# Patient Record
Sex: Female | Born: 1940
Health system: Southern US, Community
[De-identification: ages and names within clinical notes are randomized; demographics above are authoritative.]

## PROBLEM LIST (undated history)

## (undated) DIAGNOSIS — R011 Cardiac murmur, unspecified: Secondary | ICD-10-CM

## (undated) DIAGNOSIS — J189 Pneumonia, unspecified organism: Secondary | ICD-10-CM

## (undated) DIAGNOSIS — T7840XA Allergy, unspecified, initial encounter: Secondary | ICD-10-CM

## (undated) DIAGNOSIS — N189 Chronic kidney disease, unspecified: Secondary | ICD-10-CM

## (undated) DIAGNOSIS — E119 Type 2 diabetes mellitus without complications: Secondary | ICD-10-CM

## (undated) DIAGNOSIS — Z86010 Personal history of colonic polyps: Principal | ICD-10-CM

## (undated) DIAGNOSIS — T884XXA Failed or difficult intubation, initial encounter: Secondary | ICD-10-CM

## (undated) DIAGNOSIS — K219 Gastro-esophageal reflux disease without esophagitis: Secondary | ICD-10-CM

## (undated) DIAGNOSIS — M1990 Unspecified osteoarthritis, unspecified site: Secondary | ICD-10-CM

## (undated) DIAGNOSIS — I10 Essential (primary) hypertension: Secondary | ICD-10-CM

## (undated) DIAGNOSIS — E785 Hyperlipidemia, unspecified: Secondary | ICD-10-CM

## (undated) DIAGNOSIS — G709 Myoneural disorder, unspecified: Secondary | ICD-10-CM

## (undated) HISTORY — PX: POLYPECTOMY: SHX149

## (undated) HISTORY — DX: Allergy, unspecified, initial encounter: T78.40XA

## (undated) HISTORY — PX: ABDOMINAL HYSTERECTOMY: SHX81

## (undated) HISTORY — PX: TONSILLECTOMY: SUR1361

## (undated) HISTORY — PX: APPENDECTOMY: SHX54

## (undated) HISTORY — PX: BREAST EXCISIONAL BIOPSY: SUR124

## (undated) HISTORY — DX: Type 2 diabetes mellitus without complications: E11.9

## (undated) HISTORY — PX: BREAST SURGERY: SHX581

## (undated) HISTORY — PX: JOINT REPLACEMENT: SHX530

## (undated) HISTORY — DX: Personal history of colonic polyps: Z86.010

## (undated) HISTORY — DX: Unspecified osteoarthritis, unspecified site: M19.90

## (undated) HISTORY — DX: Hyperlipidemia, unspecified: E78.5

## (undated) HISTORY — DX: Essential (primary) hypertension: I10

---

## 1998-09-26 ENCOUNTER — Ambulatory Visit (HOSPITAL_COMMUNITY): Admission: RE | Admit: 1998-09-26 | Discharge: 1998-09-26 | Payer: Self-pay | Admitting: Obstetrics and Gynecology

## 1999-02-09 ENCOUNTER — Ambulatory Visit (HOSPITAL_COMMUNITY): Admission: RE | Admit: 1999-02-09 | Discharge: 1999-02-09 | Payer: Self-pay | Admitting: Cardiovascular Disease

## 2000-02-15 ENCOUNTER — Other Ambulatory Visit: Admission: RE | Admit: 2000-02-15 | Discharge: 2000-02-15 | Payer: Self-pay | Admitting: *Deleted

## 2000-09-03 ENCOUNTER — Ambulatory Visit (HOSPITAL_BASED_OUTPATIENT_CLINIC_OR_DEPARTMENT_OTHER): Admission: RE | Admit: 2000-09-03 | Discharge: 2000-09-03 | Payer: Self-pay | Admitting: Otolaryngology

## 2001-01-14 ENCOUNTER — Encounter: Payer: Self-pay | Admitting: Obstetrics and Gynecology

## 2001-01-14 ENCOUNTER — Ambulatory Visit (HOSPITAL_COMMUNITY): Admission: RE | Admit: 2001-01-14 | Discharge: 2001-01-14 | Payer: Self-pay | Admitting: Obstetrics and Gynecology

## 2001-08-04 ENCOUNTER — Ambulatory Visit (HOSPITAL_COMMUNITY): Admission: RE | Admit: 2001-08-04 | Discharge: 2001-08-04 | Payer: Self-pay | Admitting: Cardiovascular Disease

## 2001-08-04 ENCOUNTER — Encounter: Payer: Self-pay | Admitting: Cardiovascular Disease

## 2001-08-13 ENCOUNTER — Other Ambulatory Visit: Admission: RE | Admit: 2001-08-13 | Discharge: 2001-08-13 | Payer: Self-pay | Admitting: Obstetrics and Gynecology

## 2003-07-20 ENCOUNTER — Ambulatory Visit (HOSPITAL_COMMUNITY): Admission: RE | Admit: 2003-07-20 | Discharge: 2003-07-20 | Payer: Self-pay | Admitting: Obstetrics

## 2003-07-20 ENCOUNTER — Encounter: Payer: Self-pay | Admitting: Obstetrics

## 2004-07-16 ENCOUNTER — Ambulatory Visit (HOSPITAL_COMMUNITY): Admission: RE | Admit: 2004-07-16 | Discharge: 2004-07-16 | Payer: Self-pay | Admitting: Cardiovascular Disease

## 2005-02-11 ENCOUNTER — Ambulatory Visit (HOSPITAL_COMMUNITY): Admission: RE | Admit: 2005-02-11 | Discharge: 2005-02-11 | Payer: Self-pay | Admitting: Obstetrics

## 2006-04-30 ENCOUNTER — Ambulatory Visit (HOSPITAL_COMMUNITY): Admission: RE | Admit: 2006-04-30 | Discharge: 2006-04-30 | Payer: Self-pay | Admitting: Obstetrics

## 2008-03-21 ENCOUNTER — Encounter: Admission: RE | Admit: 2008-03-21 | Discharge: 2008-03-21 | Payer: Self-pay | Admitting: Cardiovascular Disease

## 2008-04-19 ENCOUNTER — Telehealth: Payer: Self-pay | Admitting: Internal Medicine

## 2008-04-27 ENCOUNTER — Ambulatory Visit (HOSPITAL_COMMUNITY): Admission: RE | Admit: 2008-04-27 | Discharge: 2008-04-27 | Payer: Self-pay | Admitting: Obstetrics & Gynecology

## 2008-05-19 ENCOUNTER — Ambulatory Visit: Payer: Self-pay | Admitting: Internal Medicine

## 2008-05-26 ENCOUNTER — Ambulatory Visit: Payer: Self-pay | Admitting: Internal Medicine

## 2008-05-26 ENCOUNTER — Encounter: Payer: Self-pay | Admitting: Internal Medicine

## 2008-05-26 DIAGNOSIS — Z86010 Personal history of colonic polyps: Secondary | ICD-10-CM

## 2008-05-26 DIAGNOSIS — Z860101 Personal history of adenomatous and serrated colon polyps: Secondary | ICD-10-CM

## 2008-05-26 HISTORY — DX: Personal history of colonic polyps: Z86.010

## 2008-05-26 HISTORY — DX: Personal history of adenomatous and serrated colon polyps: Z86.0101

## 2008-05-31 ENCOUNTER — Encounter: Payer: Self-pay | Admitting: Internal Medicine

## 2008-06-07 ENCOUNTER — Encounter: Admission: RE | Admit: 2008-06-07 | Discharge: 2008-06-07 | Payer: Self-pay | Admitting: Cardiovascular Disease

## 2009-05-19 ENCOUNTER — Encounter (INDEPENDENT_AMBULATORY_CARE_PROVIDER_SITE_OTHER): Payer: Self-pay | Admitting: *Deleted

## 2009-05-30 ENCOUNTER — Ambulatory Visit (HOSPITAL_COMMUNITY): Admission: RE | Admit: 2009-05-30 | Discharge: 2009-05-30 | Payer: Self-pay | Admitting: Cardiovascular Disease

## 2009-06-05 ENCOUNTER — Encounter: Admission: RE | Admit: 2009-06-05 | Discharge: 2009-06-05 | Payer: Self-pay | Admitting: Cardiovascular Disease

## 2009-06-08 ENCOUNTER — Ambulatory Visit: Payer: Self-pay | Admitting: Internal Medicine

## 2009-06-21 ENCOUNTER — Ambulatory Visit: Payer: Self-pay | Admitting: Internal Medicine

## 2010-06-07 ENCOUNTER — Encounter: Admission: RE | Admit: 2010-06-07 | Discharge: 2010-06-07 | Payer: Self-pay | Admitting: Obstetrics & Gynecology

## 2010-11-06 ENCOUNTER — Encounter: Payer: Self-pay | Admitting: Internal Medicine

## 2010-11-07 ENCOUNTER — Telehealth (INDEPENDENT_AMBULATORY_CARE_PROVIDER_SITE_OTHER): Payer: Self-pay | Admitting: *Deleted

## 2010-12-20 NOTE — Progress Notes (Signed)
  Phone Note Other Incoming   Request: Send information Summary of Call: Request received from [facility] forwarded to [facility].       

## 2011-01-29 ENCOUNTER — Other Ambulatory Visit: Payer: Self-pay | Admitting: Cardiovascular Disease

## 2011-01-29 DIAGNOSIS — M542 Cervicalgia: Secondary | ICD-10-CM

## 2011-02-01 ENCOUNTER — Ambulatory Visit
Admission: RE | Admit: 2011-02-01 | Discharge: 2011-02-01 | Disposition: A | Discharge status: Home / Self Care | Payer: Medicare Other | Source: Ambulatory Visit | Attending: Cardiovascular Disease | Admitting: Cardiovascular Disease

## 2011-02-01 DIAGNOSIS — M542 Cervicalgia: Secondary | ICD-10-CM

## 2011-04-03 ENCOUNTER — Ambulatory Visit: Payer: Medicare Other | Attending: Neurosurgery | Admitting: Rehabilitative and Restorative Service Providers"

## 2011-04-03 DIAGNOSIS — M542 Cervicalgia: Secondary | ICD-10-CM | POA: Insufficient documentation

## 2011-04-03 DIAGNOSIS — IMO0001 Reserved for inherently not codable concepts without codable children: Secondary | ICD-10-CM | POA: Insufficient documentation

## 2011-04-09 ENCOUNTER — Ambulatory Visit: Payer: Medicare Other | Admitting: Physical Therapy

## 2011-04-12 ENCOUNTER — Encounter: Payer: Medicare Other | Admitting: Rehabilitative and Restorative Service Providers"

## 2011-04-23 ENCOUNTER — Encounter: Payer: Medicare Other | Admitting: Physical Therapy

## 2011-05-13 NOTE — Miscellaneous (Signed)
Summary: PT CHANGED GI DR  Per Medical Records, patient has changed GI Dr's as per Release of Information Request. Dottie Nelson-Smith CMA Duncan Dull)  November 06, 2010 10:08 AM

## 2011-05-24 ENCOUNTER — Other Ambulatory Visit: Payer: Self-pay | Admitting: Cardiovascular Disease

## 2011-05-24 DIAGNOSIS — Z1231 Encounter for screening mammogram for malignant neoplasm of breast: Secondary | ICD-10-CM

## 2011-06-11 ENCOUNTER — Ambulatory Visit
Admission: RE | Admit: 2011-06-11 | Discharge: 2011-06-11 | Disposition: A | Discharge status: Home / Self Care | Payer: Medicare Other | Source: Ambulatory Visit | Attending: Cardiovascular Disease | Admitting: Cardiovascular Disease

## 2011-06-11 DIAGNOSIS — Z1231 Encounter for screening mammogram for malignant neoplasm of breast: Secondary | ICD-10-CM

## 2012-06-03 ENCOUNTER — Other Ambulatory Visit: Payer: Self-pay | Admitting: Cardiovascular Disease

## 2012-06-03 DIAGNOSIS — Z1231 Encounter for screening mammogram for malignant neoplasm of breast: Secondary | ICD-10-CM

## 2012-06-23 ENCOUNTER — Ambulatory Visit
Admission: RE | Admit: 2012-06-23 | Discharge: 2012-06-23 | Disposition: A | Discharge status: Home / Self Care | Payer: Medicare Other | Source: Ambulatory Visit | Attending: Cardiovascular Disease | Admitting: Cardiovascular Disease

## 2012-06-23 DIAGNOSIS — Z1231 Encounter for screening mammogram for malignant neoplasm of breast: Secondary | ICD-10-CM

## 2012-08-03 ENCOUNTER — Ambulatory Visit (INDEPENDENT_AMBULATORY_CARE_PROVIDER_SITE_OTHER): Payer: Medicare Other | Admitting: Emergency Medicine

## 2012-08-03 VITALS — BP 187/96 | HR 106 | Temp 97.8°F | Resp 18 | Ht 66.0 in | Wt 165.0 lb

## 2012-08-03 DIAGNOSIS — I10 Essential (primary) hypertension: Secondary | ICD-10-CM

## 2012-08-03 DIAGNOSIS — Z23 Encounter for immunization: Secondary | ICD-10-CM

## 2012-08-03 DIAGNOSIS — S91109A Unspecified open wound of unspecified toe(s) without damage to nail, initial encounter: Secondary | ICD-10-CM

## 2012-08-03 DIAGNOSIS — M25549 Pain in joints of unspecified hand: Secondary | ICD-10-CM

## 2012-08-03 NOTE — Progress Notes (Signed)
  Subjective:    Patient ID: Felicia Shelton, female    DOB: [birth year], 71 y.o.   MRN: [id number]  HPI patient enters with pain and swelling in the right great toe. She was helping move furniture and caught the nail plate beneath the bottom of a piece of furniture and pulled the nail plate backward    Review of Systems     Objective:   Physical Exam the nail plate of the right great toe was pulled backwards but still attached at the base. There is no swelling of the toe itself. There is no active bleeding the        Assessment & Plan:  She has a partial avulsion of the nail plate of the right great toe. We'll go ahead and remove the nail plate. Tetanus will be updated as a DT booster.

## 2012-08-03 NOTE — Patient Instructions (Signed)
Patient to take your blood pressure medication when you get home. Please be sure you followup with your regular doctor that her blood pressure.

## 2012-08-03 NOTE — Progress Notes (Signed)
   Patient ID: Felicia Shelton MRN: [id number], DOB: 01/31/1941, 71 y.o. Date of Encounter: 08/03/2012, 4:01 PM   PROCEDURE NOTE: Verbal consent obtained. Sterile technique employed. Numbing: Anesthesia obtained with 1:1 ratio 2% plain lidocaine with 0.5% Marcaine  Betadine prep per usual protocol.  Total nail already lifted due to the nature outside of the proximal nail. Proximal nail loosened and easily removed. Proximal aspect of nail bed explored revealing no nail remnants. Irrigated. Xeroform dressing applied. Wound care instructions including precautions covered with patient. Handout given.   SignedEula Listen, PA-C 08/03/2012 4:01 PM

## 2012-08-04 DIAGNOSIS — I10 Essential (primary) hypertension: Secondary | ICD-10-CM | POA: Insufficient documentation

## 2012-09-04 ENCOUNTER — Other Ambulatory Visit: Payer: Self-pay | Admitting: Gastroenterology

## 2012-09-09 ENCOUNTER — Ambulatory Visit
Admission: RE | Admit: 2012-09-09 | Discharge: 2012-09-09 | Disposition: A | Discharge status: Home / Self Care | Payer: Medicare Other | Source: Ambulatory Visit | Attending: Cardiovascular Disease | Admitting: Cardiovascular Disease

## 2012-09-09 ENCOUNTER — Other Ambulatory Visit: Payer: Self-pay | Admitting: Cardiovascular Disease

## 2012-09-09 DIAGNOSIS — R52 Pain, unspecified: Secondary | ICD-10-CM

## 2012-09-09 DIAGNOSIS — M1990 Unspecified osteoarthritis, unspecified site: Secondary | ICD-10-CM

## 2013-01-24 ENCOUNTER — Ambulatory Visit (INDEPENDENT_AMBULATORY_CARE_PROVIDER_SITE_OTHER): Payer: Medicare Other | Admitting: Family Medicine

## 2013-01-24 VITALS — BP 126/78 | HR 70 | Temp 97.9°F | Resp 16 | Ht 65.5 in | Wt 159.4 lb

## 2013-01-24 DIAGNOSIS — J309 Allergic rhinitis, unspecified: Secondary | ICD-10-CM

## 2013-01-24 MED ORDER — FLUTICASONE PROPIONATE 50 MCG/ACT NA SUSP: 2.0000 | Freq: Every day | NASAL | Status: DC

## 2013-01-24 NOTE — Progress Notes (Signed)
Subjective: 72 year old lady with a history of fairly recently been diagnosed as being diabetic. Her sugars are coming down as they increase her medications. She's been having recently had congestion and a lot of sneezing. She is not particularly ill. She's not coughing much. She's not feverish.  Objective: Pleasant alert lady in no major distress. TMs normal. Sinuses not not particularly tender. Nose slightly inflamed. Throat was clear. Neck supple without significant nodes. Chest is clear to auscultation. Since being diagnosed as being diabetic patient has been working on getting herself off of cigarettes, and is down to one or 2 cigarettes a day. She was urged to discontinue these.  Assessment: Allergic rhinitis  Plan: Continue to quit the cigarettes. Allegra Nasal saline or Nettie pot Fluticasone spray

## 2013-01-24 NOTE — Progress Notes (Signed)
  Subjective:    Patient ID: Felicia Shelton, female    DOB: 05-Feb-1941, 72 y.o.   MRN: [id number]  HPI    Review of Systems     Objective:   Physical Exam        Assessment & Plan:

## 2013-01-24 NOTE — Patient Instructions (Addendum)
Drink lots of fluids  Use the fluticasone spray 2 sprays twice daily for 3 days, then cut back to once daily  Use saline spray or Nettie pot as needed  Take over-the-counter or Allegra  Return if worse

## 2013-03-03 ENCOUNTER — Encounter: Payer: Self-pay | Admitting: Dietician

## 2013-03-03 ENCOUNTER — Encounter: Payer: Medicare Other | Attending: Cardiovascular Disease | Admitting: Dietician

## 2013-03-03 VITALS — Ht 65.5 in | Wt 152.7 lb

## 2013-03-03 DIAGNOSIS — Z713 Dietary counseling and surveillance: Secondary | ICD-10-CM | POA: Insufficient documentation

## 2013-03-03 DIAGNOSIS — E119 Type 2 diabetes mellitus without complications: Secondary | ICD-10-CM | POA: Insufficient documentation

## 2013-03-03 NOTE — Progress Notes (Signed)
Medical Nutrition Therapy:  Appt start time: 1115end time:  1150.   Assessment:  Primary concerns today: Came to know what to eat, if I can eat any carbs.  Currently lean meat and non-starchy vegetables ans losing weight.  Has lost approximately 8-10 lb since her diagnosis.  Her current weight is at 152 lb with a BMI of 25.0 kg/m2.  She expresses a desire to not lose any more weight. Does agree that weight loss to high 140's for the sake of better blood glucose would be OK.  Current A1C is at 6.9% on (12/11/2012).     MEDICATIONS: Med review completed.  Using Metformin 250 mg 3 times a day for blood glucose control.  BLOOD GLUCOSE MONITORING:  Daily fasting levels.  Fasting: 92, 95, 116- 129, 132   (Notes she has discovered that her evening meal can influence her fasting glucose levels)  DAILY FOOT SELF-EXAM:  Daily examines with her shower.  Review of the things to look for.  DILATED EYE EXAM:  No  HYPERGLYCEMIA:  No S/S noted.  HYPOGLYCEMIA:  No S/S noted.   DIETARY INTAKE:  Usual eating pattern includes 3 meals and 1 snacks per day (requesting snacks and snack suggestions).  Everyday foods include protein, non-starchy vegetables and more complex starches.  Avoided foods include sugary beverages and concentrated sweets, bread, pasta, rice.    24-hr recall:  B ( AM): 9:00-9:30  Oatmeal (instant with no sugar or less sugar) and a banana.  OR hamburger (1/2)  with brown bread (1 slice) unsweetened tea or water  Snk ( AM): none  L ( PM): 12-1:00 hotdog all the way, chili, slaw, mustard, OR [employer] Coldcut Combo, on wheat bread.  Tea, or diet soda. Snk ( PM): none D ( PM): 5:00 baked chicken and broccoli and salad and broccoli and another veggie Snk ( PM): none Beverages: water, unsweetened tea, diet soda.  Usual physical activity: none   Estimated energy needs:  HT: 65.5 in   WT: 152.1 lb  BMI: 25.0 kg/m2  Adj WT: 135 lb  (62) 1300-1400 calories 150-155 g carbohydrates 100-105 g  protein 36-38 g fat  Progress Towards Goal(s):  In progress.   Nutritional Diagnosis:  Slater-2.1 Inpaired nutrition utilization As related to glucose.  As evidenced by diagnosis of type 2 diabetes, A1C at 6.9%, fasting glucose > 126 mg at times, Metformin to assist with glucose control..    Intervention: Nutrition .   Try to get in the 150 minutes of activity each week.  Walking is the best.  Aim to start low and slow and work up to the 150 minutes.  Can be done outside or on your treadmill. Start to read food labels. Aim for fiber in most of your foods.  Look to use the whole grains.  Needs to list whole wheat or other whole grain flour in the ingredient list.  Aim for 2 gm of fiber per slice of whole grain breads. Aim to keep the sugar content on the label at (0-9 gm per serving) for the majority of the day.  Try measuring your carb servings.  Try to use the beans, peas, lentils, sweet potato.  Rice, pasta, white potatoes and white breads go to glucose relatively quickly. Have only one starch at a meal.  Plan for a protein and 1-2 servings of the non-starchy vegetables for the meal. Use the lean proteins at all meals and snacks. Serving size at size of the palm of the hand and for  snacks about 1-2 oz.  Use the www.calorieking.com web site for assistance with food labels and carb counting.  Especially helpful when dining out. Keep the added fat servings to 1-2 added fat servings per meal. Use your food label for serving size and plan to measure Aim for a 5% (7 lb) weight loss over the next 4-6 months.  Continue to daily check your feet.  Get the dilated eye exam.  Experiment with checking the after meals blood glucose levels.  Try to aim for the 30gm of carb for meals and the snacks at 15 gm plus your protein and and small servings of added fat. Continue to omit the sweetened beverages. Continue to use the Splenda in moderation as you are doing.   GOAL:  "Control blood glucose and take no  medications and have as few complications as possible."  Handouts given during visit include:  Living Well with Diabetes  Novo Nordisk Carb Counting Guide  Controlling Blood Glucose  Yellow Card with Diet Prescription and Carb Exchange List  30 gm CHO menu   Monitoring/Evaluation:  Dietary intake, exercise, blood glucose, and body weight in 8-12 weeks.

## 2013-03-04 ENCOUNTER — Encounter: Payer: Self-pay | Admitting: Dietician

## 2013-05-17 ENCOUNTER — Other Ambulatory Visit: Payer: Self-pay

## 2013-05-17 DIAGNOSIS — Z1231 Encounter for screening mammogram for malignant neoplasm of breast: Secondary | ICD-10-CM

## 2013-06-24 ENCOUNTER — Ambulatory Visit
Admission: RE | Admit: 2013-06-24 | Discharge: 2013-06-24 | Disposition: A | Discharge status: Home / Self Care | Payer: Medicare Other | Source: Ambulatory Visit

## 2013-06-24 DIAGNOSIS — Z1231 Encounter for screening mammogram for malignant neoplasm of breast: Secondary | ICD-10-CM

## 2014-04-19 ENCOUNTER — Ambulatory Visit (INDEPENDENT_AMBULATORY_CARE_PROVIDER_SITE_OTHER): Payer: Medicare Other

## 2014-04-19 VITALS — BP 141/77 | HR 72 | Resp 16

## 2014-04-19 DIAGNOSIS — B351 Tinea unguium: Secondary | ICD-10-CM

## 2014-04-19 DIAGNOSIS — E1142 Type 2 diabetes mellitus with diabetic polyneuropathy: Secondary | ICD-10-CM

## 2014-04-19 DIAGNOSIS — E1149 Type 2 diabetes mellitus with other diabetic neurological complication: Secondary | ICD-10-CM

## 2014-04-19 DIAGNOSIS — E1140 Type 2 diabetes mellitus with diabetic neuropathy, unspecified: Secondary | ICD-10-CM

## 2014-04-19 DIAGNOSIS — M79609 Pain in unspecified limb: Secondary | ICD-10-CM

## 2014-04-19 NOTE — Patient Instructions (Addendum)
Diabetes and Foot Care Diabetes may cause you to have problems because of poor blood supply (circulation) to your feet and legs. This may cause the skin on your feet to become thinner, break easier, and heal more slowly. Your skin may become dry, and the skin may peel and crack. You may also have nerve damage in your legs and feet causing decreased feeling in them. You may not notice minor injuries to your feet that could lead to infections or more serious problems. Taking care of your feet is one of the most important things you can do for yourself.  HOME CARE INSTRUCTIONS  Wear shoes at all times, even in the house. Do not go barefoot. Bare feet are easily injured.  Check your feet daily for blisters, cuts, and redness. If you cannot see the bottom of your feet, use a mirror or ask someone for help.  Wash your feet with warm water (do not use hot water) and mild soap. Then pat your feet and the areas between your toes until they are completely dry. Do not soak your feet as this can dry your skin.  Apply a moisturizing lotion or petroleum jelly (that does not contain alcohol and is unscented) to the skin on your feet and to dry, brittle toenails. Do not apply lotion between your toes.  Trim your toenails straight across. Do not dig under them or around the cuticle. File the edges of your nails with an emery board or nail file.  Do not cut corns or calluses or try to remove them with medicine.  Wear clean socks or stockings every day. Make sure they are not too tight. Do not wear knee-high stockings since they may decrease blood flow to your legs.  Wear shoes that fit properly and have enough cushioning. To break in new shoes, wear them for just a few hours a day. This prevents you from injuring your feet. Always look in your shoes before you put them on to be sure there are no objects inside.  Do not cross your legs. This may decrease the blood flow to your feet.  If you find a minor scrape,  cut, or break in the skin on your feet, keep it and the skin around it clean and dry. These areas may be cleansed with mild soap and water. Do not cleanse the area with peroxide, alcohol, or iodine.  When you remove an adhesive bandage, be sure not to damage the skin around it.  If you have a wound, look at it several times a day to make sure it is healing.  Do not use heating pads or hot water bottles. They may burn your skin. If you have lost feeling in your feet or legs, you may not know it is happening until it is too late.  Make sure your health care provider performs a complete foot exam at least annually or more often if you have foot problems. Report any cuts, sores, or bruises to your health care provider immediately. SEEK MEDICAL CARE IF:   You have an injury that is not healing.  You have cuts or breaks in the skin.  You have an ingrown nail.  You notice redness on your legs or feet.  You feel burning or tingling in your legs or feet.  You have pain or cramps in your legs and feet.  Your legs or feet are numb.  Your feet always feel cold. SEEK IMMEDIATE MEDICAL CARE IF:   There is increasing redness,   swelling, or pain in or around a wound.  There is a red line that goes up your leg.  Pus is coming from a wound.  You develop a fever or as directed by your health care provider.  You notice a bad smell coming from an ulcer or wound. Document Released: 11/01/2000 Document Revised: 07/07/2013 Document Reviewed: 04/13/2013 [facility] Patient Information 2014 [place].   For the fungus nails to help maintain improvement use over-the-counter Fungi-Nail which can be obtained at any drugstore apply once or twice monthly as a preventative agent

## 2014-04-19 NOTE — Progress Notes (Signed)
   Subjective:    Patient ID: Felicia Shelton, female    DOB: 09-28-1941, 73 y.o.   MRN: [id number]  HPI Comments: "I just want this toenail checked and have the toenails cut. The toe kind of hurts"  Follow up 1st toenail right and trim the toenails     Review of Systems  All other systems reviewed and are negative.      Objective:   Physical Exam Neurovascular status is intact although diminished DP postal for PT plus one over 4 bilateral capillary refill time 3 seconds all digits epicritic and proprioceptive sensations intact and symmetric although decreased on Semmes Weinstein testing to the forefoot and plantar arch. Nails both hallux show some improvement has been using topical antifungal in the past obstetric suggested over-the-counter Fungi-Nail be applied to the affected nails as a preventative measure both nails show improvement no signs of significant fungus no exacerbation no secondary infection is noted patient requesting for diabetic foot and nail care at this time also suggested a proper shoe gear no slip on shoes no flip-flops no barefoot or spur socks at all times       Assessment & Plan:  Assessment resolving onychomycosis hallux nails thick brittle crumbly friable criptotic nails 1 through 4 bilateral debridement presence of diabetes cocking factors as well as onychomycosis and proptosis incurvation of nails maintain shoes with accommodation of her toes avoid entire constrictive on her toes maybe getting some contusions for some of her shoes wearing slip on shoes or not diabetic appropriate reappointed 3 months for followup and continued palliative care in the future as needed  Harriet Masson DPM

## 2014-06-02 ENCOUNTER — Other Ambulatory Visit: Payer: Self-pay

## 2014-06-02 DIAGNOSIS — Z1231 Encounter for screening mammogram for malignant neoplasm of breast: Secondary | ICD-10-CM

## 2014-06-28 ENCOUNTER — Ambulatory Visit
Admission: RE | Admit: 2014-06-28 | Discharge: 2014-06-28 | Disposition: A | Discharge status: Home / Self Care | Payer: Medicare Other | Source: Ambulatory Visit

## 2014-06-28 DIAGNOSIS — Z1231 Encounter for screening mammogram for malignant neoplasm of breast: Secondary | ICD-10-CM

## 2014-08-01 ENCOUNTER — Encounter: Payer: Self-pay | Admitting: Internal Medicine

## 2014-09-05 ENCOUNTER — Other Ambulatory Visit: Payer: Self-pay | Admitting: Orthopedic Surgery

## 2014-09-05 DIAGNOSIS — M79605 Pain in left leg: Secondary | ICD-10-CM

## 2014-09-05 DIAGNOSIS — M5416 Radiculopathy, lumbar region: Secondary | ICD-10-CM

## 2014-09-15 ENCOUNTER — Ambulatory Visit
Admission: RE | Admit: 2014-09-15 | Discharge: 2014-09-15 | Disposition: A | Discharge status: Home / Self Care | Payer: Medicare Other | Source: Ambulatory Visit | Attending: Orthopedic Surgery | Admitting: Orthopedic Surgery

## 2014-09-15 DIAGNOSIS — M79605 Pain in left leg: Secondary | ICD-10-CM

## 2014-09-15 DIAGNOSIS — M5416 Radiculopathy, lumbar region: Secondary | ICD-10-CM

## 2015-06-19 ENCOUNTER — Other Ambulatory Visit: Payer: Self-pay

## 2015-06-19 DIAGNOSIS — Z1231 Encounter for screening mammogram for malignant neoplasm of breast: Secondary | ICD-10-CM

## 2015-07-26 ENCOUNTER — Ambulatory Visit
Admission: RE | Admit: 2015-07-26 | Discharge: 2015-07-26 | Disposition: A | Discharge status: Home / Self Care | Payer: Medicare Other | Source: Ambulatory Visit

## 2015-07-26 DIAGNOSIS — Z1231 Encounter for screening mammogram for malignant neoplasm of breast: Secondary | ICD-10-CM

## 2015-07-28 ENCOUNTER — Other Ambulatory Visit: Payer: Self-pay | Admitting: Cardiovascular Disease

## 2015-07-28 DIAGNOSIS — R928 Other abnormal and inconclusive findings on diagnostic imaging of breast: Secondary | ICD-10-CM

## 2015-08-03 ENCOUNTER — Ambulatory Visit
Admission: RE | Admit: 2015-08-03 | Discharge: 2015-08-03 | Disposition: A | Discharge status: Home / Self Care | Payer: Medicare Other | Source: Ambulatory Visit | Attending: Cardiovascular Disease | Admitting: Cardiovascular Disease

## 2015-08-03 ENCOUNTER — Telehealth: Payer: Self-pay | Admitting: Internal Medicine

## 2015-08-03 DIAGNOSIS — R928 Other abnormal and inconclusive findings on diagnostic imaging of breast: Secondary | ICD-10-CM

## 2015-08-11 ENCOUNTER — Encounter: Payer: Self-pay | Admitting: Internal Medicine

## 2015-08-31 ENCOUNTER — Ambulatory Visit: Payer: Medicare Other | Admitting: Podiatry

## 2015-09-27 ENCOUNTER — Ambulatory Visit (AMBULATORY_SURGERY_CENTER): Payer: Self-pay | Admitting: *Deleted

## 2015-09-27 VITALS — Ht 66.0 in | Wt 168.0 lb

## 2015-09-27 DIAGNOSIS — Z86010 Personal history of colonic polyps: Secondary | ICD-10-CM

## 2015-09-27 NOTE — Progress Notes (Signed)
No egg or soy allergy  No anesthesia or intubation problems per pt  No diet medications taken  Registered in [facility]   

## 2015-10-05 ENCOUNTER — Encounter: Payer: Self-pay | Admitting: Internal Medicine

## 2015-10-11 ENCOUNTER — Encounter: Payer: Self-pay | Admitting: Internal Medicine

## 2015-10-11 ENCOUNTER — Ambulatory Visit (AMBULATORY_SURGERY_CENTER): Payer: Medicare Other | Admitting: Internal Medicine

## 2015-10-11 VITALS — BP 139/64 | HR 67 | Temp 97.8°F | Resp 32 | Ht 66.0 in | Wt 168.0 lb

## 2015-10-11 DIAGNOSIS — Z86010 Personal history of colonic polyps: Secondary | ICD-10-CM

## 2015-10-11 HISTORY — PX: COLONOSCOPY: SHX174

## 2015-10-11 MED ORDER — SODIUM CHLORIDE 0.9 % IV SOLN: 500.0000 mL | INTRAVENOUS | Status: DC

## 2015-10-11 MED ORDER — HYDROCORTISONE 2.5 % RE CREA: 1.0000 "application " | TOPICAL_CREAM | Freq: Two times a day (BID) | RECTAL | Status: DC | PRN

## 2015-10-11 NOTE — Progress Notes (Signed)
To recovery, report to Scott, RN, VSS 

## 2015-10-11 NOTE — Op Note (Signed)
[place]  [address] [place], [address]   COLONOSCOPY PROCEDURE REPORT  PATIENT: Felicia, Shelton  MR#: [id number] BIRTHDATE: 15-Aug-1941 , 73  yrs. old GENDER: female ENDOSCOPIST: Gatha Mayer, MD, [facility] PROCEDURE DATE:  10/11/2015 PROCEDURE:   Colonoscopy, surveillance First Screening Colonoscopy - Avg.  risk and is 50 yrs.  old or older - No.  Prior Negative Screening - Now for repeat screening. N/A  History of Adenoma - Now for follow-up colonoscopy & has been > or = to 3 yrs.  Yes hx of adenoma.  Has been 3 or more years since last colonoscopy.  Polyps removed today? No Recommend repeat exam, <10 yrs? Yes high risk ASA CLASS:   Class III INDICATIONS:Surveillance due to prior colonic neoplasia and PH Colon Adenoma. MEDICATIONS: Propofol 170 mg IV and Monitored anesthesia care  DESCRIPTION OF PROCEDURE:   After the risks benefits and alternatives of the procedure were thoroughly explained, informed consent was obtained.  The digital rectal exam revealed no abnormalities of the rectum.   The LB [id number]-[id number]  endoscope was introduced through the anus and advanced to the cecum, which was identified by both the appendix and ileocecal valve. No adverse events experienced.   The quality of the prep was excellent. (MiraLax was used)  The instrument was then slowly withdrawn as the colon was fully examined. Estimated blood loss is zero unless otherwise noted in this procedure report.      COLON FINDINGS: External and internal hemorrhoids were found.   The examination was otherwise normal.  Retroflexed views revealed internal hemorrhoids and Retroflexed views revealed external hemorrhoids. The time to cecum = 2.1 Withdrawal time = 8.1   The scope was withdrawn and the procedure completed. COMPLICATIONS: There were no immediate complications.  ENDOSCOPIC IMPRESSION: 1.   External and internal hemorrhoids 2.   The examination was otherwise  normal  RECOMMENDATIONS: Repeat Colonoscopy in 3 - 5 years. She may see me as needed to consider hemorrhoid banding if desired  eSigned:  Gatha Mayer, MD, [facility] 10/11/2015 10:22 AM   cc: The Patient and Dr. Jeanne Ivan

## 2015-10-11 NOTE — Patient Instructions (Addendum)
No polyps seen today. Repeat colonoscopy 3-5 years.  I refilled the hemorrhoid cream.  You may schedule an office appointment to discuss and consider rubber band treatment of your hemorrhoids if desired.  I appreciate the opportunity to care for you. Gatha Mayer, MD, [facility] FOLLOW DISCHARGE INSTRUCTIONS [facility] AND GREEN SHEETS).FOLLOW DISCHARGE INSTRUCTIONS (BLUE AND GREEN SHEETS).YOU HAD AN ENDOSCOPIC PROCEDURE TODAY AT [facility]:   Refer to the procedure report that was given to you for any specific questions about what was found during the examination.  If the procedure report does not answer your questions, please call your gastroenterologist to clarify.  If you requested that your care partner not be given the details of your procedure findings, then the procedure report has been included in a sealed envelope for you to review at your convenience later.  YOU SHOULD EXPECT: Some feelings of bloating in the abdomen. Passage of more gas than usual.  Walking can help get rid of the air that was put into your GI tract during the procedure and reduce the bloating. If you had a lower endoscopy (such as a colonoscopy or flexible sigmoidoscopy) you may notice spotting of blood in your stool or on the toilet paper. If you underwent a bowel prep for your procedure, you may not have a normal bowel movement for a few days.  Please Note:  You might notice some irritation and congestion in your nose or some drainage.  This is from the oxygen used during your procedure.  There is no need for concern and it should clear up in a day or so.  SYMPTOMS TO REPORT IMMEDIATELY:   Following lower endoscopy (colonoscopy or flexible sigmoidoscopy):  Excessive amounts of blood in the stool  Significant tenderness or worsening of abdominal pains  Swelling of the abdomen that is new, acute  Fever of 100F or higher   Following upper endoscopy (EGD)  Vomiting of blood or coffee ground  material  New chest pain or pain under the shoulder blades  Painful or persistently difficult swallowing  New shortness of breath  Fever of 100F or higher  Black, tarry-looking stools  For urgent or emergent issues, a gastroenterologist can be reached at any hour by calling [phone].   DIET: Your first meal following the procedure should be a small meal and then it is ok to progress to your normal diet. Heavy or fried foods are harder to digest and may make you feel nauseous or bloated.  Likewise, meals heavy in dairy and vegetables can increase bloating.  Drink plenty of fluids but you should avoid alcoholic beverages for 24 hours.  ACTIVITY:  You should plan to take it easy for the rest of today and you should NOT DRIVE or use heavy machinery until tomorrow (because of the sedation medicines used during the test).    FOLLOW UP: Our staff will call the number listed on your records the next business day following your procedure to check on you and address any questions or concerns that you may have regarding the information given to you following your procedure. If we do not reach you, we will leave a message.  However, if you are feeling well and you are not experiencing any problems, there is no need to return our call.  We will assume that you have returned to your regular daily activities without incident.  If any biopsies were taken you will be contacted by phone or by letter within the next 1-3  weeks.  Please call us at [phone] if you have not heard about the biopsies in 3 weeks.    SIGNATURES/CONFIDENTIALITY: You and/or your care partner have signed paperwork which will be entered into your electronic medical record.  These signatures attest to the fact that that the information above on your After Visit Summary has been reviewed and is understood.  Full responsibility of the confidentiality of this discharge information lies with you and/or your care-partner.  Hemorrhoid  information given.

## 2015-10-16 ENCOUNTER — Telehealth: Payer: Self-pay

## 2015-10-16 NOTE — Telephone Encounter (Signed)
  Follow up Call-  Call back number 10/11/2015  Post procedure Call Back phone  # [phone]  Permission to leave phone message Yes     Patient questions:  Do you have a fever, pain , or abdominal swelling? No. Pain Score  0 *  Have you tolerated food without any problems? Yes.    Have you been able to return to your normal activities? Yes.    Do you have any questions about your discharge instructions: Diet   No. Medications  No. Follow up visit  No.  Do you have questions or concerns about your Care? No.  Actions: * If pain score is 4 or above: No action needed, pain <4.

## 2015-10-24 NOTE — Telephone Encounter (Signed)
Pt was seen on 10-11-15

## 2015-11-22 ENCOUNTER — Encounter: Payer: Self-pay | Admitting: Internal Medicine

## 2016-01-23 ENCOUNTER — Other Ambulatory Visit: Payer: Self-pay | Admitting: Cardiovascular Disease

## 2016-01-23 DIAGNOSIS — N6320 Unspecified lump in the left breast, unspecified quadrant: Secondary | ICD-10-CM

## 2016-02-01 ENCOUNTER — Ambulatory Visit
Admission: RE | Admit: 2016-02-01 | Discharge: 2016-02-01 | Disposition: A | Discharge status: Home / Self Care | Payer: PPO | Source: Ambulatory Visit | Attending: Cardiovascular Disease | Admitting: Cardiovascular Disease

## 2016-02-01 DIAGNOSIS — N6320 Unspecified lump in the left breast, unspecified quadrant: Secondary | ICD-10-CM

## 2016-02-01 DIAGNOSIS — N63 Unspecified lump in breast: Secondary | ICD-10-CM | POA: Diagnosis not present

## 2016-02-08 DIAGNOSIS — M5030 Other cervical disc degeneration, unspecified cervical region: Secondary | ICD-10-CM | POA: Diagnosis not present

## 2016-02-08 DIAGNOSIS — E119 Type 2 diabetes mellitus without complications: Secondary | ICD-10-CM | POA: Diagnosis not present

## 2016-02-08 DIAGNOSIS — I10 Essential (primary) hypertension: Secondary | ICD-10-CM | POA: Diagnosis not present

## 2016-03-01 DIAGNOSIS — E559 Vitamin D deficiency, unspecified: Secondary | ICD-10-CM | POA: Diagnosis not present

## 2016-03-01 DIAGNOSIS — E1165 Type 2 diabetes mellitus with hyperglycemia: Secondary | ICD-10-CM | POA: Diagnosis not present

## 2016-03-01 DIAGNOSIS — E876 Hypokalemia: Secondary | ICD-10-CM | POA: Diagnosis not present

## 2016-03-01 DIAGNOSIS — D649 Anemia, unspecified: Secondary | ICD-10-CM | POA: Diagnosis not present

## 2016-03-14 DIAGNOSIS — L720 Epidermal cyst: Secondary | ICD-10-CM | POA: Diagnosis not present

## 2016-04-25 DIAGNOSIS — M4806 Spinal stenosis, lumbar region: Secondary | ICD-10-CM | POA: Diagnosis not present

## 2016-04-25 DIAGNOSIS — M5116 Intervertebral disc disorders with radiculopathy, lumbar region: Secondary | ICD-10-CM | POA: Diagnosis not present

## 2016-04-25 DIAGNOSIS — M545 Low back pain: Secondary | ICD-10-CM | POA: Diagnosis not present

## 2016-05-14 DIAGNOSIS — M4806 Spinal stenosis, lumbar region: Secondary | ICD-10-CM | POA: Diagnosis not present

## 2016-05-14 DIAGNOSIS — M545 Low back pain: Secondary | ICD-10-CM | POA: Diagnosis not present

## 2016-06-06 DIAGNOSIS — E119 Type 2 diabetes mellitus without complications: Secondary | ICD-10-CM | POA: Diagnosis not present

## 2016-06-06 DIAGNOSIS — M5030 Other cervical disc degeneration, unspecified cervical region: Secondary | ICD-10-CM | POA: Diagnosis not present

## 2016-06-06 DIAGNOSIS — I10 Essential (primary) hypertension: Secondary | ICD-10-CM | POA: Diagnosis not present

## 2016-07-05 ENCOUNTER — Other Ambulatory Visit: Payer: Self-pay | Admitting: Cardiovascular Disease

## 2016-07-05 DIAGNOSIS — N6320 Unspecified lump in the left breast, unspecified quadrant: Secondary | ICD-10-CM

## 2016-07-11 DIAGNOSIS — E119 Type 2 diabetes mellitus without complications: Secondary | ICD-10-CM | POA: Diagnosis not present

## 2016-07-23 DIAGNOSIS — E119 Type 2 diabetes mellitus without complications: Secondary | ICD-10-CM | POA: Diagnosis not present

## 2016-07-23 DIAGNOSIS — M5030 Other cervical disc degeneration, unspecified cervical region: Secondary | ICD-10-CM | POA: Diagnosis not present

## 2016-07-23 DIAGNOSIS — I10 Essential (primary) hypertension: Secondary | ICD-10-CM | POA: Diagnosis not present

## 2016-07-26 ENCOUNTER — Ambulatory Visit
Admission: RE | Admit: 2016-07-26 | Discharge: 2016-07-26 | Disposition: A | Discharge status: Home / Self Care | Payer: PPO | Source: Ambulatory Visit | Attending: Cardiovascular Disease | Admitting: Cardiovascular Disease

## 2016-07-26 ENCOUNTER — Other Ambulatory Visit: Payer: Self-pay | Admitting: Cardiovascular Disease

## 2016-07-26 DIAGNOSIS — N6320 Unspecified lump in the left breast, unspecified quadrant: Secondary | ICD-10-CM

## 2016-07-26 DIAGNOSIS — N63 Unspecified lump in breast: Secondary | ICD-10-CM | POA: Diagnosis not present

## 2016-08-07 ENCOUNTER — Other Ambulatory Visit: Payer: Self-pay | Admitting: Cardiovascular Disease

## 2016-08-07 DIAGNOSIS — N6320 Unspecified lump in the left breast, unspecified quadrant: Secondary | ICD-10-CM

## 2016-08-08 ENCOUNTER — Ambulatory Visit
Admission: RE | Admit: 2016-08-08 | Discharge: 2016-08-08 | Disposition: A | Discharge status: Home / Self Care | Payer: PPO | Source: Ambulatory Visit | Attending: Cardiovascular Disease | Admitting: Cardiovascular Disease

## 2016-08-08 DIAGNOSIS — N6320 Unspecified lump in the left breast, unspecified quadrant: Secondary | ICD-10-CM

## 2016-08-08 DIAGNOSIS — N63 Unspecified lump in breast: Secondary | ICD-10-CM | POA: Diagnosis not present

## 2016-08-08 DIAGNOSIS — N6012 Diffuse cystic mastopathy of left breast: Secondary | ICD-10-CM | POA: Diagnosis not present

## 2016-09-06 DIAGNOSIS — Z79899 Other long term (current) drug therapy: Secondary | ICD-10-CM | POA: Diagnosis not present

## 2016-09-06 DIAGNOSIS — D649 Anemia, unspecified: Secondary | ICD-10-CM | POA: Diagnosis not present

## 2016-09-06 DIAGNOSIS — E119 Type 2 diabetes mellitus without complications: Secondary | ICD-10-CM | POA: Diagnosis not present

## 2016-09-06 DIAGNOSIS — E785 Hyperlipidemia, unspecified: Secondary | ICD-10-CM | POA: Diagnosis not present

## 2016-09-26 DIAGNOSIS — M5030 Other cervical disc degeneration, unspecified cervical region: Secondary | ICD-10-CM | POA: Diagnosis not present

## 2016-09-26 DIAGNOSIS — E119 Type 2 diabetes mellitus without complications: Secondary | ICD-10-CM | POA: Diagnosis not present

## 2016-09-26 DIAGNOSIS — I10 Essential (primary) hypertension: Secondary | ICD-10-CM | POA: Diagnosis not present

## 2016-10-18 DIAGNOSIS — H1013 Acute atopic conjunctivitis, bilateral: Secondary | ICD-10-CM | POA: Diagnosis not present

## 2016-10-25 DIAGNOSIS — H1013 Acute atopic conjunctivitis, bilateral: Secondary | ICD-10-CM | POA: Diagnosis not present

## 2016-11-03 DIAGNOSIS — S92505A Nondisplaced unspecified fracture of left lesser toe(s), initial encounter for closed fracture: Secondary | ICD-10-CM | POA: Diagnosis not present

## 2016-11-26 DIAGNOSIS — M5030 Other cervical disc degeneration, unspecified cervical region: Secondary | ICD-10-CM | POA: Diagnosis not present

## 2016-11-26 DIAGNOSIS — I10 Essential (primary) hypertension: Secondary | ICD-10-CM | POA: Diagnosis not present

## 2016-11-26 DIAGNOSIS — E119 Type 2 diabetes mellitus without complications: Secondary | ICD-10-CM | POA: Diagnosis not present

## 2017-02-10 DIAGNOSIS — M5186 Other intervertebral disc disorders, lumbar region: Secondary | ICD-10-CM | POA: Diagnosis not present

## 2017-02-10 DIAGNOSIS — I2510 Atherosclerotic heart disease of native coronary artery without angina pectoris: Secondary | ICD-10-CM | POA: Diagnosis not present

## 2017-02-10 DIAGNOSIS — E119 Type 2 diabetes mellitus without complications: Secondary | ICD-10-CM | POA: Diagnosis not present

## 2017-02-10 DIAGNOSIS — I10 Essential (primary) hypertension: Secondary | ICD-10-CM | POA: Diagnosis not present

## 2017-03-26 DIAGNOSIS — D649 Anemia, unspecified: Secondary | ICD-10-CM | POA: Diagnosis not present

## 2017-03-26 DIAGNOSIS — E785 Hyperlipidemia, unspecified: Secondary | ICD-10-CM | POA: Diagnosis not present

## 2017-03-26 DIAGNOSIS — E559 Vitamin D deficiency, unspecified: Secondary | ICD-10-CM | POA: Diagnosis not present

## 2017-03-26 DIAGNOSIS — E119 Type 2 diabetes mellitus without complications: Secondary | ICD-10-CM | POA: Diagnosis not present

## 2017-03-26 DIAGNOSIS — E876 Hypokalemia: Secondary | ICD-10-CM | POA: Diagnosis not present

## 2017-03-26 DIAGNOSIS — Z79899 Other long term (current) drug therapy: Secondary | ICD-10-CM | POA: Diagnosis not present

## 2017-04-08 DIAGNOSIS — I10 Essential (primary) hypertension: Secondary | ICD-10-CM | POA: Diagnosis not present

## 2017-04-08 DIAGNOSIS — I2510 Atherosclerotic heart disease of native coronary artery without angina pectoris: Secondary | ICD-10-CM | POA: Diagnosis not present

## 2017-04-08 DIAGNOSIS — M5030 Other cervical disc degeneration, unspecified cervical region: Secondary | ICD-10-CM | POA: Diagnosis not present

## 2017-04-08 DIAGNOSIS — E1142 Type 2 diabetes mellitus with diabetic polyneuropathy: Secondary | ICD-10-CM | POA: Diagnosis not present

## 2017-04-16 DIAGNOSIS — E1142 Type 2 diabetes mellitus with diabetic polyneuropathy: Secondary | ICD-10-CM | POA: Diagnosis not present

## 2017-04-16 DIAGNOSIS — M5030 Other cervical disc degeneration, unspecified cervical region: Secondary | ICD-10-CM | POA: Diagnosis not present

## 2017-04-16 DIAGNOSIS — I10 Essential (primary) hypertension: Secondary | ICD-10-CM | POA: Diagnosis not present

## 2017-04-16 DIAGNOSIS — I2510 Atherosclerotic heart disease of native coronary artery without angina pectoris: Secondary | ICD-10-CM | POA: Diagnosis not present

## 2017-05-20 DIAGNOSIS — M5030 Other cervical disc degeneration, unspecified cervical region: Secondary | ICD-10-CM | POA: Diagnosis not present

## 2017-05-20 DIAGNOSIS — J0100 Acute maxillary sinusitis, unspecified: Secondary | ICD-10-CM | POA: Diagnosis not present

## 2017-05-20 DIAGNOSIS — I2510 Atherosclerotic heart disease of native coronary artery without angina pectoris: Secondary | ICD-10-CM | POA: Diagnosis not present

## 2017-05-20 DIAGNOSIS — R600 Localized edema: Secondary | ICD-10-CM | POA: Diagnosis not present

## 2017-05-20 DIAGNOSIS — I10 Essential (primary) hypertension: Secondary | ICD-10-CM | POA: Diagnosis not present

## 2017-05-20 DIAGNOSIS — E119 Type 2 diabetes mellitus without complications: Secondary | ICD-10-CM | POA: Diagnosis not present

## 2017-05-27 DIAGNOSIS — I2510 Atherosclerotic heart disease of native coronary artery without angina pectoris: Secondary | ICD-10-CM | POA: Diagnosis not present

## 2017-05-27 DIAGNOSIS — M5030 Other cervical disc degeneration, unspecified cervical region: Secondary | ICD-10-CM | POA: Diagnosis not present

## 2017-05-27 DIAGNOSIS — R600 Localized edema: Secondary | ICD-10-CM | POA: Diagnosis not present

## 2017-05-27 DIAGNOSIS — J0100 Acute maxillary sinusitis, unspecified: Secondary | ICD-10-CM | POA: Diagnosis not present

## 2017-05-27 DIAGNOSIS — E119 Type 2 diabetes mellitus without complications: Secondary | ICD-10-CM | POA: Diagnosis not present

## 2017-05-27 DIAGNOSIS — I10 Essential (primary) hypertension: Secondary | ICD-10-CM | POA: Diagnosis not present

## 2017-06-25 DIAGNOSIS — E1165 Type 2 diabetes mellitus with hyperglycemia: Secondary | ICD-10-CM | POA: Diagnosis not present

## 2017-06-25 DIAGNOSIS — E876 Hypokalemia: Secondary | ICD-10-CM | POA: Diagnosis not present

## 2017-06-25 DIAGNOSIS — D649 Anemia, unspecified: Secondary | ICD-10-CM | POA: Diagnosis not present

## 2017-07-01 DIAGNOSIS — R600 Localized edema: Secondary | ICD-10-CM | POA: Diagnosis not present

## 2017-07-01 DIAGNOSIS — I10 Essential (primary) hypertension: Secondary | ICD-10-CM | POA: Diagnosis not present

## 2017-07-01 DIAGNOSIS — E119 Type 2 diabetes mellitus without complications: Secondary | ICD-10-CM | POA: Diagnosis not present

## 2017-07-01 DIAGNOSIS — M5030 Other cervical disc degeneration, unspecified cervical region: Secondary | ICD-10-CM | POA: Diagnosis not present

## 2017-07-01 DIAGNOSIS — J0100 Acute maxillary sinusitis, unspecified: Secondary | ICD-10-CM | POA: Diagnosis not present

## 2017-07-01 DIAGNOSIS — I2510 Atherosclerotic heart disease of native coronary artery without angina pectoris: Secondary | ICD-10-CM | POA: Diagnosis not present

## 2017-07-07 ENCOUNTER — Other Ambulatory Visit: Payer: Self-pay | Admitting: Cardiovascular Disease

## 2017-07-07 DIAGNOSIS — Z1231 Encounter for screening mammogram for malignant neoplasm of breast: Secondary | ICD-10-CM

## 2017-07-15 DIAGNOSIS — J0100 Acute maxillary sinusitis, unspecified: Secondary | ICD-10-CM | POA: Diagnosis not present

## 2017-07-15 DIAGNOSIS — M5030 Other cervical disc degeneration, unspecified cervical region: Secondary | ICD-10-CM | POA: Diagnosis not present

## 2017-07-15 DIAGNOSIS — I10 Essential (primary) hypertension: Secondary | ICD-10-CM | POA: Diagnosis not present

## 2017-07-15 DIAGNOSIS — R600 Localized edema: Secondary | ICD-10-CM | POA: Diagnosis not present

## 2017-07-15 DIAGNOSIS — I2510 Atherosclerotic heart disease of native coronary artery without angina pectoris: Secondary | ICD-10-CM | POA: Diagnosis not present

## 2017-07-15 DIAGNOSIS — E119 Type 2 diabetes mellitus without complications: Secondary | ICD-10-CM | POA: Diagnosis not present

## 2017-07-28 ENCOUNTER — Ambulatory Visit
Admission: RE | Admit: 2017-07-28 | Discharge: 2017-07-28 | Disposition: A | Discharge status: Home / Self Care | Payer: PPO | Source: Ambulatory Visit | Attending: Cardiovascular Disease | Admitting: Cardiovascular Disease

## 2017-07-28 DIAGNOSIS — Z1231 Encounter for screening mammogram for malignant neoplasm of breast: Secondary | ICD-10-CM

## 2017-08-13 DIAGNOSIS — E119 Type 2 diabetes mellitus without complications: Secondary | ICD-10-CM | POA: Diagnosis not present

## 2017-11-25 DIAGNOSIS — M5030 Other cervical disc degeneration, unspecified cervical region: Secondary | ICD-10-CM | POA: Diagnosis not present

## 2017-11-25 DIAGNOSIS — E663 Overweight: Secondary | ICD-10-CM | POA: Diagnosis not present

## 2017-11-25 DIAGNOSIS — E119 Type 2 diabetes mellitus without complications: Secondary | ICD-10-CM | POA: Diagnosis not present

## 2017-11-25 DIAGNOSIS — I10 Essential (primary) hypertension: Secondary | ICD-10-CM | POA: Diagnosis not present

## 2018-02-24 DIAGNOSIS — E1140 Type 2 diabetes mellitus with diabetic neuropathy, unspecified: Secondary | ICD-10-CM | POA: Diagnosis not present

## 2018-02-24 DIAGNOSIS — M5030 Other cervical disc degeneration, unspecified cervical region: Secondary | ICD-10-CM | POA: Diagnosis not present

## 2018-02-24 DIAGNOSIS — E663 Overweight: Secondary | ICD-10-CM | POA: Diagnosis not present

## 2018-02-24 DIAGNOSIS — I10 Essential (primary) hypertension: Secondary | ICD-10-CM | POA: Diagnosis not present

## 2018-04-14 ENCOUNTER — Ambulatory Visit (INDEPENDENT_AMBULATORY_CARE_PROVIDER_SITE_OTHER): Payer: PPO | Admitting: Sports Medicine

## 2018-04-14 ENCOUNTER — Encounter: Payer: Self-pay | Admitting: Sports Medicine

## 2018-04-14 DIAGNOSIS — R0989 Other specified symptoms and signs involving the circulatory and respiratory systems: Secondary | ICD-10-CM

## 2018-04-14 DIAGNOSIS — B351 Tinea unguium: Secondary | ICD-10-CM | POA: Diagnosis not present

## 2018-04-14 DIAGNOSIS — M7989 Other specified soft tissue disorders: Secondary | ICD-10-CM

## 2018-04-14 DIAGNOSIS — I739 Peripheral vascular disease, unspecified: Secondary | ICD-10-CM

## 2018-04-14 DIAGNOSIS — E1142 Type 2 diabetes mellitus with diabetic polyneuropathy: Secondary | ICD-10-CM

## 2018-04-14 DIAGNOSIS — M79674 Pain in right toe(s): Secondary | ICD-10-CM

## 2018-04-14 DIAGNOSIS — M79675 Pain in left toe(s): Secondary | ICD-10-CM

## 2018-04-14 NOTE — Patient Instructions (Signed)

## 2018-04-14 NOTE — Progress Notes (Signed)
Subjective: Felicia Shelton is a 77 y.o. female patient with history of diabetes who presents to office today complaining of long,mildly painful nails  while ambulating in shoes; unable to trim and noticing discoloration at big toe nails has tried OTC some. Reports that sometimes her toes swell and that she was out on Gabapentin that helps but only for a few hours. Was diagnosed with Diabetes 3 years ago and these symptoms of swelling and burning has been getting worse over the last 6 months. Patient states that the glucose reading this morning was not recorded but last A1c was 6.5. Patient denies any other pedal complaints.  Review of Systems  Skin:       Discolored big toenails  Neurological: Positive for sensory change.  All other systems reviewed and are negative.    Patient Active Problem List   Diagnosis Date Noted  . Hypertension 08/04/2012  . Hx of adenomatous colonic polyp 05/26/2008   Current Outpatient Medications on File Prior to Visit  Medication Sig Dispense Refill  . amLODipine (NORVASC) 5 MG tablet Take 5 mg by mouth 2 (two) times daily.    . Cholecalciferol (VITAMIN D-3) 5000 units TABS Take 1 tablet by mouth 2 (two) times a week.    . COD LIVER OIL PO Take 1 tablet by mouth 3 (three) times a week.    . diclofenac (VOLTAREN) 75 MG EC tablet Take 75 mg by mouth as needed.    . gabapentin (NEURONTIN) 300 MG capsule   1  . hydrocortisone (ANUSOL-HC) 2.5 % rectal cream Place 1 application rectally 2 (two) times daily as needed for hemorrhoids or itching. 30 g 1  . JANUVIA 100 MG tablet   2  . lisinopril (PRINIVIL,ZESTRIL) 20 MG tablet Take 20 mg by mouth daily.    . metFORMIN (GLUCOPHAGE) 500 MG tablet Take 500 mg by mouth 3 (three) times daily. 1/2 tablet three times a day    . metoprolol succinate (TOPROL-XL) 50 MG 24 hr tablet Take 50 mg by mouth daily. Take with or immediately following a meal.    . metoprolol tartrate (LOPRESSOR) 50 MG tablet   0  . pravastatin (PRAVACHOL)  40 MG tablet Take 40 mg by mouth daily.    . traMADol (ULTRAM) 50 MG tablet Take by mouth every 6 (six) hours as needed.     No current facility-administered medications on file prior to visit.    No Known Allergies  No results found for this or any previous visit (from the past 2160 hour(s)).  Objective: General: Patient is awake, alert, and oriented x 3 and in no acute distress.  Integument: Skin is warm, dry and supple bilateral. Nails are tender, long, thickened and dystrophic with subungual debris, consistent with onychomycosis, 1-5 bilateral with most discoloration and thickening at 1st toes. No signs of infection. No open lesions or preulcerative lesions present bilateral. Remaining integument unremarkable.  Vasculature:  Dorsalis Pedis pulse 1/4 bilateral. Posterior Tibial pulse  1/4 bilateral. Capillary fill time <3 sec 1-5 bilateral. Positive hair growth to the level of the digits.Temperature gradient within normal limits. No varicosities present bilateral. No edema present bilateral. Subjective swelling to toes when burning pain is present.   Neurology: The patient has intact sensation measured with a 5.07/10g Semmes Weinstein Monofilament at all pedal sites bilateral . Vibratory sensation diminished bilateral with tuning fork. No Babinski sign present bilateral.   Musculoskeletal: Asymptomatic hammertoe pedal deformities noted bilateral. Muscular strength 5/5 in all lower extremity muscular groups  bilateral without pain on range of motion . No tenderness with calf compression bilateral.  Assessment and Plan: Problem List Items Addressed This Visit    None    Visit Diagnoses    Pain due to onychomycosis of toenails of both feet    -  Primary   Diabetic polyneuropathy associated with type 2 diabetes mellitus (HCC)       Relevant Medications   JANUVIA 100 MG tablet   gabapentin (NEURONTIN) 300 MG capsule   Diminished pulse       Toe swelling          -Examined  patient. -Discussed and educated patient on diabetic foot care, especially with  regards to the vascular, neurological and musculoskeletal systems.  -Stressed the importance of good glycemic control and the detriment of not  controlling glucose levels in relation to the foot. -Mechanically debrided all nails 1-5 bilateral using sterile nail nipper and filed with dremel without incident  -Recommend Fungi-nail consistently  -ABIs performed in office which are normal. Re-assured patient that burning and swelling to toes can be secondary to small fiber neuropathy that is affecting autonomic system; Advised to discussed with PCP about adjusting Gabapentin -Answered all patient questions -Patient to return  in 3 months for at risk foot care -Patient advised to call the office if any problems or questions arise in the meantime.  Landis Martins, DPM

## 2018-04-30 DIAGNOSIS — M5030 Other cervical disc degeneration, unspecified cervical region: Secondary | ICD-10-CM | POA: Diagnosis not present

## 2018-04-30 DIAGNOSIS — K210 Gastro-esophageal reflux disease with esophagitis: Secondary | ICD-10-CM | POA: Diagnosis not present

## 2018-04-30 DIAGNOSIS — I10 Essential (primary) hypertension: Secondary | ICD-10-CM | POA: Diagnosis not present

## 2018-04-30 DIAGNOSIS — E119 Type 2 diabetes mellitus without complications: Secondary | ICD-10-CM | POA: Diagnosis not present

## 2018-06-29 ENCOUNTER — Other Ambulatory Visit: Payer: Self-pay | Admitting: Cardiovascular Disease

## 2018-06-29 DIAGNOSIS — Z1231 Encounter for screening mammogram for malignant neoplasm of breast: Secondary | ICD-10-CM

## 2018-07-14 ENCOUNTER — Ambulatory Visit (INDEPENDENT_AMBULATORY_CARE_PROVIDER_SITE_OTHER): Payer: PPO | Admitting: Sports Medicine

## 2018-07-14 ENCOUNTER — Encounter: Payer: Self-pay | Admitting: Sports Medicine

## 2018-07-14 DIAGNOSIS — M79675 Pain in left toe(s): Secondary | ICD-10-CM | POA: Diagnosis not present

## 2018-07-14 DIAGNOSIS — B351 Tinea unguium: Secondary | ICD-10-CM | POA: Diagnosis not present

## 2018-07-14 DIAGNOSIS — M79674 Pain in right toe(s): Secondary | ICD-10-CM

## 2018-07-14 DIAGNOSIS — E1142 Type 2 diabetes mellitus with diabetic polyneuropathy: Secondary | ICD-10-CM | POA: Diagnosis not present

## 2018-07-14 DIAGNOSIS — I739 Peripheral vascular disease, unspecified: Secondary | ICD-10-CM

## 2018-07-14 NOTE — Patient Instructions (Signed)
Recommend OTC Aquaphor and Euercin cream

## 2018-07-14 NOTE — Progress Notes (Signed)
Subjective: Felicia Shelton is a 77 y.o. female patient with history of diabetes who presents to office today complaining of long,mildly painful nails  while ambulating in shoes; unable to trim. FBS today 127.  Patient admits dryness to skin but otherwise denies any other symptoms at this time.  Patient Active Problem List   Diagnosis Date Noted  . Hypertension 08/04/2012  . Hx of adenomatous colonic polyp 05/26/2008   Current Outpatient Medications on File Prior to Visit  Medication Sig Dispense Refill  . amLODipine (NORVASC) 5 MG tablet Take 5 mg by mouth 2 (two) times daily.    . Cholecalciferol (VITAMIN D-3) 5000 units TABS Take 1 tablet by mouth 2 (two) times a week.    . COD LIVER OIL PO Take 1 tablet by mouth 3 (three) times a week.    . diclofenac (VOLTAREN) 75 MG EC tablet Take 75 mg by mouth as needed.    . gabapentin (NEURONTIN) 300 MG capsule   1  . hydrocortisone (ANUSOL-HC) 2.5 % rectal cream Place 1 application rectally 2 (two) times daily as needed for hemorrhoids or itching. 30 g 1  . JANUVIA 100 MG tablet   2  . lisinopril (PRINIVIL,ZESTRIL) 20 MG tablet Take 20 mg by mouth daily.    . metFORMIN (GLUCOPHAGE) 500 MG tablet Take 500 mg by mouth 3 (three) times daily. 1/2 tablet three times a day    . metoprolol succinate (TOPROL-XL) 50 MG 24 hr tablet Take 50 mg by mouth daily. Take with or immediately following a meal.    . metoprolol tartrate (LOPRESSOR) 50 MG tablet   0  . pravastatin (PRAVACHOL) 40 MG tablet Take 40 mg by mouth daily.    . traMADol (ULTRAM) 50 MG tablet Take by mouth every 6 (six) hours as needed.     No current facility-administered medications on file prior to visit.    No Known Allergies  No results found for this or any previous visit (from the past 2160 hour(s)).  Objective: General: Patient is awake, alert, and oriented x 3 and in no acute distress.  Integument: Skin is warm, dry and supple bilateral. Nails are tender, long, thickened and  dystrophic with subungual debris, consistent with onychomycosis, 1-5 bilateral with most discoloration and thickening at 1st toes. No signs of infection. No open lesions or preulcerative lesions present bilateral. Remaining integument unremarkable.  Vasculature:  Dorsalis Pedis pulse 1/4 bilateral. Posterior Tibial pulse  1/4 bilateral. Capillary fill time <3 sec 1-5 bilateral. Positive hair growth to the level of the digits.Temperature gradient within normal limits. No varicosities present bilateral. No edema present bilateral. Subjective swelling to toes when burning pain is present as previous.   Neurology: The patient has intact sensation measured with a 5.07/10g Semmes Weinstein Monofilament at all pedal sites bilateral . Vibratory sensation diminished bilateral with tuning fork. No Babinski sign present bilateral.   Musculoskeletal: Asymptomatic hammertoe pedal deformities noted bilateral. Muscular strength 5/5 in all lower extremity muscular groups bilateral without pain on range of motion . No tenderness with calf compression bilateral.  Assessment and Plan: Problem List Items Addressed This Visit    None    Visit Diagnoses    Pain due to onychomycosis of toenails of both feet    -  Primary   Diabetic polyneuropathy associated with type 2 diabetes mellitus (HCC)       PAD (peripheral artery disease) ([place])          -Examined patient. -Discussed and educated patient  on diabetic foot care, especially with  regards to the vascular, neurological and musculoskeletal systems.  -Stressed the importance of good glycemic control and the detriment of not  controlling glucose levels in relation to the foot. -Mechanically debrided all nails 1-5 bilateral using sterile nail nipper and filed with dremel without incident  -Recommend continue with Fungi-nail consistently  -Continue with PCP recommendations for gabapentin -Advised patient to continue with good skin creams and moisturizers  recommended over-the-counter Aquaphor or Eucerin cream for patient to consider getting RevitaDerm cream from our office -Answered all patient questions -Patient to return  in 3 months for at risk foot care -Patient advised to call the office if any problems or questions arise in the meantime.  Felicia Shelton, DPM

## 2018-08-04 ENCOUNTER — Ambulatory Visit
Admission: RE | Admit: 2018-08-04 | Discharge: 2018-08-04 | Disposition: A | Discharge status: Home / Self Care | Payer: PPO | Source: Ambulatory Visit | Attending: Cardiovascular Disease | Admitting: Cardiovascular Disease

## 2018-08-04 DIAGNOSIS — Z1231 Encounter for screening mammogram for malignant neoplasm of breast: Secondary | ICD-10-CM

## 2018-08-13 DIAGNOSIS — M5030 Other cervical disc degeneration, unspecified cervical region: Secondary | ICD-10-CM | POA: Diagnosis not present

## 2018-08-13 DIAGNOSIS — E663 Overweight: Secondary | ICD-10-CM | POA: Diagnosis not present

## 2018-08-13 DIAGNOSIS — E1140 Type 2 diabetes mellitus with diabetic neuropathy, unspecified: Secondary | ICD-10-CM | POA: Diagnosis not present

## 2018-08-13 DIAGNOSIS — I10 Essential (primary) hypertension: Secondary | ICD-10-CM | POA: Diagnosis not present

## 2018-08-18 DIAGNOSIS — E119 Type 2 diabetes mellitus without complications: Secondary | ICD-10-CM | POA: Diagnosis not present

## 2018-08-25 DIAGNOSIS — M48061 Spinal stenosis, lumbar region without neurogenic claudication: Secondary | ICD-10-CM | POA: Diagnosis not present

## 2018-08-25 DIAGNOSIS — M545 Low back pain: Secondary | ICD-10-CM | POA: Diagnosis not present

## 2018-08-28 ENCOUNTER — Other Ambulatory Visit: Payer: Self-pay | Admitting: Physical Medicine and Rehabilitation

## 2018-08-28 DIAGNOSIS — M545 Low back pain, unspecified: Secondary | ICD-10-CM

## 2018-08-28 DIAGNOSIS — G8929 Other chronic pain: Secondary | ICD-10-CM

## 2018-09-09 ENCOUNTER — Ambulatory Visit
Admission: RE | Admit: 2018-09-09 | Discharge: 2018-09-09 | Disposition: A | Discharge status: Home / Self Care | Payer: PPO | Source: Ambulatory Visit | Attending: Physical Medicine and Rehabilitation | Admitting: Physical Medicine and Rehabilitation

## 2018-09-09 DIAGNOSIS — G8929 Other chronic pain: Secondary | ICD-10-CM

## 2018-09-09 DIAGNOSIS — M48061 Spinal stenosis, lumbar region without neurogenic claudication: Secondary | ICD-10-CM | POA: Diagnosis not present

## 2018-09-09 DIAGNOSIS — M545 Low back pain: Principal | ICD-10-CM

## 2018-09-10 DIAGNOSIS — L819 Disorder of pigmentation, unspecified: Secondary | ICD-10-CM | POA: Diagnosis not present

## 2018-09-15 DIAGNOSIS — M48061 Spinal stenosis, lumbar region without neurogenic claudication: Secondary | ICD-10-CM | POA: Diagnosis not present

## 2018-09-15 DIAGNOSIS — M545 Low back pain: Secondary | ICD-10-CM | POA: Diagnosis not present

## 2018-10-01 DIAGNOSIS — M545 Low back pain: Secondary | ICD-10-CM | POA: Diagnosis not present

## 2018-10-01 DIAGNOSIS — M48061 Spinal stenosis, lumbar region without neurogenic claudication: Secondary | ICD-10-CM | POA: Diagnosis not present

## 2018-10-20 ENCOUNTER — Encounter: Payer: Self-pay | Admitting: Sports Medicine

## 2018-10-20 ENCOUNTER — Ambulatory Visit: Payer: PPO | Admitting: Sports Medicine

## 2018-10-20 DIAGNOSIS — B351 Tinea unguium: Secondary | ICD-10-CM | POA: Diagnosis not present

## 2018-10-20 DIAGNOSIS — I739 Peripheral vascular disease, unspecified: Secondary | ICD-10-CM

## 2018-10-20 DIAGNOSIS — M79674 Pain in right toe(s): Secondary | ICD-10-CM

## 2018-10-20 DIAGNOSIS — M79675 Pain in left toe(s): Secondary | ICD-10-CM

## 2018-10-20 DIAGNOSIS — E1142 Type 2 diabetes mellitus with diabetic polyneuropathy: Secondary | ICD-10-CM

## 2018-10-20 NOTE — Progress Notes (Signed)
Subjective: Felicia Shelton is a 77 y.o. female patient with history of diabetes who presents to office today complaining of long,mildly painful nails  while ambulating in shoes; unable to trim. FBS today 117.  Patient denies any new pedal complaints since last visit.  Patient Active Problem List   Diagnosis Date Noted  . Hypertension 08/04/2012  . Hx of adenomatous colonic polyp 05/26/2008   Current Outpatient Medications on File Prior to Visit  Medication Sig Dispense Refill  . amLODipine (NORVASC) 5 MG tablet Take 5 mg by mouth 2 (two) times daily.    . Cholecalciferol (VITAMIN D-3) 5000 units TABS Take 1 tablet by mouth 2 (two) times a week.    . COD LIVER OIL PO Take 1 tablet by mouth 3 (three) times a week.    . diclofenac (VOLTAREN) 75 MG EC tablet Take 75 mg by mouth as needed.    . gabapentin (NEURONTIN) 300 MG capsule   1  . hydrocortisone (ANUSOL-HC) 2.5 % rectal cream Place 1 application rectally 2 (two) times daily as needed for hemorrhoids or itching. 30 g 1  . JANUVIA 100 MG tablet   2  . lisinopril (PRINIVIL,ZESTRIL) 20 MG tablet Take 20 mg by mouth daily.    . metFORMIN (GLUCOPHAGE) 500 MG tablet Take 500 mg by mouth 3 (three) times daily. 1/2 tablet three times a day    . metoprolol succinate (TOPROL-XL) 50 MG 24 hr tablet Take 50 mg by mouth daily. Take with or immediately following a meal.    . metoprolol tartrate (LOPRESSOR) 50 MG tablet   0  . pantoprazole (PROTONIX) 40 MG tablet Take 40 mg by mouth daily.  1  . pravastatin (PRAVACHOL) 40 MG tablet Take 40 mg by mouth daily.    . predniSONE (STERAPRED UNI-PAK 21 TAB) 5 MG (21) TBPK tablet TAKE 1 PACK AS DIRECTED.  0  . traMADol (ULTRAM) 50 MG tablet Take by mouth every 6 (six) hours as needed.     No current facility-administered medications on file prior to visit.    No Known Allergies  No results found for this or any previous visit (from the past 2160 hour(s)).  Objective: General: Patient is awake, alert, and  oriented x 3 and in no acute distress.  Integument: Skin is warm, dry and supple bilateral. Nails are tender, long, thickened and dystrophic with subungual debris, consistent with onychomycosis, 1-5 bilateral with most discoloration and thickening at 1st toes. No signs of infection. No open lesions or preulcerative lesions present bilateral. Remaining integument unremarkable.  Vasculature:  Dorsalis Pedis pulse 1/4 bilateral. Posterior Tibial pulse  1/4 bilateral. Capillary fill time <3 sec 1-5 bilateral. Positive hair growth to the level of the digits.Temperature gradient within normal limits. No varicosities present bilateral. No edema present bilateral.   Neurology: The patient has intact sensation measured with a 5.07/10g Semmes Weinstein Monofilament at all pedal sites bilateral. Vibratory sensation diminished bilateral with tuning fork. No Babinski sign present bilateral.   Musculoskeletal: Asymptomatic hammertoe pedal deformities noted bilateral. Muscular strength 5/5 in all lower extremity muscular groups bilateral without pain on range of motion . No tenderness with calf compression bilateral.  Assessment and Plan: Problem List Items Addressed This Visit    None    Visit Diagnoses    Pain due to onychomycosis of toenails of both feet    -  Primary   Diabetic polyneuropathy associated with type 2 diabetes mellitus (HCC)       PAD (peripheral artery  disease) [facility])          -Examined patient. -Discussed and educated patient on diabetic foot care, especially with  regards to the vascular, neurological and musculoskeletal systems. . -Mechanically debrided all nails 1-5 bilateral using sterile nail nipper and filed with dremel without incident  -Patient to return  in 3 months for at risk foot care -Patient advised to call the office if any problems or questions arise in the meantime.  Landis Martins, DPM

## 2018-10-26 DIAGNOSIS — M48061 Spinal stenosis, lumbar region without neurogenic claudication: Secondary | ICD-10-CM | POA: Diagnosis not present

## 2018-10-26 DIAGNOSIS — M545 Low back pain: Secondary | ICD-10-CM | POA: Diagnosis not present

## 2018-11-03 DIAGNOSIS — M545 Low back pain: Secondary | ICD-10-CM | POA: Diagnosis not present

## 2018-11-05 DIAGNOSIS — Z79899 Other long term (current) drug therapy: Secondary | ICD-10-CM | POA: Diagnosis not present

## 2018-11-05 DIAGNOSIS — E785 Hyperlipidemia, unspecified: Secondary | ICD-10-CM | POA: Diagnosis not present

## 2018-11-05 DIAGNOSIS — E876 Hypokalemia: Secondary | ICD-10-CM | POA: Diagnosis not present

## 2018-11-05 DIAGNOSIS — E559 Vitamin D deficiency, unspecified: Secondary | ICD-10-CM | POA: Diagnosis not present

## 2018-11-05 DIAGNOSIS — D649 Anemia, unspecified: Secondary | ICD-10-CM | POA: Diagnosis not present

## 2018-11-19 DIAGNOSIS — I10 Essential (primary) hypertension: Secondary | ICD-10-CM | POA: Diagnosis not present

## 2018-11-19 DIAGNOSIS — I2510 Atherosclerotic heart disease of native coronary artery without angina pectoris: Secondary | ICD-10-CM | POA: Diagnosis not present

## 2018-11-19 DIAGNOSIS — E119 Type 2 diabetes mellitus without complications: Secondary | ICD-10-CM | POA: Diagnosis not present

## 2018-11-19 DIAGNOSIS — M5186 Other intervertebral disc disorders, lumbar region: Secondary | ICD-10-CM | POA: Diagnosis not present

## 2018-11-24 DIAGNOSIS — M48062 Spinal stenosis, lumbar region with neurogenic claudication: Secondary | ICD-10-CM | POA: Diagnosis not present

## 2018-11-25 DIAGNOSIS — I425 Other restrictive cardiomyopathy: Secondary | ICD-10-CM | POA: Diagnosis not present

## 2018-11-25 DIAGNOSIS — I361 Nonrheumatic tricuspid (valve) insufficiency: Secondary | ICD-10-CM | POA: Diagnosis not present

## 2018-11-25 DIAGNOSIS — I2510 Atherosclerotic heart disease of native coronary artery without angina pectoris: Secondary | ICD-10-CM | POA: Diagnosis not present

## 2018-11-25 DIAGNOSIS — I340 Nonrheumatic mitral (valve) insufficiency: Secondary | ICD-10-CM | POA: Diagnosis not present

## 2018-12-14 DIAGNOSIS — M5186 Other intervertebral disc disorders, lumbar region: Secondary | ICD-10-CM | POA: Diagnosis not present

## 2018-12-14 DIAGNOSIS — E119 Type 2 diabetes mellitus without complications: Secondary | ICD-10-CM | POA: Diagnosis not present

## 2018-12-14 DIAGNOSIS — I10 Essential (primary) hypertension: Secondary | ICD-10-CM | POA: Diagnosis not present

## 2018-12-14 DIAGNOSIS — J0100 Acute maxillary sinusitis, unspecified: Secondary | ICD-10-CM | POA: Diagnosis not present

## 2018-12-24 ENCOUNTER — Other Ambulatory Visit: Payer: Self-pay | Admitting: Orthopedic Surgery

## 2018-12-25 DIAGNOSIS — M48062 Spinal stenosis, lumbar region with neurogenic claudication: Secondary | ICD-10-CM | POA: Diagnosis not present

## 2018-12-28 NOTE — Pre-Procedure Instructions (Signed)
Felicia Shelton  12/28/2018      [place], [place] [address] Phone: [phone] Fax: [phone]  [place], [place] - [address] [place] [address] Phone: [phone] Fax: [phone]    Your procedure is scheduled on Feb. 13  Report to [facility] Admitting at 5:30 A.M.  Call this number if you have problems the morning of surgery:  [phone]   Remember:  Do not eat or drink after midnight.      Take these medicines the morning of surgery with A SIP OF WATER :              Tylenol if needed             Amlodipine (norvasc)             7 days prior to surgery STOP taking any Aspirin (unless otherwise instructed by your surgeon), Aleve, Naproxen, Ibuprofen, Motrin, Advil, Goody's, BC's, all herbal medications, fish oil, and all vitamins.                     How to Manage Your Diabetes Before and After Surgery  Why is it important to control my blood sugar before and after surgery? . Improving blood sugar levels before and after surgery helps healing and can limit problems. . A way of improving blood sugar control is eating a healthy diet by: o  Eating less sugar and carbohydrates o  Increasing activity/exercise o  Talking with your doctor about reaching your blood sugar goals . High blood sugars (greater than 180 mg/dL) can raise your risk of infections and slow your recovery, so you will need to focus on controlling your diabetes during the weeks before surgery. . Make sure that the doctor who takes care of your diabetes knows about your planned surgery including the date and location.  How do I manage my blood sugar before surgery? . Check your blood sugar at least 4 times a day, starting 2 days before surgery, to make sure that the level is not too high or low. o Check your blood sugar the morning of your surgery when you wake  up and every 2 hours until you get to the Short Stay unit. . If your blood sugar is less than 70 mg/dL, you will need to treat for low blood sugar: o Do not take insulin. o Treat a low blood sugar (less than 70 mg/dL) with  cup of clear juice (cranberry or apple), 4 glucose tablets, OR glucose gel. Recheck blood sugar in 15 minutes after treatment (to make sure it is greater than 70 mg/dL). If your blood sugar is not greater than 70 mg/dL on recheck, call [phone] o  for further instructions. . Report your blood sugar to the short stay nurse when you get to Short Stay.  . If you are admitted to the hospital after surgery: o Your blood sugar will be checked by the staff and you will probably be given insulin after surgery (instead of oral diabetes medicines) to make sure you have good blood sugar levels. o The goal for blood sugar control after surgery is 80-180 mg/dL.        WHAT DO I DO ABOUT MY DIABETES MEDICATION?   Marland Kitchen Do not take oral diabetes medicines (pills) the morning of surgery.     (JANUVIA)  .  THE NIGHT BEFORE SURGERY, take ___________ units of ___________insulin.       . THE MORNING OF SURGERY, take _____________ units of __________insulin.  . The day of surgery, do not take other diabetes injectables, including Byetta (exenatide), Bydureon (exenatide ER), Victoza (liraglutide), or Trulicity (dulaglutide).  . If your CBG is greater than 220 mg/dL, you may take  of your sliding scale (correction) dose of insulin.  Other Instructions:          Patient Signature:  Date:   Nurse Signature:  Date:   Reviewed and Endorsed by [facility] Patient Education Committee, August 2015                 Do not wear jewelry, make-up or nail polish.  Do not wear lotions, powders, or perfumes, or deodorant.  Do not shave 48 hours prior to surgery.  Men may shave face and neck.  Do not bring valuables to the hospital.  [facility] is not responsible for any belongings or  valuables.  Contacts, dentures or bridgework may not be worn into surgery.  Leave your suitcase in the car.  After surgery it may be brought to your room.  For patients admitted to the hospital, discharge time will be determined by your treatment team.  Patients discharged the day of surgery will not be allowed to drive home.    Special instructions:  [facility]- Preparing For Surgery  Before surgery, you can play an important role. Because skin is not sterile, your skin needs to be as free of germs as possible. You can reduce the number of germs on your skin by washing with CHG (chlorahexidine gluconate) Soap before surgery.  CHG is an antiseptic cleaner which kills germs and bonds with the skin to continue killing germs even after washing.    Oral Hygiene is also important to reduce your risk of infection.  Remember - BRUSH YOUR TEETH THE MORNING OF SURGERY WITH YOUR REGULAR TOOTHPASTE  Please do not use if you have an allergy to CHG or antibacterial soaps. If your skin becomes reddened/irritated stop using the CHG.  Do not shave (including legs and underarms) for at least 48 hours prior to first CHG shower. It is OK to shave your face.  Please follow these instructions carefully.   1. Shower the NIGHT BEFORE SURGERY and the MORNING OF SURGERY with CHG.   2. If you chose to wash your hair, wash your hair first as usual with your normal shampoo.  3. After you shampoo, rinse your hair and body thoroughly to remove the shampoo.  4. Use CHG as you would any other liquid soap. You can apply CHG directly to the skin and wash gently with a scrungie or a clean washcloth.   5. Apply the CHG Soap to your body ONLY FROM THE NECK DOWN.  Do not use on open wounds or open sores. Avoid contact with your eyes, ears, mouth and genitals (private parts). Wash Face and genitals (private parts)  with your normal soap.  6. Wash thoroughly, paying special attention to the area where your surgery will be  performed.  7. Thoroughly rinse your body with warm water from the neck down.  8. DO NOT shower/wash with your normal soap after using and rinsing off the CHG Soap.  9. Pat yourself dry with a CLEAN TOWEL.  10. Wear CLEAN PAJAMAS to bed the night before surgery, wear comfortable clothes the morning of surgery  11. Place CLEAN SHEETS on your bed  the night of your first shower and DO NOT SLEEP WITH PETS.    Day of Surgery:  Do not apply any deodorants/lotions.  Please wear clean clothes to the hospital/surgery center.   Remember to brush your teeth WITH YOUR REGULAR TOOTHPASTE.    Please read over the following fact sheets that you were given. Coughing and Deep Breathing and Surgical Site Infection Prevention

## 2018-12-29 ENCOUNTER — Other Ambulatory Visit: Payer: Self-pay

## 2018-12-29 ENCOUNTER — Encounter (HOSPITAL_COMMUNITY)
Admission: RE | Admit: 2018-12-29 | Discharge: 2018-12-29 | Disposition: A | Discharge status: Home / Self Care | Payer: PPO | Source: Ambulatory Visit | Attending: Orthopedic Surgery | Admitting: Orthopedic Surgery

## 2018-12-29 ENCOUNTER — Encounter (HOSPITAL_COMMUNITY): Payer: Self-pay

## 2018-12-29 DIAGNOSIS — E119 Type 2 diabetes mellitus without complications: Secondary | ICD-10-CM | POA: Diagnosis not present

## 2018-12-29 DIAGNOSIS — R52 Pain, unspecified: Secondary | ICD-10-CM | POA: Diagnosis not present

## 2018-12-29 DIAGNOSIS — G709 Myoneural disorder, unspecified: Secondary | ICD-10-CM | POA: Diagnosis not present

## 2018-12-29 DIAGNOSIS — M5489 Other dorsalgia: Secondary | ICD-10-CM | POA: Diagnosis not present

## 2018-12-29 DIAGNOSIS — M79604 Pain in right leg: Secondary | ICD-10-CM | POA: Diagnosis present

## 2018-12-29 DIAGNOSIS — I10 Essential (primary) hypertension: Secondary | ICD-10-CM | POA: Diagnosis not present

## 2018-12-29 DIAGNOSIS — M549 Dorsalgia, unspecified: Secondary | ICD-10-CM | POA: Diagnosis not present

## 2018-12-29 DIAGNOSIS — M48061 Spinal stenosis, lumbar region without neurogenic claudication: Secondary | ICD-10-CM | POA: Diagnosis not present

## 2018-12-29 DIAGNOSIS — Z01812 Encounter for preprocedural laboratory examination: Secondary | ICD-10-CM

## 2018-12-29 DIAGNOSIS — M4316 Spondylolisthesis, lumbar region: Secondary | ICD-10-CM | POA: Diagnosis not present

## 2018-12-29 DIAGNOSIS — Z743 Need for continuous supervision: Secondary | ICD-10-CM | POA: Diagnosis not present

## 2018-12-29 DIAGNOSIS — E559 Vitamin D deficiency, unspecified: Secondary | ICD-10-CM | POA: Diagnosis not present

## 2018-12-29 DIAGNOSIS — R279 Unspecified lack of coordination: Secondary | ICD-10-CM | POA: Diagnosis not present

## 2018-12-29 DIAGNOSIS — M6281 Muscle weakness (generalized): Secondary | ICD-10-CM | POA: Diagnosis not present

## 2018-12-29 DIAGNOSIS — Z86010 Personal history of colonic polyps: Secondary | ICD-10-CM | POA: Diagnosis not present

## 2018-12-29 DIAGNOSIS — Z79899 Other long term (current) drug therapy: Secondary | ICD-10-CM | POA: Diagnosis not present

## 2018-12-29 DIAGNOSIS — M5416 Radiculopathy, lumbar region: Secondary | ICD-10-CM | POA: Diagnosis not present

## 2018-12-29 DIAGNOSIS — M4326 Fusion of spine, lumbar region: Secondary | ICD-10-CM | POA: Diagnosis not present

## 2018-12-29 DIAGNOSIS — R2689 Other abnormalities of gait and mobility: Secondary | ICD-10-CM | POA: Diagnosis not present

## 2018-12-29 DIAGNOSIS — R488 Other symbolic dysfunctions: Secondary | ICD-10-CM | POA: Diagnosis not present

## 2018-12-29 DIAGNOSIS — K635 Polyp of colon: Secondary | ICD-10-CM | POA: Diagnosis not present

## 2018-12-29 DIAGNOSIS — Z87891 Personal history of nicotine dependence: Secondary | ICD-10-CM | POA: Diagnosis not present

## 2018-12-29 DIAGNOSIS — E785 Hyperlipidemia, unspecified: Secondary | ICD-10-CM | POA: Diagnosis not present

## 2018-12-29 DIAGNOSIS — M48062 Spinal stenosis, lumbar region with neurogenic claudication: Secondary | ICD-10-CM | POA: Diagnosis not present

## 2018-12-29 HISTORY — DX: Myoneural disorder, unspecified: G70.9

## 2018-12-29 LAB — CBC WITH DIFFERENTIAL/PLATELET
Abs Immature Granulocytes: 0.02 10*3/uL (ref 0.00–0.07)
Basophils Absolute: 0.1 10*3/uL (ref 0.0–0.1)
Basophils Relative: 1 %
Eosinophils Absolute: 0.2 10*3/uL (ref 0.0–0.5)
Eosinophils Relative: 4 %
HCT: 42.8 % (ref 36.0–46.0)
HEMOGLOBIN: 13.5 g/dL (ref 12.0–15.0)
Immature Granulocytes: 0 %
Lymphocytes Relative: 36 %
Lymphs Abs: 2.2 10*3/uL (ref 0.7–4.0)
MCH: 26.9 pg (ref 26.0–34.0)
MCHC: 31.5 g/dL (ref 30.0–36.0)
MCV: 85.3 fL (ref 80.0–100.0)
Monocytes Absolute: 0.6 10*3/uL (ref 0.1–1.0)
Monocytes Relative: 10 %
NRBC: 0 % (ref 0.0–0.2)
Neutro Abs: 3 10*3/uL (ref 1.7–7.7)
Neutrophils Relative %: 49 %
Platelets: 246 10*3/uL (ref 150–400)
RBC: 5.02 MIL/uL (ref 3.87–5.11)
RDW: 14.9 % (ref 11.5–15.5)
WBC: 6.1 10*3/uL (ref 4.0–10.5)

## 2018-12-29 LAB — COMPREHENSIVE METABOLIC PANEL
ALK PHOS: 69 U/L (ref 38–126)
ALT: 20 U/L (ref 0–44)
AST: 22 U/L (ref 15–41)
Albumin: 3.8 g/dL (ref 3.5–5.0)
Anion gap: 11 (ref 5–15)
BUN: 19 mg/dL (ref 8–23)
CO2: 20 mmol/L — ABNORMAL LOW (ref 22–32)
Calcium: 9.6 mg/dL (ref 8.9–10.3)
Chloride: 109 mmol/L (ref 98–111)
Creatinine, Ser: 1.35 mg/dL — ABNORMAL HIGH (ref 0.44–1.00)
GFR calc Af Amer: 44 mL/min — ABNORMAL LOW (ref >60)
GFR calc non Af Amer: 38 mL/min — ABNORMAL LOW (ref >60)
Glucose, Bld: 146 mg/dL — ABNORMAL HIGH (ref 70–99)
Potassium: 3.9 mmol/L (ref 3.5–5.1)
SODIUM: 140 mmol/L (ref 135–145)
Total Bilirubin: 0.8 mg/dL (ref 0.3–1.2)
Total Protein: 6.9 g/dL (ref 6.5–8.1)

## 2018-12-29 LAB — TYPE AND SCREEN
ABO/RH(D): O POS
Antibody Screen: POSITIVE
DAT, IGG: POSITIVE

## 2018-12-29 LAB — URINALYSIS, ROUTINE W REFLEX MICROSCOPIC
Bilirubin Urine: NEGATIVE
Glucose, UA: NEGATIVE mg/dL
Hgb urine dipstick: NEGATIVE
Ketones, ur: NEGATIVE mg/dL
Leukocytes, UA: NEGATIVE
Nitrite: NEGATIVE
PH: 5.5 (ref 5.0–8.0)
Protein, ur: NEGATIVE mg/dL
Specific Gravity, Urine: 1.02 (ref 1.005–1.030)

## 2018-12-29 LAB — PROTIME-INR
INR: 1.1
Prothrombin Time: 14.1 seconds (ref 11.4–15.2)

## 2018-12-29 LAB — APTT: aPTT: 26 seconds (ref 24–36)

## 2018-12-29 LAB — SURGICAL PCR SCREEN
MRSA, PCR: NEGATIVE
Staphylococcus aureus: NEGATIVE

## 2018-12-29 LAB — HEMOGLOBIN A1C
Hgb A1c MFr Bld: 6.6 % — ABNORMAL HIGH (ref 4.8–5.6)
Mean Plasma Glucose: 142.72 mg/dL

## 2018-12-29 LAB — GLUCOSE, CAPILLARY: Glucose-Capillary: 127 mg/dL — ABNORMAL HIGH (ref 70–99)

## 2018-12-29 NOTE — Progress Notes (Signed)
Received call from blood bank that antibody screen was positive. Not enough blood in sample to run for cross match. Per blood bank, patient needs to come back for another blood draw prior to surgery date. Called patient, aware she needs to come back for lab work. Patient states she will come back this afternoon for redraw of type and screen.

## 2018-12-29 NOTE — Progress Notes (Signed)
PCP - Dr. Dixie Dials Cardiologist - Dr. Doylene Canard GI: Dr. Carlean Purl  Chest x-ray - N/A EKG - "2-3 weeks ago"; records requested Stress Test - "10 years ago"- records requested ECHO -"2-3 weeks ago"; records requested  Cardiac Cath - "20 or more years ago"- records requested from cardiology  Sleep Study - 2016 CPAP - not diagnosed with OSA  Fasting Blood Sugar - 120's Checks Blood Sugar 2-3 times a week   Aspirin Instructions:Patient instructed to hold all Aspirin, NSAID's, herbal medications, fish oil and vitamins 7 days prior to surgery.   Anesthesia review: cardiology records requested  Patient denies shortness of breath, fever, cough and chest pain at PAT appointment   Patient verbalized understanding of instructions that were given to them at the PAT appointment. Patient was also instructed that they will need to review over the PAT instructions again at home before surgery.

## 2018-12-30 NOTE — Progress Notes (Signed)
Anesthesia Chart Review:  Case:  [id number] Date/Time:  12/31/18 0715   Procedure:  RIGHT - SIDED LUMBAR 2-3,LUMBAR 3-4, LUMBAR 4-5 TRANSFORAMINAL LUMBAR INTERBODY FUSION WITH INSTRUMENTATION AND ALLOGRAFT (Right )   Anesthesia type:  General   Pre-op diagnosis:  SPINAL STENOSIS SPANNING LUMBAR 2 - LUMBAR 5   Location:  [facility] OR ROOM 05 / [place] OR   Surgeon:  Phylliss Bob, MD      DISCUSSION: 78 yo female former smoker. Pertinent hx includes DMII, HTN, HLD, CKD II.  Pt follows with Dr. Doylene Canard for management of CVD risk factors and essentially for primary care as well. She had an echo 11/25/2018 showing EF 55-60%, normal LV systolic function, grade 1 dd. Following echo, she was cleared for surgery per OV note 11/25/2018.  Anticipate she can proceed as planned barring acute status change.   VS: BP (!) 154/69   Pulse 86   Temp 37.3 C   Resp 18   Ht 5\' 6"  (1.676 m)   Wt 75.8 kg   SpO2 99%   BMI 26.99 kg/m   PROVIDERS: Dixie Dials, MD is Cardiologist/PCP   LABS: Labs reviewed: Acceptable for surgery. (all labs ordered are listed, but only abnormal results are displayed)  Labs Reviewed  GLUCOSE, CAPILLARY - Abnormal; Notable for the following components:      Result Value   Glucose-Capillary 127 (*)    All other components within normal limits  COMPREHENSIVE METABOLIC PANEL - Abnormal; Notable for the following components:   CO2 20 (*)    Glucose, Bld 146 (*)    Creatinine, Ser 1.35 (*)    GFR calc non Af Amer 38 (*)    GFR calc Af Amer 44 (*)    All other components within normal limits  URINALYSIS, ROUTINE W REFLEX MICROSCOPIC - Abnormal; Notable for the following components:   APPearance HAZY (*)    All other components within normal limits  HEMOGLOBIN A1C - Abnormal; Notable for the following components:   Hgb A1c MFr Bld 6.6 (*)    All other components within normal limits  SURGICAL PCR SCREEN  APTT  CBC WITH DIFFERENTIAL/PLATELET  PROTIME-INR  TYPE AND SCREEN  TYPE AND  SCREEN    EKG: 11/19/2018 (copy on pt chart): NSR. Rate 76. Nonspecific interventricular conduction delay. Early transition.   CV: TTE 11/25/2018 (copy on pt chart): Normal LV systolic function.  EF 55 to 60%.  Grade 1 diastolic dysfunction.  Minimal MR and TR.  Past Medical History:  Diagnosis Date  . Allergy   . Arthritis    back  . Diabetes mellitus without complication ([place])   . Hx of adenomatous colonic polyp 05/26/2008  . Hyperlipidemia   . Hypertension   . Neuromuscular disorder [facility])     Past Surgical History:  Procedure Laterality Date  . ABDOMINAL HYSTERECTOMY    . APPENDECTOMY    . BREAST EXCISIONAL BIOPSY Left   . BREAST SURGERY     left breast  . TONSILLECTOMY     removed age [age]    MEDICATIONS: . acetaminophen (TYLENOL) 650 MG CR tablet  . amLODipine (NORVASC) 5 MG tablet  . atorvastatin (LIPITOR) 40 MG tablet  . Cholecalciferol (VITAMIN D-3) 5000 units TABS  . diclofenac (VOLTAREN) 75 MG EC tablet  . fexofenadine-pseudoephedrine (ALLEGRA-D 24) 180-240 MG 24 hr tablet  . gabapentin (NEURONTIN) 300 MG capsule  . hydrocortisone (ANUSOL-HC) 2.5 % rectal cream  . JANUVIA 100 MG tablet  . Lidocaine (ASPERCREME LIDOCAINE) 4 %  PTCH  . lisinopril (PRINIVIL,ZESTRIL) 20 MG tablet  . metoprolol tartrate (LOPRESSOR) 50 MG tablet  . Nerve Stimulator (TENS WIRED PAIN MANAGEMENT) DEVI  . pantoprazole (PROTONIX) 40 MG tablet   No current facility-administered medications for this encounter.     Wynonia Musty [facility] [place] Short Stay Center/Anesthesiology Phone [phone] 12/30/2018 9:31 AM

## 2018-12-30 NOTE — Anesthesia Preprocedure Evaluation (Addendum)
Anesthesia Evaluation  Patient identified by MRN, date of birth, ID band Patient awake    Reviewed: Allergy & Precautions, NPO status , Patient's Chart, lab work & pertinent test results, reviewed documented beta blocker date and time   Airway Mallampati: II  TM Distance: >3 FB Neck ROM: Full    Dental no notable dental hx.    Pulmonary former smoker,    Pulmonary exam normal breath sounds clear to auscultation       Cardiovascular hypertension, Pt. on medications and Pt. on home beta blockers Normal cardiovascular exam Rhythm:Regular Rate:Normal  She had an echo 11/25/2018 showing EF 55-60%, normal LV systolic function, grade 1 dd. Following echo, she was cleared for surgery per OV note 11/25/2018.  Dixie Dials, MD is Cardiologist/PCP  EKG: 11/19/2018 (copy on pt chart): NSR. Rate 76. Nonspecific interventricular conduction delay. Early transition.    Neuro/Psych  Neuromuscular disease negative psych ROS   GI/Hepatic Neg liver ROS, GERD  Medicated and Controlled,  Endo/Other  diabetes  Renal/GU negative Renal ROS     Musculoskeletal negative musculoskeletal ROS (+)   Abdominal   Peds  Hematology HLD   Anesthesia Other Findings SPINAL STENOSIS SPANNING LUMBAR 2 - LUMBAR 5  Reproductive/Obstetrics                           Anesthesia Physical Anesthesia Plan  ASA: III  Anesthesia Plan: General   Post-op Pain Management:    Induction: Intravenous  PONV Risk Score and Plan: 4 or greater and Midazolam, Dexamethasone, Ondansetron and Treatment may vary due to age or medical condition  Airway Management Planned: Oral ETT  Additional Equipment: Arterial line  Intra-op Plan:   Post-operative Plan: Extubation in OR  Informed Consent: I have reviewed the patients History and Physical, chart, labs and discussed the procedure including the risks, benefits and alternatives for the proposed  anesthesia with the patient or authorized representative who has indicated his/her understanding and acceptance.     Dental advisory given  Plan Discussed with: CRNA  Anesthesia Plan Comments: (Reviewed PAT note by Karoline Caldwell, PA-C  M and M: acetaminophen and ketamine)      Anesthesia Quick Evaluation

## 2018-12-31 ENCOUNTER — Inpatient Hospital Stay (HOSPITAL_COMMUNITY): Payer: PPO

## 2018-12-31 ENCOUNTER — Encounter (HOSPITAL_COMMUNITY)
Admission: RE | Disposition: A | Discharge status: Skilled Nursing Facility | Payer: Self-pay | Source: Home / Self Care | Attending: Orthopedic Surgery

## 2018-12-31 ENCOUNTER — Inpatient Hospital Stay (HOSPITAL_COMMUNITY)
Admission: RE | Admit: 2018-12-31 | Discharge: 2019-01-05 | DRG: 455 | Disposition: A | Discharge status: Skilled Nursing Facility | Payer: PPO | Attending: Orthopedic Surgery | Admitting: Orthopedic Surgery

## 2018-12-31 ENCOUNTER — Inpatient Hospital Stay (HOSPITAL_COMMUNITY): Payer: PPO | Admitting: Physician Assistant

## 2018-12-31 ENCOUNTER — Other Ambulatory Visit: Payer: Self-pay

## 2018-12-31 ENCOUNTER — Encounter (HOSPITAL_COMMUNITY): Payer: Self-pay

## 2018-12-31 ENCOUNTER — Inpatient Hospital Stay (HOSPITAL_COMMUNITY): Payer: PPO | Admitting: Anesthesiology

## 2018-12-31 DIAGNOSIS — M5410 Radiculopathy, site unspecified: Secondary | ICD-10-CM | POA: Diagnosis present

## 2018-12-31 DIAGNOSIS — E119 Type 2 diabetes mellitus without complications: Secondary | ICD-10-CM | POA: Diagnosis present

## 2018-12-31 DIAGNOSIS — Z86010 Personal history of colonic polyps: Secondary | ICD-10-CM

## 2018-12-31 DIAGNOSIS — Z79899 Other long term (current) drug therapy: Secondary | ICD-10-CM

## 2018-12-31 DIAGNOSIS — E785 Hyperlipidemia, unspecified: Secondary | ICD-10-CM | POA: Diagnosis present

## 2018-12-31 DIAGNOSIS — M4326 Fusion of spine, lumbar region: Secondary | ICD-10-CM | POA: Diagnosis not present

## 2018-12-31 DIAGNOSIS — M4316 Spondylolisthesis, lumbar region: Secondary | ICD-10-CM | POA: Diagnosis present

## 2018-12-31 DIAGNOSIS — I10 Essential (primary) hypertension: Secondary | ICD-10-CM | POA: Diagnosis present

## 2018-12-31 DIAGNOSIS — Z419 Encounter for procedure for purposes other than remedying health state, unspecified: Secondary | ICD-10-CM

## 2018-12-31 DIAGNOSIS — Z87891 Personal history of nicotine dependence: Secondary | ICD-10-CM

## 2018-12-31 DIAGNOSIS — M48061 Spinal stenosis, lumbar region without neurogenic claudication: Secondary | ICD-10-CM | POA: Diagnosis present

## 2018-12-31 DIAGNOSIS — M5416 Radiculopathy, lumbar region: Secondary | ICD-10-CM | POA: Diagnosis present

## 2018-12-31 DIAGNOSIS — M549 Dorsalgia, unspecified: Secondary | ICD-10-CM | POA: Diagnosis not present

## 2018-12-31 DIAGNOSIS — M79604 Pain in right leg: Secondary | ICD-10-CM | POA: Diagnosis present

## 2018-12-31 HISTORY — PX: TRANSFORAMINAL LUMBAR INTERBODY FUSION (TLIF) WITH PEDICLE SCREW FIXATION 2 LEVEL: SHX6142

## 2018-12-31 LAB — GLUCOSE, CAPILLARY
GLUCOSE-CAPILLARY: 159 mg/dL — AB (ref 70–99)
Glucose-Capillary: 112 mg/dL — ABNORMAL HIGH (ref 70–99)
Glucose-Capillary: 135 mg/dL — ABNORMAL HIGH (ref 70–99)

## 2018-12-31 SURGERY — TRANSFORAMINAL LUMBAR INTERBODY FUSION (TLIF) WITH PEDICLE SCREW FIXATION 2 LEVEL
Anesthesia: General | Site: Back | Laterality: Right

## 2018-12-31 MED ORDER — MENTHOL 3 MG MT LOZG: 1.0000 | LOZENGE | OROMUCOSAL | Status: DC | PRN

## 2018-12-31 MED ORDER — FENTANYL CITRATE (PF) 100 MCG/2ML IJ SOLN
INTRAMUSCULAR | Status: DC | PRN
  Administered 2018-12-31: 50 ug via INTRAVENOUS
  Administered 2018-12-31: 100 ug via INTRAVENOUS
  Administered 2018-12-31: 25 ug via INTRAVENOUS
  Administered 2018-12-31: 50 ug via INTRAVENOUS
  Administered 2018-12-31: 100 ug via INTRAVENOUS

## 2018-12-31 MED ORDER — METHYLENE BLUE 0.5 % INJ SOLN
INTRAVENOUS | Status: AC
  Filled 2018-12-31: qty 10

## 2018-12-31 MED ORDER — PROPOFOL 500 MG/50ML IV EMUL
INTRAVENOUS | Status: DC | PRN
  Administered 2018-12-31: 50 ug/kg/min via INTRAVENOUS

## 2018-12-31 MED ORDER — PROPOFOL 10 MG/ML IV BOLUS
INTRAVENOUS | Status: DC | PRN
  Administered 2018-12-31: 150 mg via INTRAVENOUS
  Administered 2018-12-31: 50 mg via INTRAVENOUS

## 2018-12-31 MED ORDER — EPHEDRINE 5 MG/ML INJ
INTRAVENOUS | Status: AC
  Filled 2018-12-31: qty 10

## 2018-12-31 MED ORDER — POTASSIUM CHLORIDE IN NACL 20-0.9 MEQ/L-% IV SOLN: INTRAVENOUS | Status: DC

## 2018-12-31 MED ORDER — ATORVASTATIN CALCIUM 40 MG PO TABS
40.0000 mg | ORAL_TABLET | Freq: Every day | ORAL | Status: DC
  Administered 2018-12-31 – 2019-01-05 (×6): 40 mg via ORAL
  Filled 2018-12-31 (×6): qty 1

## 2018-12-31 MED ORDER — HYDROCORTISONE 2.5 % RE CREA: 1.0000 "application " | TOPICAL_CREAM | Freq: Two times a day (BID) | RECTAL | Status: DC

## 2018-12-31 MED ORDER — ONDANSETRON HCL 4 MG PO TABS: 4.0000 mg | ORAL_TABLET | Freq: Four times a day (QID) | ORAL | Status: DC | PRN

## 2018-12-31 MED ORDER — BISACODYL 5 MG PO TBEC
5.0000 mg | DELAYED_RELEASE_TABLET | Freq: Every day | ORAL | Status: DC | PRN
  Administered 2019-01-05: 5 mg via ORAL
  Filled 2018-12-31: qty 1

## 2018-12-31 MED ORDER — BUPIVACAINE HCL (PF) 0.25 % IJ SOLN
INTRAMUSCULAR | Status: DC | PRN
  Administered 2018-12-31: 30 mL

## 2018-12-31 MED ORDER — GLYCOPYRROLATE PF 0.2 MG/ML IJ SOSY
PREFILLED_SYRINGE | INTRAMUSCULAR | Status: AC
  Filled 2018-12-31: qty 1

## 2018-12-31 MED ORDER — LIDOCAINE 2% (20 MG/ML) 5 ML SYRINGE
INTRAMUSCULAR | Status: DC | PRN
  Administered 2018-12-31: 80 mg via INTRAVENOUS

## 2018-12-31 MED ORDER — GABAPENTIN 300 MG PO CAPS
600.0000 mg | ORAL_CAPSULE | Freq: Every evening | ORAL | Status: DC
  Administered 2018-12-31 – 2019-01-05 (×6): 600 mg via ORAL
  Filled 2018-12-31 (×7): qty 2

## 2018-12-31 MED ORDER — AMLODIPINE BESYLATE 5 MG PO TABS
5.0000 mg | ORAL_TABLET | Freq: Every day | ORAL | Status: DC
  Administered 2019-01-02 – 2019-01-05 (×4): 5 mg via ORAL
  Filled 2018-12-31 (×5): qty 1

## 2018-12-31 MED ORDER — THROMBIN 20000 UNITS EX SOLR
CUTANEOUS | Status: AC
  Filled 2018-12-31: qty 20000

## 2018-12-31 MED ORDER — LINAGLIPTIN 5 MG PO TABS
5.0000 mg | ORAL_TABLET | Freq: Every day | ORAL | Status: DC
  Administered 2018-12-31 – 2019-01-05 (×6): 5 mg via ORAL
  Filled 2018-12-31 (×6): qty 1

## 2018-12-31 MED ORDER — EPHEDRINE SULFATE-NACL 50-0.9 MG/10ML-% IV SOSY
PREFILLED_SYRINGE | INTRAVENOUS | Status: DC | PRN
  Administered 2018-12-31 (×3): 10 mg via INTRAVENOUS
  Administered 2018-12-31 (×2): 5 mg via INTRAVENOUS
  Administered 2018-12-31: 10 mg via INTRAVENOUS

## 2018-12-31 MED ORDER — ALUM & MAG HYDROXIDE-SIMETH 200-200-20 MG/5ML PO SUSP: 30.0000 mL | Freq: Four times a day (QID) | ORAL | Status: DC | PRN

## 2018-12-31 MED ORDER — SENNOSIDES-DOCUSATE SODIUM 8.6-50 MG PO TABS
1.0000 | ORAL_TABLET | Freq: Every evening | ORAL | Status: DC | PRN
  Administered 2019-01-03: 1 via ORAL
  Filled 2018-12-31: qty 1

## 2018-12-31 MED ORDER — KETAMINE HCL 100 MG/ML IJ SOLN
INTRAMUSCULAR | Status: AC
  Filled 2018-12-31: qty 2

## 2018-12-31 MED ORDER — SODIUM CHLORIDE 0.9% FLUSH
3.0000 mL | Freq: Two times a day (BID) | INTRAVENOUS | Status: DC
  Administered 2019-01-02: 3 mL via INTRAVENOUS

## 2018-12-31 MED ORDER — SODIUM CHLORIDE 0.9 % IV SOLN: 250.0000 mL | INTRAVENOUS | Status: DC

## 2018-12-31 MED ORDER — ROCURONIUM BROMIDE 50 MG/5ML IV SOSY
PREFILLED_SYRINGE | INTRAVENOUS | Status: AC
  Filled 2018-12-31: qty 5

## 2018-12-31 MED ORDER — ROCURONIUM BROMIDE 50 MG/5ML IV SOSY
PREFILLED_SYRINGE | INTRAVENOUS | Status: AC
  Filled 2018-12-31: qty 10

## 2018-12-31 MED ORDER — LACTATED RINGERS IV SOLN
INTRAVENOUS | Status: DC | PRN
  Administered 2018-12-31 (×2): via INTRAVENOUS

## 2018-12-31 MED ORDER — ROCURONIUM BROMIDE 10 MG/ML (PF) SYRINGE
PREFILLED_SYRINGE | INTRAVENOUS | Status: DC | PRN
  Administered 2018-12-31: 30 mg via INTRAVENOUS
  Administered 2018-12-31: 50 mg via INTRAVENOUS
  Administered 2018-12-31: 20 mg via INTRAVENOUS

## 2018-12-31 MED ORDER — ACETAMINOPHEN 500 MG PO TABS
1000.0000 mg | ORAL_TABLET | Freq: Once | ORAL | Status: AC
  Administered 2018-12-31: 1000 mg via ORAL
  Filled 2018-12-31: qty 2

## 2018-12-31 MED ORDER — MORPHINE SULFATE (PF) 2 MG/ML IV SOLN: 1.0000 mg | INTRAVENOUS | Status: DC | PRN

## 2018-12-31 MED ORDER — THROMBIN 20000 UNITS EX KIT
PACK | CUTANEOUS | Status: DC | PRN
  Administered 2018-12-31: 20 mL via TOPICAL

## 2018-12-31 MED ORDER — ACETAMINOPHEN 325 MG PO TABS
650.0000 mg | ORAL_TABLET | ORAL | Status: DC | PRN
  Administered 2019-01-02: 650 mg via ORAL
  Filled 2018-12-31: qty 2

## 2018-12-31 MED ORDER — FLEET ENEMA 7-19 GM/118ML RE ENEM
1.0000 | ENEMA | Freq: Once | RECTAL | Status: AC | PRN
  Administered 2019-01-05: 1 via RECTAL
  Filled 2018-12-31: qty 1

## 2018-12-31 MED ORDER — BUPIVACAINE LIPOSOME 1.3 % IJ SUSP
20.0000 mL | INTRAMUSCULAR | Status: AC
  Administered 2018-12-31: 20 mL
  Filled 2018-12-31: qty 20

## 2018-12-31 MED ORDER — PANTOPRAZOLE SODIUM 40 MG PO TBEC
40.0000 mg | DELAYED_RELEASE_TABLET | Freq: Every evening | ORAL | Status: DC
  Administered 2018-12-31 – 2019-01-05 (×6): 40 mg via ORAL
  Filled 2018-12-31 (×6): qty 1

## 2018-12-31 MED ORDER — BUPIVACAINE HCL (PF) 0.25 % IJ SOLN
INTRAMUSCULAR | Status: AC
  Filled 2018-12-31: qty 30

## 2018-12-31 MED ORDER — DOCUSATE SODIUM 100 MG PO CAPS
100.0000 mg | ORAL_CAPSULE | Freq: Two times a day (BID) | ORAL | Status: DC
  Administered 2018-12-31 – 2019-01-05 (×10): 100 mg via ORAL
  Filled 2018-12-31 (×10): qty 1

## 2018-12-31 MED ORDER — LIDOCAINE 4 % EX PTCH: 1.0000 | MEDICATED_PATCH | Freq: Every day | CUTANEOUS | Status: DC | PRN

## 2018-12-31 MED ORDER — ONDANSETRON HCL 4 MG/2ML IJ SOLN: 4.0000 mg | Freq: Four times a day (QID) | INTRAMUSCULAR | Status: DC | PRN

## 2018-12-31 MED ORDER — CEFAZOLIN SODIUM-DEXTROSE 2-4 GM/100ML-% IV SOLN
2.0000 g | INTRAVENOUS | Status: AC
  Administered 2018-12-31 (×2): 2 g via INTRAVENOUS

## 2018-12-31 MED ORDER — EPINEPHRINE PF 1 MG/ML IJ SOLN
INTRAMUSCULAR | Status: AC
  Filled 2018-12-31: qty 1

## 2018-12-31 MED ORDER — ONDANSETRON HCL 4 MG/2ML IJ SOLN: 4.0000 mg | Freq: Once | INTRAMUSCULAR | Status: DC | PRN

## 2018-12-31 MED ORDER — EPINEPHRINE PF 1 MG/ML IJ SOLN
INTRAMUSCULAR | Status: DC | PRN
  Administered 2018-12-31: .15 mL

## 2018-12-31 MED ORDER — PROPOFOL 1000 MG/100ML IV EMUL
INTRAVENOUS | Status: AC
  Filled 2018-12-31: qty 100

## 2018-12-31 MED ORDER — LISINOPRIL 20 MG PO TABS
20.0000 mg | ORAL_TABLET | Freq: Every day | ORAL | Status: DC
  Administered 2018-12-31 – 2019-01-05 (×5): 20 mg via ORAL
  Filled 2018-12-31 (×6): qty 1

## 2018-12-31 MED ORDER — FENTANYL CITRATE (PF) 100 MCG/2ML IJ SOLN
INTRAMUSCULAR | Status: AC
  Administered 2018-12-31: 25 ug via INTRAVENOUS
  Filled 2018-12-31: qty 2

## 2018-12-31 MED ORDER — PROPOFOL 10 MG/ML IV BOLUS
INTRAVENOUS | Status: AC
  Filled 2018-12-31: qty 20

## 2018-12-31 MED ORDER — PHENYLEPHRINE 40 MCG/ML (10ML) SYRINGE FOR IV PUSH (FOR BLOOD PRESSURE SUPPORT)
PREFILLED_SYRINGE | INTRAVENOUS | Status: DC | PRN
  Administered 2018-12-31: 40 ug via INTRAVENOUS
  Administered 2018-12-31 (×4): 80 ug via INTRAVENOUS

## 2018-12-31 MED ORDER — VITAMIN D3 25 MCG (1000 UNIT) PO TABS
5000.0000 [IU] | ORAL_TABLET | ORAL | Status: DC
  Administered 2019-01-01 – 2019-01-04 (×2): 5000 [IU] via ORAL
  Filled 2018-12-31 (×3): qty 5

## 2018-12-31 MED ORDER — SODIUM CHLORIDE 0.9 % IV SOLN
INTRAVENOUS | Status: DC | PRN
  Administered 2018-12-31: 50 ug/min via INTRAVENOUS
  Administered 2018-12-31: 12:00:00 via INTRAVENOUS

## 2018-12-31 MED ORDER — FENTANYL CITRATE (PF) 250 MCG/5ML IJ SOLN
INTRAMUSCULAR | Status: AC
  Filled 2018-12-31: qty 5

## 2018-12-31 MED ORDER — 0.9 % SODIUM CHLORIDE (POUR BTL) OPTIME
TOPICAL | Status: DC | PRN
  Administered 2018-12-31: 2000 mL
  Administered 2018-12-31: 1000 mL
  Administered 2018-12-31: 2000 mL

## 2018-12-31 MED ORDER — KETAMINE HCL 50 MG/5ML IJ SOSY
PREFILLED_SYRINGE | INTRAMUSCULAR | Status: AC
  Filled 2018-12-31: qty 10

## 2018-12-31 MED ORDER — POVIDONE-IODINE 7.5 % EX SOLN
Freq: Once | CUTANEOUS | Status: DC
  Filled 2018-12-31: qty 118

## 2018-12-31 MED ORDER — DIAZEPAM 5 MG PO TABS
5.0000 mg | ORAL_TABLET | Freq: Four times a day (QID) | ORAL | Status: DC | PRN
  Administered 2018-12-31 – 2019-01-02 (×3): 5 mg via ORAL
  Filled 2018-12-31 (×3): qty 1

## 2018-12-31 MED ORDER — FENTANYL CITRATE (PF) 100 MCG/2ML IJ SOLN
25.0000 ug | INTRAMUSCULAR | Status: DC | PRN
  Administered 2018-12-31 (×2): 25 ug via INTRAVENOUS

## 2018-12-31 MED ORDER — METHYLENE BLUE 0.5 % INJ SOLN
INTRAVENOUS | Status: DC | PRN
  Administered 2018-12-31: .2 mL via INTRADERMAL

## 2018-12-31 MED ORDER — ZOLPIDEM TARTRATE 5 MG PO TABS
5.0000 mg | ORAL_TABLET | Freq: Every evening | ORAL | Status: DC | PRN
  Administered 2019-01-04: 5 mg via ORAL
  Filled 2018-12-31: qty 1

## 2018-12-31 MED ORDER — PHENOL 1.4 % MT LIQD
1.0000 | OROMUCOSAL | Status: DC | PRN
  Administered 2019-01-02 – 2019-01-03 (×2): 1 via OROMUCOSAL
  Filled 2018-12-31: qty 177

## 2018-12-31 MED ORDER — PHENYLEPHRINE 40 MCG/ML (10ML) SYRINGE FOR IV PUSH (FOR BLOOD PRESSURE SUPPORT)
PREFILLED_SYRINGE | INTRAVENOUS | Status: AC
  Filled 2018-12-31: qty 10

## 2018-12-31 MED ORDER — ACETAMINOPHEN 650 MG RE SUPP: 650.0000 mg | RECTAL | Status: DC | PRN

## 2018-12-31 MED ORDER — GLYCOPYRROLATE 0.2 MG/ML IJ SOLN
INTRAMUSCULAR | Status: DC | PRN
  Administered 2018-12-31: 0.2 mg via INTRAVENOUS

## 2018-12-31 MED ORDER — ONDANSETRON HCL 4 MG/2ML IJ SOLN
INTRAMUSCULAR | Status: DC | PRN
  Administered 2018-12-31: 4 mg via INTRAVENOUS

## 2018-12-31 MED ORDER — LACTATED RINGERS IV SOLN
INTRAVENOUS | Status: DC | PRN
  Administered 2018-12-31 (×2): via INTRAVENOUS

## 2018-12-31 MED ORDER — METOPROLOL TARTRATE 50 MG PO TABS
50.0000 mg | ORAL_TABLET | Freq: Every evening | ORAL | Status: DC
  Administered 2018-12-31 – 2019-01-05 (×5): 50 mg via ORAL
  Filled 2018-12-31 (×3): qty 1
  Filled 2018-12-31: qty 2
  Filled 2018-12-31 (×2): qty 1

## 2018-12-31 MED ORDER — LORATADINE 10 MG PO TABS
10.0000 mg | ORAL_TABLET | Freq: Every day | ORAL | Status: DC
  Administered 2019-01-02 – 2019-01-05 (×4): 10 mg via ORAL
  Filled 2018-12-31 (×5): qty 1

## 2018-12-31 MED ORDER — KETAMINE HCL 10 MG/ML IJ SOLN
INTRAMUSCULAR | Status: DC | PRN
  Administered 2018-12-31 (×2): 10 mg via INTRAVENOUS
  Administered 2018-12-31: 30 mg via INTRAVENOUS
  Administered 2018-12-31 (×2): 10 mg via INTRAVENOUS

## 2018-12-31 MED ORDER — CEFAZOLIN SODIUM 1 G IJ SOLR
INTRAMUSCULAR | Status: AC
  Filled 2018-12-31: qty 20

## 2018-12-31 MED ORDER — CEFAZOLIN SODIUM-DEXTROSE 2-4 GM/100ML-% IV SOLN
INTRAVENOUS | Status: AC
  Filled 2018-12-31: qty 100

## 2018-12-31 MED ORDER — OXYCODONE-ACETAMINOPHEN 5-325 MG PO TABS
1.0000 | ORAL_TABLET | ORAL | Status: DC | PRN
  Administered 2018-12-31: 2 via ORAL
  Administered 2019-01-01 – 2019-01-03 (×9): 1 via ORAL
  Administered 2019-01-04: 2 via ORAL
  Administered 2019-01-05 (×2): 1 via ORAL
  Filled 2018-12-31 (×5): qty 1
  Filled 2018-12-31: qty 2
  Filled 2018-12-31: qty 1
  Filled 2018-12-31: qty 2
  Filled 2018-12-31: qty 1
  Filled 2018-12-31 (×2): qty 2
  Filled 2018-12-31 (×3): qty 1

## 2018-12-31 MED ORDER — SODIUM CHLORIDE 0.9% FLUSH
3.0000 mL | INTRAVENOUS | Status: DC | PRN
  Administered 2019-01-01: 3 mL via INTRAVENOUS
  Filled 2018-12-31: qty 3

## 2018-12-31 MED ORDER — CEFAZOLIN SODIUM-DEXTROSE 2-4 GM/100ML-% IV SOLN
2.0000 g | Freq: Three times a day (TID) | INTRAVENOUS | Status: AC
  Administered 2018-12-31 – 2019-01-01 (×2): 2 g via INTRAVENOUS
  Filled 2018-12-31 (×2): qty 100

## 2018-12-31 MED ORDER — ONDANSETRON HCL 4 MG/2ML IJ SOLN
INTRAMUSCULAR | Status: AC
  Filled 2018-12-31: qty 2

## 2018-12-31 SURGICAL SUPPLY — 88 items
BENZOIN TINCTURE PRP APPL 2/3 (GAUZE/BANDAGES/DRESSINGS) ×2 IMPLANT
BLADE CLIPPER SURG (BLADE) IMPLANT
BONE VIVIGEN FORMABLE 10CC (Bone Implant) ×6 IMPLANT
BUR PRESCISION 1.7 ELITE (BURR) ×2 IMPLANT
BUR ROUND FLUTED 5 RND (BURR) ×2 IMPLANT
BUR ROUND PRECISION 4.0 (BURR) IMPLANT
BUR SABER RD CUTTING 3.0 (BURR) IMPLANT
CAGE CONCORDE BULLET 9X11X27 (Cage) ×6 IMPLANT
CAGE SPNL 5D BLT NOSE 27X9X11 (Cage) ×3 IMPLANT
CARTRIDGE OIL MAESTRO DRILL (MISCELLANEOUS) ×1 IMPLANT
CLSR STERI-STRIP ANTIMIC 1/2X4 (GAUZE/BANDAGES/DRESSINGS) ×2 IMPLANT
CONT SPEC 4OZ CLIKSEAL STRL BL (MISCELLANEOUS) ×2 IMPLANT
COVER MAYO STAND STRL (DRAPES) ×4 IMPLANT
COVER SURGICAL LIGHT HANDLE (MISCELLANEOUS) ×2 IMPLANT
COVER WAND RF STERILE (DRAPES) ×2 IMPLANT
CROSSLINK EXPEDIUM SFX A1 (Neuro Prosthesis/Implant) ×2 IMPLANT
DIFFUSER DRILL AIR PNEUMATIC (MISCELLANEOUS) ×2 IMPLANT
DRAIN CHANNEL 15F RND FF W/TCR (WOUND CARE) IMPLANT
DRAPE C-ARM 42X72 X-RAY (DRAPES) ×2 IMPLANT
DRAPE C-ARMOR (DRAPES) IMPLANT
DRAPE POUCH INSTRU U-SHP 10X18 (DRAPES) ×2 IMPLANT
DRAPE SURG 17X23 STRL (DRAPES) ×8 IMPLANT
DURAPREP 26ML APPLICATOR (WOUND CARE) ×2 IMPLANT
ELECT BLADE 4.0 EZ CLEAN MEGAD (MISCELLANEOUS) ×2
ELECT CAUTERY BLADE 6.4 (BLADE) ×2 IMPLANT
ELECT REM PT RETURN 9FT ADLT (ELECTROSURGICAL) ×2
ELECTRODE BLDE 4.0 EZ CLN MEGD (MISCELLANEOUS) ×1 IMPLANT
ELECTRODE REM PT RTRN 9FT ADLT (ELECTROSURGICAL) ×1 IMPLANT
EVACUATOR SILICONE 100CC (DRAIN) IMPLANT
FEE INTRAOP MONITOR IMPULS NCS (MISCELLANEOUS) ×1 IMPLANT
FILTER STRAW FLUID ASPIR (MISCELLANEOUS) ×2 IMPLANT
GAUZE 4X4 16PLY RFD (DISPOSABLE) ×2 IMPLANT
GAUZE SPONGE 4X4 12PLY STRL (GAUZE/BANDAGES/DRESSINGS) ×2 IMPLANT
GLOVE BIO SURGEON STRL SZ7 (GLOVE) ×4 IMPLANT
GLOVE BIO SURGEON STRL SZ8 (GLOVE) ×2 IMPLANT
GLOVE BIOGEL PI IND STRL 7.0 (GLOVE) ×2 IMPLANT
GLOVE BIOGEL PI IND STRL 8 (GLOVE) ×1 IMPLANT
GLOVE BIOGEL PI INDICATOR 7.0 (GLOVE) ×2
GLOVE BIOGEL PI INDICATOR 8 (GLOVE) ×1
GOWN STRL REUS W/ TWL LRG LVL3 (GOWN DISPOSABLE) ×2 IMPLANT
GOWN STRL REUS W/ TWL XL LVL3 (GOWN DISPOSABLE) ×1 IMPLANT
GOWN STRL REUS W/TWL LRG LVL3 (GOWN DISPOSABLE) ×4
GOWN STRL REUS W/TWL XL LVL3 (GOWN DISPOSABLE) ×2
INTRAOP MONITOR FEE IMPULS NCS (MISCELLANEOUS) ×1
INTRAOP MONITOR FEE IMPULSE (MISCELLANEOUS) ×1
IV CATH 14GX2 1/4 (CATHETERS) ×2 IMPLANT
KIT BASIN OR (CUSTOM PROCEDURE TRAY) ×2 IMPLANT
KIT POSITION SURG JACKSON T1 (MISCELLANEOUS) ×2 IMPLANT
KIT TURNOVER KIT B (KITS) ×2 IMPLANT
MARKER SKIN DUAL TIP RULER LAB (MISCELLANEOUS) ×4 IMPLANT
NDL SAFETY ECLIPSE 18X1.5 (NEEDLE) ×1 IMPLANT
NEEDLE 22X1 1/2 (OR ONLY) (NEEDLE) ×4 IMPLANT
NEEDLE HYPO 18GX1.5 SHARP (NEEDLE) ×2
NEEDLE HYPO 25GX1X1/2 BEV (NEEDLE) ×2 IMPLANT
NEEDLE SPNL 18GX3.5 QUINCKE PK (NEEDLE) ×4 IMPLANT
NS IRRIG 1000ML POUR BTL (IV SOLUTION) ×4 IMPLANT
OIL CARTRIDGE MAESTRO DRILL (MISCELLANEOUS) ×2
PACK LAMINECTOMY ORTHO (CUSTOM PROCEDURE TRAY) ×2 IMPLANT
PACK UNIVERSAL I (CUSTOM PROCEDURE TRAY) ×2 IMPLANT
PAD ARMBOARD 7.5X6 YLW CONV (MISCELLANEOUS) ×4 IMPLANT
PATTIES SURGICAL .5 X1 (DISPOSABLE) ×2 IMPLANT
PATTIES SURGICAL .5X1.5 (GAUZE/BANDAGES/DRESSINGS) ×2 IMPLANT
PROBE PED EXPEDIUM STRT (INSTRUMENTS) ×2 IMPLANT
PROBE PED SCREW MONOP 3 BT NCS (MISCELLANEOUS) ×1 IMPLANT
ROD EXPEDIUM PREBENT 85MM (Rod) ×2 IMPLANT
ROD EXPEDIUM PREBENT 95MM (Rod) ×2 IMPLANT
SCREW CORTICAL VIPER 7X40MM (Screw) ×4 IMPLANT
SCREW PROBE PEDICLE MONOPOLAR (MISCELLANEOUS) ×1
SCREW SET SINGLE INNER (Screw) ×16 IMPLANT
SCREW VIPER CORT FIX 6X35 (Screw) ×12 IMPLANT
SPONGE INTESTINAL PEANUT (DISPOSABLE) ×2 IMPLANT
SPONGE SURGIFOAM ABS GEL 100 (HEMOSTASIS) ×2 IMPLANT
STRIP CLOSURE SKIN 1/2X4 (GAUZE/BANDAGES/DRESSINGS) ×4 IMPLANT
SURGIFLO W/THROMBIN 8M KIT (HEMOSTASIS) IMPLANT
SUT MNCRL AB 4-0 PS2 18 (SUTURE) ×2 IMPLANT
SUT VIC AB 0 CT1 18XCR BRD 8 (SUTURE) ×1 IMPLANT
SUT VIC AB 0 CT1 8-18 (SUTURE) ×2
SUT VIC AB 1 CT1 18XCR BRD 8 (SUTURE) ×1 IMPLANT
SUT VIC AB 1 CT1 8-18 (SUTURE) ×2
SUT VIC AB 2-0 CT2 18 VCP726D (SUTURE) ×2 IMPLANT
SYR 20CC LL (SYRINGE) ×4 IMPLANT
SYR BULB IRRIGATION 50ML (SYRINGE) ×2 IMPLANT
SYR CONTROL 10ML LL (SYRINGE) ×4 IMPLANT
SYR TB 1ML LUER SLIP (SYRINGE) ×2 IMPLANT
TAPE CLOTH SURG 6X10 WHT LF (GAUZE/BANDAGES/DRESSINGS) ×2 IMPLANT
TRAY FOLEY MTR SLVR 16FR STAT (SET/KITS/TRAYS/PACK) ×2 IMPLANT
WATER STERILE IRR 1000ML POUR (IV SOLUTION) ×2 IMPLANT
YANKAUER SUCT BULB TIP NO VENT (SUCTIONS) ×2 IMPLANT

## 2018-12-31 NOTE — Anesthesia Procedure Notes (Signed)
Arterial Line Insertion Start/End2/13/2020 8:08 AM, 12/31/2018 8:11 AM Performed by: Murvin Natal, MD, Eulas Post, Nickol Collister W, CRNA, CRNA  Patient location: OR. Preanesthetic checklist: patient identified, IV checked, site marked, risks and benefits discussed, surgical consent, monitors and equipment checked, pre-op evaluation, timeout performed and anesthesia consent Lidocaine 1% used for infiltration Left, radial was placed Catheter size: 20 Fr Hand hygiene performed  and maximum sterile barriers used   Attempts: 1 Procedure performed without using ultrasound guided technique. Following insertion, dressing applied. Post procedure assessment: normal and unchanged

## 2018-12-31 NOTE — H&P (Signed)
PREOPERATIVE H&P  Chief Complaint: Right leg pain  HPI: Felicia Shelton is a 78 y.o. female who presents with ongoing pain in the right leg  MRI reveals stenosis spanning L2-L5 with a spondylolisthesis spanning L2-L5  Patient has failed multiple forms of conservative care and continues to have pain (see office notes for additional details regarding the patient's full course of treatment)  Past Medical History:  Diagnosis Date  . Allergy   . Arthritis    back  . Diabetes mellitus without complication ([place])   . Hx of adenomatous colonic polyp 05/26/2008  . Hyperlipidemia   . Hypertension   . Neuromuscular disorder [facility])    Past Surgical History:  Procedure Laterality Date  . ABDOMINAL HYSTERECTOMY    . APPENDECTOMY    . BREAST EXCISIONAL BIOPSY Left   . BREAST SURGERY     left breast  . TONSILLECTOMY     removed age [age]   Social History   Socioeconomic History  . Marital status: Married    Spouse name: Not on file  . Number of children: Not on file  . Years of education: Not on file  . Highest education level: Not on file  Occupational History  . Not on file  Social Needs  . Financial resource strain: Not on file  . Food insecurity:    Worry: Not on file    Inability: Not on file  . Transportation needs:    Medical: Not on file    Non-medical: Not on file  Tobacco Use  . Smoking status: Former Smoker    Packs/day: 0.00    Last attempt to quit: 08/18/2018    Years since quitting: 0.3  . Smokeless tobacco: Never Used  Substance and Sexual Activity  . Alcohol use: Yes    Alcohol/week: 0.0 standard drinks    Comment: occasional beer  . Drug use: No  . Sexual activity: Not on file  Lifestyle  . Physical activity:    Days per week: Not on file    Minutes per session: Not on file  . Stress: Not on file  Relationships  . Social connections:    Talks on phone: Not on file    Gets together: Not on file    Attends religious service: Not on file    Active  member of club or organization: Not on file    Attends meetings of clubs or organizations: Not on file    Relationship status: Not on file  Other Topics Concern  . Not on file  Social History Narrative  . Not on file   Family History  Adopted: Yes  Problem Relation Age of Onset  . Colon cancer Maternal Aunt    No Known Allergies Prior to Admission medications   Medication Sig Start Date End Date Taking? Authorizing Provider  acetaminophen (TYLENOL) 650 MG CR tablet Take 1,300 mg by mouth daily as needed for pain.   Yes [provider]  amLODipine (NORVASC) 5 MG tablet Take 5 mg by mouth daily.    Yes [provider]  atorvastatin (LIPITOR) 40 MG tablet Take 40 mg by mouth daily. 11/26/18  Yes [provider]  Cholecalciferol (VITAMIN D-3) 5000 units TABS Take 5,000 Units by mouth every Monday, Wednesday, and Friday.    Yes [provider]  diclofenac (VOLTAREN) 75 MG EC tablet Take 75 mg by mouth 2 (two) times daily as needed (pain.).    Yes [provider]  fexofenadine-pseudoephedrine (ALLEGRA-D 24)  180-240 MG 24 hr tablet Take 1 tablet by mouth daily as needed (allergies.).   Yes [provider]  gabapentin (NEURONTIN) 300 MG capsule Take 600 mg by mouth every evening.   Yes [provider]  JANUVIA 100 MG tablet Take 100 mg by mouth daily.  03/18/18  Yes [provider]  lisinopril (PRINIVIL,ZESTRIL) 20 MG tablet Take 20 mg by mouth daily.   Yes [provider]  metoprolol tartrate (LOPRESSOR) 50 MG tablet Take 50 mg by mouth every evening.  03/31/18  Yes [provider]  Nerve Stimulator (TENS WIRED PAIN MANAGEMENT) DEVI 1 Dose by Does not apply route See admin instructions. Use for up to 1.5 hours (in 30 minutes intervals) as needed for pain.   Yes [provider]  pantoprazole (PROTONIX) 40 MG tablet Take 40 mg by mouth every evening.  08/11/18  Yes [provider]  hydrocortisone  (ANUSOL-HC) 2.5 % rectal cream Place 1 application rectally 2 (two) times daily as needed for hemorrhoids or itching. 10/11/15   Gatha Mayer, MD  Lidocaine (ASPERCREME LIDOCAINE) 4 % PTCH Place 1 patch onto the skin daily as needed (pain.).    [provider]     All other systems have been reviewed and were otherwise negative with the exception of those mentioned in the HPI and as above.  Physical Exam: Vitals:   12/31/18 0555  BP: (!) 165/69  Pulse: 71  Resp: 18  Temp: 98.3 F (36.8 C)  SpO2: 97%    Body mass index is 26.99 kg/m.  General: Alert, no acute distress Cardiovascular: No pedal edema Respiratory: No cyanosis, no use of accessory musculature Skin: No lesions in the area of chief complaint Neurologic: Sensation intact distally Psychiatric: Patient is competent for consent with normal mood and affect Lymphatic: No axillary or cervical lymphadenopathy  MUSCULOSKELETAL: + SLR on the right  Assessment/Plan: SPINAL STENOSIS SPANNING LUMBAR 2 - LUMBAR 5 Plan for Procedure(s): RIGHT - SIDED LUMBAR 2-3,LUMBAR 3-4, LUMBAR 4-5 TRANSFORAMINAL LUMBAR INTERBODY FUSION WITH INSTRUMENTATION AND ALLOGRAFT   Norva Karvonen, MD 12/31/2018 6:45 AM

## 2018-12-31 NOTE — Anesthesia Procedure Notes (Signed)
Procedure Name: Intubation Date/Time: 12/31/2018 7:45 AM Performed by: Lieutenant Diego, CRNA Pre-anesthesia Checklist: Patient identified, Emergency Drugs available, Suction available and Patient being monitored Patient Re-evaluated:Patient Re-evaluated prior to induction Oxygen Delivery Method: Circle system utilized Preoxygenation: Pre-oxygenation with 100% oxygen Induction Type: IV induction Ventilation: Mask ventilation without difficulty Laryngoscope Size: Miller, 3 and Glidescope Grade View: Grade II Tube type: Oral Number of attempts: 1 Airway Equipment and Method: Stylet and Oral airway Placement Confirmation: ETT inserted through vocal cords under direct vision,  positive ETCO2 and breath sounds checked- equal and bilateral Secured at: 22 (lip) cm Tube secured with: Tape Dental Injury: Teeth and Oropharynx as per pre-operative assessment  Difficulty Due To: Difficult Airway- due to anterior larynx and Difficult Airway- due to dentition Future Recommendations: Recommend- induction with short-acting agent, and alternative techniques readily available Comments: DL  X 1 Miller 3, protruding teeth limited visualization. Grade one view with glide scope, easy intubation with glide.

## 2018-12-31 NOTE — Op Note (Signed)
PATIENT NAME: Felicia Shelton RECORD NO.:   [id number]    PHYSICIAN:  Phylliss Bob, MD      DATE OF BIRTH: 05/21/1941   DATE OF PROCEDURE: 12/31/2018                                OPERATIVE REPORT     PREOPERATIVE DIAGNOSES: 1. Severe spinal stenosis L2-3, L3-4, L4-5 2. Neurogenic claudication 3. L2-3, L3-4, L4-5 spondylolisthesis   POSTOPERATIVE DIAGNOSES: 1. Severe spinal stenosis L2-3, L3-4, L4-5 2. Neurogenic claudication 3. L2-3, L3-4, L4-5 spondylolisthesis   PROCEDURES: 1. Lumbar decompression, L2-3,L3-4, L4-5, including bilateral partial facetectomy, and bilateral lumbar decompression 2. right-sided L2-3, L3-4, L4-5 transforaminal lumbar interbody fusion. 3. left-sided L2-3, L3-4, L4-5 posterolateral fusion. 4. Insertion of interbody device x 3 (Concorde intervertebral spacers). 5. Placement of segmental posterior instrumentation L2, L3, L4, L5, bilaterally. 6. Use of local autograft. 7. Use of morselized allograft - ViviGen. 8. Intraoperative use of fluoroscopy.   SURGEON:  Phylliss Bob, MD.   ASSISTANTPricilla Holm, PA-C.   ANESTHESIA:  General endotracheal anesthesia.   COMPLICATIONS:  None.   DISPOSITION:  Stable.   ESTIMATED BLOOD LOSS:  200cc   INDICATIONS FOR SURGERY:  Briefly, Felicia Shelton is a pleasant 78 y.o. patient who did present to me with severe and ongoing pain in the right greater than left legs. I did feel that the symptoms were secondary to the findings noted above.  The patient failed conservative care and did wish to proceed with the procedure  noted above.    OPERATIVE DETAILS:  On 12/31/2018, the patient was brought to surgery and general endotracheal anesthesia was administered.  The patient was placed prone on a well-padded flat Jackson bed with a spinal frame.  Antibiotics were given and a time-out procedure was performed. The back was prepped and draped in the usual fashion.  A midline incision was made overlying the L2-3,  L3-4 and L4-5 intervertebral spaces.  The fascia was incised at the midline.  The paraspinal musculature was bluntly swept laterally.  Anatomic landmarks for the pedicles were exposed. Using fluoroscopy, I did cannulate the L2, L3, L4, and L5 pedicles bilaterally, using a medial to lateral cortical trajectory technique.  On the left side, the posterolateral gutter and facet joints at L3-4 and L4-5 were decorticated and 6 mm screws of the appropriate length were placed at L2, L3, L4, and a 39mm was placed at the L5 pedicles and a 95-mm rod was placed and distraction was applied across the rod at each intervertebral level.  On the right side, the cannulated pedicle holes were filled with bone wax.    I then proceeded with the decompressive aspect of the procedure.     Starting at L4-5, I did perform a laminotomy and a full facetectomy on the right.  I was able to thoroughly and entirely decompress the L4-5 intervertebral space bilaterally, removing facet hypertrophy and ligamentum flavum hypertrophy.  At this point, with an assistant holding medial retraction of the traversing right L5 nerve, I did perform a thorough and complete L4-5 intervertebral discectomy.  The intervertebral space was then liberally packed with autograft from the decompression, as well as allograft in the form of ViviGen, as was the appropriately sized intervertebral spacer (47mm lordotic).  Distraction was then released on the contralateral left side.    I then turned my attention to the L3-4 level.  Once  again, it was clearly evident that there was stenosis at the L3-4 level.  The stenosis was thoroughly and adequately decompressed by performing a bilateral partial facetectomy.  I was able to thoroughly decompress both the right L3 and right L4 nerves. With the assistant holding medial retraction of the traversing right L4 nerve, I did perform an annulotomy at the posterolateral aspect of the L3-4 intervertebral space.  I then used  a series of curettes and pituitary rongeurs to perform a thorough and complete intervertebral diskectomy.  The intervertebral space was then liberally packed with autograft as well as allograft in the form of ViviGen, as was the appropriate-sized intervertebral spacer (22mm lordotic).  The spacer was then tamped into position in the usual fashion.  I was very pleased with the press-fit of the spacer.    I then turned my attention to the L2-3 level.  Once again, it was clearly evident that there was stenosis at the L2-3 level.  The stenosis was thoroughly and adequately decompressed by performing a bilateral partial facetectomy.  I was able to thoroughly decompress both the right L2 and right L3 nerves. With the assistant holding medial retraction of the traversing right L3 nerve, I did perform an annulotomy at the posterolateral aspect of the L2-3 intervertebral space.  I then used a series of curettes and pituitary rongeurs to perform a thorough and complete intervertebral diskectomy.  The intervertebral space was then liberally packed with autograft as well as allograft in the form of ViviGen, as was the appropriate-sized intervertebral spacer (23mm lordotic).  The spacer was then tamped into position in the usual fashion.  I was very pleased with the press-fit of the spacer.   I then placed 6 mm screws on the right at L2, L3, L4, and a 49mm screw at L5.  A 85-mm rod was then placed and caps were placed. The distraction was then released on the contralateral left side.  All 8 caps were then locked.  A croslink was then placed. The wound was copiously irrigated with a total of approximately 3 L prior to placing the bone graft.  Additional autograft and allograft were then packed into the posterolateral gutter on the right side to help aid in the success of the fusion.  The wound was  explored for any undue bleeding and there was no substantial bleeding encountered.  Gel-Foam was placed over the  laminectomy site.  The wound was then closed in layers using #1 Vicryl followed by 2-0 Vicryl, followed by 4-0 Monocryl.  Benzoin and Steri-Strips were applied followed by sterile dressing.     Of note, I did use triggered EMG to test the screws on the left, and there is no screw the tested below 15 mA. There was no sustained abnormal EMG activity noted throughout the entire surgery.   Of note, Pricilla Holm was my assistant throughout surgery, and did aid in retraction, suctioning, and closure.       Phylliss Bob, MD

## 2018-12-31 NOTE — Progress Notes (Signed)
Orthopedic Tech Progress Note Patient Details:  Felicia Shelton [birth year] [id number]  Patient ID: [name], female   DOB: [birth year], 78 y.o.   MRN: [id number] Pt has brace at home.   Karolee Stamps 12/31/2018, 4:24 PM

## 2018-12-31 NOTE — Transfer of Care (Signed)
Immediate Anesthesia Transfer of Care Note  Patient: Felicia Shelton  Procedure(s) Performed: RIGHT - SIDED LUMBAR 2-3,LUMBAR 3-4, LUMBAR 4-5 TRANSFORAMINAL LUMBAR INTERBODY FUSION WITH INSTRUMENTATION AND ALLOGRAFT (Right Back)  Patient Location: PACU  Anesthesia Type:General  Level of Consciousness: awake and drowsy  Airway & Oxygen Therapy: Patient Spontanous Breathing and Patient connected to nasal cannula oxygen  Post-op Assessment: Report given to RN and Post -op Vital signs reviewed and stable  Post vital signs: Reviewed and stable  Last Vitals:  Vitals Value Taken Time  BP 91/53 12/31/2018  3:32 PM  Temp    Pulse 72 12/31/2018  3:35 PM  Resp 22 12/31/2018  3:36 PM  SpO2 92 % 12/31/2018  3:35 PM  Vitals shown include unvalidated device data.  Last Pain:  Vitals:   12/31/18 0557  TempSrc:   PainSc: 0-No pain      Patients Stated Pain Goal: 0 (37/48/27 0786)  Complications: No apparent anesthesia complications

## 2018-12-31 NOTE — Anesthesia Postprocedure Evaluation (Signed)
Anesthesia Post Note  Patient: Felicia Shelton  Procedure(s) Performed: RIGHT - SIDED LUMBAR 2-3,LUMBAR 3-4, LUMBAR 4-5 TRANSFORAMINAL LUMBAR INTERBODY FUSION WITH INSTRUMENTATION AND ALLOGRAFT (Right Back)     Patient location during evaluation: PACU Anesthesia Type: General Level of consciousness: sedated Pain management: pain level controlled Vital Signs Assessment: post-procedure vital signs reviewed and stable Respiratory status: spontaneous breathing and respiratory function stable Cardiovascular status: stable Postop Assessment: no apparent nausea or vomiting Anesthetic complications: no    Last Vitals:  Vitals:   12/31/18 1732 12/31/18 1747  BP: 123/60 102/61  Pulse: 70 69  Resp: 10 15  Temp: 36.4 C   SpO2: 98% 90%    Last Pain:  Vitals:   12/31/18 1732  TempSrc:   PainSc: 4                  Kathi Dohn DANIEL

## 2019-01-01 ENCOUNTER — Encounter (HOSPITAL_COMMUNITY): Payer: Self-pay | Admitting: Orthopedic Surgery

## 2019-01-01 LAB — GLUCOSE, CAPILLARY
GLUCOSE-CAPILLARY: 156 mg/dL — AB (ref 70–99)
Glucose-Capillary: 110 mg/dL — ABNORMAL HIGH (ref 70–99)
Glucose-Capillary: 103 mg/dL — ABNORMAL HIGH (ref 70–99)
Glucose-Capillary: 142 mg/dL — ABNORMAL HIGH (ref 70–99)

## 2019-01-01 MED ORDER — INSULIN ASPART 100 UNIT/ML ~~LOC~~ SOLN: 0.0000 [IU] | Freq: Every day | SUBCUTANEOUS | Status: DC

## 2019-01-01 MED ORDER — INSULIN ASPART 100 UNIT/ML ~~LOC~~ SOLN
0.0000 [IU] | Freq: Three times a day (TID) | SUBCUTANEOUS | Status: DC
  Administered 2019-01-02 – 2019-01-04 (×2): 2 [IU] via SUBCUTANEOUS

## 2019-01-01 NOTE — Addendum Note (Signed)
Addendum  created 01/01/19 0900 by Eulas Post, Alizea Pell W, CRNA   Charge Capture section accepted

## 2019-01-01 NOTE — Progress Notes (Signed)
Patient transferred to 3W08 from [facility]. Safety precautions and orders reviewed. VSS. Will continue to monitor.  Ave Filter, RN

## 2019-01-01 NOTE — Progress Notes (Signed)
Occupational Therapy Evaluation Patient Details Name: Felicia Shelton MRN: [id number] DOB: 27-Dec-1940 Today's Date: 01/01/2019    History of Present Illness Pt is a 78 y.o. female, s/p L2-L5 decompression and fusion surgery. Significant PMH of R leg pain from lumbar stenosis, DM, HTN, hyperlipidemia, neuromuscular disorder.   Clinical Impression   PTA, pt lived at home with her husband and was independent with ADL and mobility using SPC as needed. Pt able to ambulate @ 120 ft @ RW level with minguard A and VC for proper positioning in RW. Mod A with LB ADL. Daughter encouraging rehab at SNF due to pt living in split level home. Pt will need to be able to safely negotiate stairs in order to DC home. Will follow acutely to further assess appropriate DC venue.     Follow Up Recommendations  Home health OT;Supervision/Assistance - 24 hour vsSNF(pending progress; [place] [facility] if plan is home)    Equipment Recommendations  3 in 1 bedside commode;Tub/shower bench;Other (comment)(RW)    Recommendations for Other Services       Precautions / Restrictions Precautions Precautions: Back Precaution Booklet Issued: Yes (comment) Precaution Comments: education provided for log roll, pt's family able to repeat precautions, but pt with some difficulty remembering Required Braces or Orthoses: Spinal Brace Spinal Brace: Thoracolumbosacral orthotic;Applied in sitting position Restrictions Weight Bearing Restrictions: No      Mobility Bed Mobility Overal bed mobility: Needs Assistance Bed Mobility: Rolling;Sidelying to Sit Rolling: Supervision Sidelying to sit: Min guard     Transfers Overall transfer level: Needs assistance Equipment used: Rolling walker (2 wheeled) Transfers: Sit to/from Stand Sit to Stand: Min assist         General transfer comment: Pt required minA for boost from sitting.    Balance Overall balance assessment: Needs assistance Sitting-balance support: Bilateral upper  extremity supported;Feet supported Sitting balance-Leahy Scale: Good   Postural control: Posterior lean Standing balance support: Bilateral upper extremity supported Standing balance-Leahy Scale: Fair                            ADL either performed or assessed with clinical judgement   ADL Overall ADL's : Needs assistance/impaired     Grooming: Set up;Supervision/safety;Sitting   Upper Body Bathing: Set up;Supervision/ safety;Sitting   Lower Body Bathing: Moderate assistance;Sit to/from stand   Upper Body Dressing : Minimal assistance;Sitting Upper Body Dressing Details (indicate cue type and reason): for brace Lower Body Dressing: Moderate assistance;Sit to/from stand   Toilet Transfer: Minimal assistance;Ambulation;RW;BSC   Toileting- Clothing Manipulation and Hygiene: Moderate assistance;Sit to/from stand Toileting - Clothing Manipulation Details (indicate cue type and reason): Unsure if pt can reach pericarea to clean     Functional mobility during ADLs: Minimal assistance;Rolling walker;Cueing for safety;Cueing for sequencing General ADL Comments: Unable to complete figure four positioning. Began education on back precautions; will benefit from using AE for LB ADL. Began education on donning/doffing TLSO.      Vision Baseline Vision/History: Wears glasses Wears Glasses: Reading only       Perception     Praxis      Pertinent Vitals/Pain Pain Assessment: 0-10 Pain Score: 2  Pain Descriptors / Indicators: Aching;Discomfort;Grimacing Pain Intervention(s): Limited activity within patient's tolerance     Hand Dominance Right   Extremity/Trunk Assessment Upper Extremity Assessment Upper Extremity Assessment: Overall WFL for tasks assessed   Lower Extremity Assessment Lower Extremity Assessment: Defer to PT evaluation   Cervical / Trunk Assessment  Cervical / Trunk Assessment: Other exceptions(s/p fusion)   Communication  Communication Communication: No difficulties   Cognition Arousal/Alertness: Lethargic;Suspect due to medications Behavior During Therapy: [facility] for tasks assessed/performed Overall Cognitive Status: Impaired/Different from baseline Area of Impairment: Safety/judgement;Memory;Attention                   Current Attention Level: Selective Memory: Decreased recall of precautions;Decreased short-term memory Following Commands: Follows one step commands with increased time;Follows multi-step commands inconsistently Safety/Judgement: Decreased awareness of safety     General Comments: Pt somewhat sleepy throughout session, able to respond to commands when repeated. Unable to repeat precautions   General Comments       Exercises     Shoulder Instructions      Home Living Family/patient expects to be discharged to:: Private residence Living Arrangements: Spouse/significant other Available Help at Discharge: Family;Available 24 hours/day Type of Home: House Home Access: Stairs to enter [employer] of Steps: 6 Entrance Stairs-Rails: None Home Layout: Multi-level Alternate Level Stairs-Number of Steps: 7 Alternate Level Stairs-Rails: Can reach both Bathroom Shower/Tub: Teacher, early years/pre: Handicapped height(has both) Bathroom Accessibility: (pt unsure but "thinks so")   Home Equipment: Grab bars - toilet;Cane - single point          Prior Functioning/Environment Level of Independence: Independent                 OT Problem List: Decreased range of motion;Decreased activity tolerance;Impaired balance (sitting and/or standing);Decreased safety awareness;Decreased knowledge of use of DME or AE;Decreased knowledge of precautions;Pain      OT Treatment/Interventions: Self-care/ADL training;DME and/or AE instruction;Therapeutic activities;Patient/family education    OT Goals(Current goals can be found in the care plan section) Acute Rehab OT  Goals Patient Stated Goal: to get better OT Goal Formulation: With patient Time For Goal Achievement: 01/15/19 Potential to Achieve Goals: Good  OT Frequency: Min 3X/week   Barriers to D/C:            Co-evaluation              AM-PAC OT "6 Clicks" Daily Activity     Outcome Measure Help from another person eating meals?: None Help from another person taking care of personal grooming?: A Little Help from another person toileting, which includes using toliet, bedpan, or urinal?: A Little Help from another person bathing (including washing, rinsing, drying)?: A Lot Help from another person to put on and taking off regular upper body clothing?: A Little Help from another person to put on and taking off regular lower body clothing?: A Lot 6 Click Score: 17   End of Session Equipment Utilized During Treatment: Gait belt;Rolling walker;Back brace Nurse Communication: Mobility status  Activity Tolerance: Patient tolerated treatment well Patient left: in chair;with call bell/phone within reach;with family/visitor present  OT Visit Diagnosis: Unsteadiness on feet (R26.81);Other abnormalities of gait and mobility (R26.89);Pain Pain - part of body: (back)                Time: 0240-9735 OT Time Calculation (min): 36 min Charges:  OT General Charges $OT Visit: 1 Visit OT Evaluation $OT Eval Moderate Complexity: 1 Mod OT Treatments $Self Care/Home Management : 8-22 mins  Maurie Boettcher, OT/L   Acute OT Clinical Specialist Acute Rehabilitation Services Pager [phone] Office [phone]   [facility] 01/01/2019, 1:20 PM

## 2019-01-01 NOTE — Progress Notes (Signed)
RN reviewed POC with pt and offer pain medication. Pt declined medication at this time stating that she is resting now.   Ave Filter, RN

## 2019-01-01 NOTE — Evaluation (Addendum)
Physical Therapy Evaluation Patient Details Name: Felicia Shelton MRN: [id number] DOB: 21-Aug-1941 Today's Date: 01/01/2019   History of Present Illness  Pt is a 78 y.o. female, s/p L2-L5 decompression and fusion surgery. Significant PMH of R leg pain from lumbar stenosis, DM, HTN, hyperlipidemia, neuromuscular disorder.   Clinical Impression   Pt received in bed, willing to participate in therapy. Husband and daughter in the room. Pt reporting no pain, but feeling fuzzy from pain medicine. Education provided on back precautions, TLSO brace management, log roll for safely getting out of bed. Pt able to don brace with modA. Family verbalized understand of precautions, but pt needs reinforcement. Pt able to roll in bed with min guard to minA. Sidelying to sit with min guard, sit to sidelying with minA for management of LEs. Repeated v/c throughout bed mobility to ensure precautions were followed. Sit to stand with bed elevated, minA for boost up without bending trunk. Unable to assess ambulation at this time, as pt became very dizzy upon standing with a strong posterior lean, requiring maxA to maintain upright. Noted repeated need for cues and physical assistance throughout session to maintain back precautions. At one point, pt turned to talk to husband behind her without realizing she was twisting her back- education provided again about precautions. At this time, based on functional mobility observed, recommending SNF for DC to ensure that pt is able to increase strength and improve mobility safely while maintaining back precautions.     Follow Up Recommendations SNF    Equipment Recommendations  Other (comment)(defer to next venue)    Recommendations for Other Services       Precautions / Restrictions Precautions Precautions: Back Precaution Booklet Issued: No Precaution Comments: education provided for log roll, pt's family able to repeat precautions, but pt with some difficulty  remembering Required Braces or Orthoses: Spinal Brace Spinal Brace: Thoracolumbosacral orthotic;Applied in sitting position Restrictions Weight Bearing Restrictions: No      Mobility  Bed Mobility Overal bed mobility: Needs Assistance Bed Mobility: Rolling;Sidelying to Sit;Sit to Sidelying Rolling: Min assist;Min guard Sidelying to sit: Min guard     Sit to sidelying: Min assist General bed mobility comments: Pt able to roll in bed with v/c for log roll, hand held assist to assist with roll momentum, varying from needing minA to min guard with repeated rolling. Sidelying to sit with min guard, no physical assist needed, v/c for use of UEs. Sit to sidelying with minA for management of LEs.  Transfers Overall transfer level: Needs assistance Equipment used: Rolling walker (2 wheeled) Transfers: Sit to/from Stand Sit to Stand: Min assist         General transfer comment: Pt required minA for boost from sitting. On first attempt, too much bending and required v/c and demonstration to stand up keeping back straight.  Ambulation/Gait Ambulation/Gait assistance: (unable to assess due to high levels of dizziness upon standing and strong backwards lean. suspect due to medications)              Stairs            Wheelchair Mobility    Modified Rankin (Stroke Patients Only)       Balance Overall balance assessment: Needs assistance Sitting-balance support: Bilateral upper extremity supported;Feet supported Sitting balance-Leahy Scale: Fair   Postural control: Posterior lean Standing balance support: Bilateral upper extremity supported Standing balance-Leahy Scale: Zero Standing balance comment: Pt with strong posterior lean upon standing. Required maxA to maintain upright.  Pertinent Vitals/Pain Pain Assessment: No/denies pain    Home Living Family/patient expects to be discharged to:: Private residence Living  Arrangements: Spouse/significant other Available Help at Discharge: Family;Available 24 hours/day Type of Home: House Home Access: Stairs to enter Entrance Stairs-Rails: None Entrance Stairs-Number of Steps: 6 Home Layout: Multi-level Home Equipment: Grab bars - toilet      Prior Function Level of Independence: Independent               Hand Dominance        Extremity/Trunk Assessment   Upper Extremity Assessment Upper Extremity Assessment: Defer to OT evaluation    Lower Extremity Assessment Lower Extremity Assessment: Overall WFL for tasks assessed       Communication   Communication: No difficulties  Cognition Arousal/Alertness: Lethargic;Suspect due to medications Behavior During Therapy: [facility] for tasks assessed/performed Overall Cognitive Status: Impaired/Different from baseline Area of Impairment: Safety/judgement;Memory;Following commands                     Memory: Decreased recall of precautions Following Commands: Follows one step commands with increased time;Follows multi-step commands inconsistently Safety/Judgement: Decreased awareness of safety     General Comments: Pt somewhat sleepy throughout session, able to respond to commands when repeated. Unable to repeat precautions      General Comments      Exercises     Assessment/Plan    PT Assessment Patient needs continued PT services  PT Problem List Decreased strength;Decreased balance;Decreased knowledge of precautions;Pain;Decreased mobility;Decreased knowledge of use of DME;Decreased activity tolerance;Decreased safety awareness       PT Treatment Interventions DME instruction;Functional mobility training;Balance training;Patient/family education;Gait training;Therapeutic activities;Neuromuscular re-education;Stair training;Therapeutic exercise;Cognitive remediation    PT Goals (Current goals can be found in the Care Plan section)  Acute Rehab PT Goals Patient Stated Goal:  make sure she is safe moving around PT Goal Formulation: With patient/family Time For Goal Achievement: 01/15/19 Potential to Achieve Goals: Good    Frequency Min 5X/week   Barriers to discharge Inaccessible home environment Home has multiple levels, pt would have to climb many steps to enter the home, access bedroom.    Co-evaluation               AM-PAC PT "6 Clicks" Mobility  Outcome Measure Help needed turning from your back to your side while in a flat bed without using bedrails?: A Little Help needed moving from lying on your back to sitting on the side of a flat bed without using bedrails?: A Little Help needed moving to and from a bed to a chair (including a wheelchair)?: A Lot Help needed standing up from a chair using your arms (e.g., wheelchair or bedside chair)?: A Lot Help needed to walk in hospital room?: A Little Help needed climbing 3-5 steps with a railing? : A Lot 6 Click Score: 15    End of Session Equipment Utilized During Treatment: Back brace Activity Tolerance: Treatment limited secondary to medical complications (Comment)(dizziness, off balance) Patient left: in bed;with call bell/phone within reach;with family/visitor present Nurse Communication: Mobility status PT Visit Diagnosis: Unsteadiness on feet (R26.81);Other abnormalities of gait and mobility (R26.89);Pain Pain - part of body: (back)    Time:  -      Charges:              Ronnell Guadalajara, SPT   Ronnell Guadalajara 01/01/2019, 10:42 AM

## 2019-01-01 NOTE — Progress Notes (Signed)
Patient is transferred from room 3C06 to unit 3W08 at this time. Alert and in stable condition. Report given to receiving nurse Juliann Pulse, RN with all questions answered. Left unit via bed with daughter and all belongings at side.

## 2019-01-01 NOTE — Progress Notes (Signed)
Patient declined insulin at this time verbalized that she never takes insulin and do not want it. BG 156 at dinner time.   Ave Filter, RN

## 2019-01-01 NOTE — Progress Notes (Signed)
    Patient doing well  Patient denies leg pain Has been ambulating Patient does express some concerns about her current ability to care for herself at home   Physical Exam: Vitals:   12/31/18 2310 01/01/19 0357  BP: 126/62 (!) 121/59  Pulse: 71 82  Resp: 16 18  Temp: (!) 97.4 F (36.3 C) 97.9 F (36.6 C)  SpO2: 94% 92%    Dressing in place NVI  POD #1 s/p L2-5 decompression and fusion, doing well  - up with PT/OT, encourage ambulation - Percocet for pain, Valium for muscle spasms - we will see how therapy goes today. Patient may need another day or two in the hospital, and may also potentially need rehab for a short time. She is amenable to both. I look forward to further input from her therapy team.

## 2019-01-02 LAB — GLUCOSE, CAPILLARY
Glucose-Capillary: 134 mg/dL — ABNORMAL HIGH (ref 70–99)
Glucose-Capillary: 135 mg/dL — ABNORMAL HIGH (ref 70–99)
Glucose-Capillary: 110 mg/dL — ABNORMAL HIGH (ref 70–99)
Glucose-Capillary: 155 mg/dL — ABNORMAL HIGH (ref 70–99)
Glucose-Capillary: 117 mg/dL — ABNORMAL HIGH (ref 70–99)

## 2019-01-02 NOTE — Progress Notes (Signed)
Occupational Therapy Treatment Patient Details Name: Felicia Shelton MRN: [id number] DOB: 27-Sep-1941 Today's Date: 01/02/2019    History of present illness Pt is a 78 y.o. female, s/p L2-L5 decompression and fusion surgery. Significant PMH of R leg pain from lumbar stenosis, DM, HTN, hyperlipidemia, neuromuscular disorder.   OT comments  Pt progressing slowly towards established OT goals. Presenting with poor recall of back precautions and requiring Min A for all sit<>stand attempts due to poor weight shift forward. Providing education on use of AE for LB ADLs and pt requiring Mod A for management of AE. Pt would benefit from post-acute rehab to increase safety and independence with ADLs. Will continue to follow acutely as admitted to facilitate safe dc.    Follow Up Recommendations  SNF;Supervision/Assistance - 24 hour    Equipment Recommendations  3 in 1 bedside commode    Recommendations for Other Services      Precautions / Restrictions Precautions Precautions: Back Precaution Comments: Pt not recalling back precautions. Reviewed back precautions in fall and adherance during ADLs Required Braces or Orthoses: Spinal Brace Spinal Brace: Thoracolumbosacral orthotic;Applied in sitting position Restrictions Weight Bearing Restrictions: No       Mobility Bed Mobility Overal bed mobility: Needs Assistance Bed Mobility: Rolling;Sidelying to Sit              Transfers Overall transfer level: Needs assistance Equipment used: Rolling walker (2 wheeled) Transfers: Sit to/from [facility] Sit to Stand: Min assist         General transfer comment: Min A for power up and weight shift forward    Balance Overall balance assessment: Needs assistance Sitting-balance support: Feet supported Sitting balance-Leahy Scale: Fair     Standing balance support: Bilateral upper extremity supported;Single extremity supported Standing balance-Leahy Scale: Poor                              ADL either performed or assessed with clinical judgement   ADL Overall ADL's : Needs assistance/impaired     Grooming: Wash/dry hands;Min guard;Standing         Lower Body Bathing Details (indicate cue type and reason): Providing education on AE for LB bathing Upper Body Dressing : Moderate assistance;Sitting Upper Body Dressing Details (indicate cue type and reason): Mod A for cues and assistance to don brace. Pt presenting with poor recall of sequence for donning brace Lower Body Dressing: Moderate assistance;Sit to/from stand;Cueing for sequencing;Cueing for safety;With adaptive equipment Lower Body Dressing Details (indicate cue type and reason): Educating pt on use of AE for LB dressing. Pt able to don underwear and socks with Mod A for managment of AE. Will need further practice Toilet Transfer: Minimal assistance;Ambulation;Regular Toilet;Grab bars;RW Armed forces technical officer Details (indicate cue type and reason): Min A to power up and wieght shift forward Toileting- Clothing Manipulation and Hygiene: Min guard;Sitting/lateral lean       Functional mobility during ADLs: Minimal assistance;Rolling walker General ADL Comments: Pt demonstrating poor balance, strength, and recall of precautions,     Vision       Perception     Praxis      Cognition Arousal/Alertness: Awake/alert Behavior During Therapy: WFL for tasks assessed/performed Overall Cognitive Status: Impaired/Different from baseline Area of Impairment: Memory;Safety/judgement;Problem solving                     Memory: Decreased short-term memory;Decreased recall of precautions   Safety/Judgement: Decreased awareness of safety;Decreased  awareness of deficits   Problem Solving: Slow processing;Requires verbal cues;Difficulty sequencing General Comments: Pt requring increased cues during ADLs presenting with decreased problem solving. Pt requiring increased time throughout. Cues  for back precautions as pt not recalling them        Exercises     Shoulder Instructions       General Comments      Pertinent Vitals/ Pain       Pain Assessment: Faces Faces Pain Scale: Hurts little more Pain Location: back Pain Descriptors / Indicators: Aching;Discomfort Pain Intervention(s): Monitored during session;Limited activity within patient's tolerance;Repositioned  Home Living                                          Prior Functioning/Environment              Frequency  Min 3X/week        Progress Toward Goals  OT Goals(current goals can now be found in the care plan section)  Progress towards OT goals: Progressing toward goals  Acute Rehab OT Goals Patient Stated Goal: to get better OT Goal Formulation: With patient Time For Goal Achievement: 01/15/19 Potential to Achieve Goals: Good ADL Goals Pt Will Perform Lower Body Bathing: with supervision;with set-up;with adaptive equipment;sit to/from stand Pt Will Perform Lower Body Dressing: with set-up;with supervision;with adaptive equipment;sit to/from stand Pt Will Transfer to Toilet: with modified independence;bedside commode;ambulating Pt Will Perform Toileting - Clothing Manipulation and hygiene: with modified independence;sit to/from stand;with adaptive equipment Additional ADL Goal #1: Pt will independently verbalize 3/3 back precautions  Plan Discharge plan needs to be updated    Co-evaluation                 AM-PAC OT "6 Clicks" Daily Activity     Outcome Measure   Help from another person eating meals?: None Help from another person taking care of personal grooming?: A Little Help from another person toileting, which includes using toliet, bedpan, or urinal?: A Little Help from another person bathing (including washing, rinsing, drying)?: A Lot Help from another person to put on and taking off regular upper body clothing?: A Lot Help from another person to put  on and taking off regular lower body clothing?: A Lot 6 Click Score: 16    End of Session Equipment Utilized During Treatment: Rolling walker;Back brace  OT Visit Diagnosis: Unsteadiness on feet (R26.81);Other abnormalities of gait and mobility (R26.89);Pain Pain - part of body: (Back)   Activity Tolerance Patient tolerated treatment well   Patient Left in chair;with call bell/phone within reach   Nurse Communication Mobility status        Time: 1610-9604 OT Time Calculation (min): 34 min  Charges: OT General Charges $OT Visit: 1 Visit OT Treatments $Self Care/Home Management : 23-37 mins  [place], OTR/L Acute Rehab Pager: [phone] Office: [place] 01/02/2019, 4:35 PM

## 2019-01-02 NOTE — Progress Notes (Signed)
Physical Therapy Treatment Patient Details Name: Felicia Shelton MRN: [id number] DOB: Mar 28, 1941 Today's Date: 01/02/2019    History of Present Illness Pt is a 78 y.o. female, s/p L2-L5 decompression and fusion surgery. Significant PMH of R leg pain from lumbar stenosis, DM, HTN, hyperlipidemia, neuromuscular disorder.    PT Comments    Pt making slow progress with functional mobility. She remains limited secondary to pain and weakness. Pt feels her husband would be unable to assist her if she were to d/c home; therefore, feel that pt would benefit from short term rehab at a SNF prior to returning home with family. Pt would continue to benefit from skilled physical therapy services at this time while admitted and after d/c to address the below listed limitations in order to improve overall safety and independence with functional mobility.    Follow Up Recommendations  SNF     Equipment Recommendations  Rolling walker with 5" wheels    Recommendations for Other Services       Precautions / Restrictions Precautions Precautions: Back Precaution Comments: reviewed back precautions with pt in regards to functional mobility  Required Braces or Orthoses: Spinal Brace Spinal Brace: Thoracolumbosacral orthotic;Applied in sitting position Restrictions Weight Bearing Restrictions: No    Mobility  Bed Mobility               General bed mobility comments: pt OOB in recliner chair upon arrival  Transfers Overall transfer level: Needs assistance Equipment used: Rolling walker (2 wheeled) Transfers: Sit to/from [facility] Sit to Stand: Min assist Stand pivot transfers: Min assist       General transfer comment: increased time and effort, cueing for safe hand placement, assist to power into standing from recliner chair x1 and from [facility] x1  Ambulation/Gait Ambulation/Gait assistance: Min guard Gait Distance (Feet): 100 Feet Assistive device: Rolling walker (2  wheeled) Gait Pattern/deviations: Step-through pattern;Decreased step length - right;Decreased step length - left;Decreased stride length Gait velocity: decreased   General Gait Details: pt with very slow, cautious and guarded gait with use of RW, cueing needed to maintain body within frame of walker; min guard overall with no overt LOB   Stairs             Wheelchair Mobility    Modified Rankin (Stroke Patients Only)       Balance Overall balance assessment: Needs assistance Sitting-balance support: Feet supported Sitting balance-Leahy Scale: Fair     Standing balance support: Bilateral upper extremity supported;Single extremity supported Standing balance-Leahy Scale: Poor                              Cognition Arousal/Alertness: Awake/alert Behavior During Therapy: WFL for tasks assessed/performed Overall Cognitive Status: Impaired/Different from baseline Area of Impairment: Memory;Safety/judgement                     Memory: Decreased short-term memory;Decreased recall of precautions   Safety/Judgement: Decreased awareness of safety;Decreased awareness of deficits            Exercises      General Comments        Pertinent Vitals/Pain Pain Assessment: Faces Faces Pain Scale: Hurts little more Pain Location: back Pain Descriptors / Indicators: Aching;Discomfort Pain Intervention(s): Monitored during session;Repositioned    Home Living                      Prior Function  PT Goals (current goals can now be found in the care plan section) Acute Rehab PT Goals PT Goal Formulation: With patient/family Time For Goal Achievement: 01/15/19 Potential to Achieve Goals: Good Progress towards PT goals: Progressing toward goals    Frequency    Min 5X/week      PT Plan Current plan remains appropriate    Co-evaluation              AM-PAC PT "6 Clicks" Mobility   Outcome Measure  Help needed  turning from your back to your side while in a flat bed without using bedrails?: A Little Help needed moving from lying on your back to sitting on the side of a flat bed without using bedrails?: A Little Help needed moving to and from a bed to a chair (including a wheelchair)?: A Little Help needed standing up from a chair using your arms (e.g., wheelchair or bedside chair)?: A Little Help needed to walk in hospital room?: A Little Help needed climbing 3-5 steps with a railing? : A Lot 6 Click Score: 17    End of Session Equipment Utilized During Treatment: Back brace;Gait belt Activity Tolerance: Patient tolerated treatment well Patient left: in chair;with call bell/phone within reach;with family/visitor present Nurse Communication: Mobility status PT Visit Diagnosis: Unsteadiness on feet (R26.81);Other abnormalities of gait and mobility (R26.89);Pain Pain - part of body: (back)     Time: 4034-7425 PT Time Calculation (min) (ACUTE ONLY): 17 min  Charges:  $Therapeutic Activity: 8-22 mins                     Sherie Don, PT, DPT  Acute Rehabilitation Services Pager [phone] Office [place] 01/02/2019, 10:09 AM

## 2019-01-02 NOTE — Progress Notes (Signed)
Subjective: 2 Days Post-Op Procedure(s) (LRB): RIGHT - SIDED LUMBAR 2-3,LUMBAR 3-4, LUMBAR 4-5 TRANSFORAMINAL LUMBAR INTERBODY FUSION WITH INSTRUMENTATION AND ALLOGRAFT (Right) Patient reports pain as moderate.  Taking by mouth and voiding okay.  Slow progress with physical therapy.  Physical therapy has recommended skilled nursing facility.  Denies leg pain.  Husband is up in years and unable to physically manage her.  Objective: Vital signs in last 24 hours: Temp:  [97.6 F (36.4 C)-99.7 F (37.6 C)] 97.9 F (36.6 C) (02/15 0733) Pulse Rate:  [81-111] 98 (02/15 0733) Resp:  [16-20] 16 (02/15 0733) BP: (94-147)/(50-84) 94/74 (02/15 0733) SpO2:  [96 %-99 %] 97 % (02/15 0733)  Intake/Output from previous day: 02/14 0701 - 02/15 0700 In: 300 [P.O.:300] Out: -  Intake/Output this shift: No intake/output data recorded.  No results for input(s): HGB in the last 72 hours. No results for input(s): WBC, RBC, HCT, PLT in the last 72 hours. No results for input(s): NA, K, CL, CO2, BUN, CREATININE, GLUCOSE, CALCIUM in the last 72 hours. No results for input(s): LABPT, INR in the last 72 hours. Lumbar spine exam: Lumbar dressing benign.  Lumbosacral orthosis is in good position.  N/V status is intact to both lower extremities without weakness.   Assessment/Plan: 2 Days Post-Op Procedure(s) (LRB): RIGHT - SIDED LUMBAR 2-3,LUMBAR 3-4, LUMBAR 4-5 TRANSFORAMINAL LUMBAR INTERBODY FUSION WITH INSTRUMENTATION AND ALLOGRAFT (Right) Plan: Up with physical therapy and Occupational Therapy. Based upon her lack of progress would benefit from short skilled nursing facility stay. Will have social worker address situation. Continue oral meds as needed. Back brace on when out of bed.      Felicia Shelton 01/02/2019, 8:41 AM

## 2019-01-03 LAB — GLUCOSE, CAPILLARY
GLUCOSE-CAPILLARY: 104 mg/dL — AB (ref 70–99)
Glucose-Capillary: 105 mg/dL — ABNORMAL HIGH (ref 70–99)
Glucose-Capillary: 126 mg/dL — ABNORMAL HIGH (ref 70–99)
Glucose-Capillary: 115 mg/dL — ABNORMAL HIGH (ref 70–99)

## 2019-01-03 NOTE — Progress Notes (Signed)
Subjective: 3 Days Post-Op Procedure(s) (LRB): RIGHT - SIDED LUMBAR 2-3,LUMBAR 3-4, LUMBAR 4-5 TRANSFORAMINAL LUMBAR INTERBODY FUSION WITH INSTRUMENTATION AND ALLOGRAFT (Right) Patient reports pain as moderate.  Patient making slow progress with physical therapy/OT.  Both PT and OT have recommended skilled nursing facility.  She denies significant leg pain.  She does have back pain.  She has been wearing her back brace when up.  Objective: Vital signs in last 24 hours: Temp:  [98 F (36.7 C)-99.8 F (37.7 C)] 98 F (36.7 C) (02/16 0738) Pulse Rate:  [83-112] 91 (02/16 0738) Resp:  [16-20] 18 (02/16 0738) BP: (117-138)/(43-68) 121/43 (02/16 0738) SpO2:  [87 %-99 %] 92 % (02/16 0738)  Intake/Output from previous day: 02/15 0701 - 02/16 0700 In: 960 [P.O.:960] Out: -  Intake/Output this shift: Total I/O In: 320 [P.O.:320] Out: -   No results for input(s): HGB in the last 72 hours. No results for input(s): WBC, RBC, HCT, PLT in the last 72 hours. No results for input(s): NA, K, CL, CO2, BUN, CREATININE, GLUCOSE, CALCIUM in the last 72 hours. No results for input(s): LABPT, INR in the last 72 hours. Lumbar spine exam: Lumbosacral orthosis is fitting well. Lumbar dressing clean and dry.  Bilateral calves are soft and nontender.  No motor weakness of either lower extremity.   Assessment/Plan: 3 Days Post-Op Procedure(s) (LRB): RIGHT - SIDED LUMBAR 2-3,LUMBAR 3-4, LUMBAR 4-5 TRANSFORAMINAL LUMBAR INTERBODY FUSION WITH INSTRUMENTATION AND ALLOGRAFT (Right) Plan: Continue PT/OT. Will need skilled nursing facility as she remains limited with functional mobility and slow progress has been made with therapies.  PT/OT have both recommended short-term rehab at a skilled nursing facility. Hopefully these arrangements can be made Monday.  A social worker consult was placed yesterday.     Erlene Senters 01/03/2019, 11:01 AM

## 2019-01-03 NOTE — NC FL2 (Signed)
[place] MEDICAID FL2 LEVEL OF CARE SCREENING TOOL     IDENTIFICATION  Patient Name: Felicia Shelton Birthdate: [birth year] Sex: female Admission Date (Current Location): 12/31/2018  [facility] and [place] Number:  Herbalist and Address:  [facility], [place] [address], [place], [place] [address]      Provider Number: [id number]  Attending Physician Name and Address:  Phylliss Bob, MD  Relative Name and Phone Number:  [place], [place], [place], Daughter, [phone]    Current Level of Care: Hospital Recommended Level of Care: [place] Prior Approval Number:    Date Approved/Denied: 01/03/19 PASRR Number: [id number]  Discharge Plan: SNF    Current Diagnoses: Patient Active Problem List   Diagnosis Date Noted  . Radiculopathy 12/31/2018  . Hypertension 08/04/2012  . Hx of adenomatous colonic polyp 05/26/2008    Orientation RESPIRATION BLADDER Height & Weight     Self, Time, Situation, Place  Normal Continent Weight: 167 lb 3.2 oz (75.8 kg) Height:  5\' 6"  (167.6 cm)  BEHAVIORAL SYMPTOMS/MOOD NEUROLOGICAL BOWEL NUTRITION STATUS      Continent Diet(Heart healthy/carb mod, thin liquids)  AMBULATORY STATUS COMMUNICATION OF NEEDS Skin   Supervision Verbally Normal, Surgical wounds(On back and is treated by gauze)                       Personal Care Assistance Level of Assistance  Bathing, Feeding, Dressing, Total care Bathing Assistance: Limited assistance Feeding assistance: Independent Dressing Assistance: Limited assistance Total Care Assistance: Limited assistance   Functional Limitations Info  Sight, Hearing, Speech Sight Info: Adequate Hearing Info: Adequate Speech Info: Adequate    SPECIAL CARE FACTORS FREQUENCY  PT (By licensed PT), OT (By licensed OT)     PT Frequency: 5x/wk OT Frequency: 5x/wk            Contractures Contractures Info: Not present    Additional  Factors Info  Code Status, Allergies, Insulin Sliding Scale Code Status Info: Full Code Allergies Info: N/A   Insulin Sliding Scale Info: insulin aspart novolog 0-15 units 3 x daily w/meals & insulin aspart novolog 0-5 units daily at bedtime       Current Medications (01/03/2019):  This is the current hospital active medication list Current Facility-Administered Medications  Medication Dose Route Frequency Provider Last Rate Last Dose  . 0.9 % NaCl with KCl 20 mEq/ L  infusion   Intravenous Continuous McKenzie, Lennie Muckle, PA-C   Stopped at 01/01/19 6767  . acetaminophen (TYLENOL) tablet 650 mg  650 mg Oral Q4H PRN Justice Britain, PA-C   650 mg at 01/02/19 0033   Or  . acetaminophen (TYLENOL) suppository 650 mg  650 mg Rectal Q4H PRN McKenzie, Lennie Muckle, PA-C      . alum & mag hydroxide-simeth (MAALOX/MYLANTA) 200-200-20 MG/5ML suspension 30 mL  30 mL Oral Q6H PRN McKenzie, Kayla J, PA-C      . amLODipine (NORVASC) tablet 5 mg  5 mg Oral Daily McKenzie, Kayla J, PA-C   5 mg at 01/03/19 0932  . atorvastatin (LIPITOR) tablet 40 mg  40 mg Oral q1800 McKenzieLennie Muckle, PA-C   40 mg at 01/02/19 1840  . bisacodyl (DULCOLAX) EC tablet 5 mg  5 mg Oral Daily PRN McKenzie, Kayla J, PA-C      . cholecalciferol (VITAMIN D) tablet 5,000 Units  5,000 Units Oral Q M,W,F McKenzie, Lennie Muckle, PA-C   5,000 Units at 01/01/19  1014  . diazepam (VALIUM) tablet 5 mg  5 mg Oral Q6H PRN McKenzie, Lennie Muckle, PA-C   5 mg at 01/02/19 0149  . docusate sodium (COLACE) capsule 100 mg  100 mg Oral BID McKenzieLennie Muckle, PA-C   100 mg at 01/03/19 0931  . gabapentin (NEURONTIN) capsule 600 mg  600 mg Oral QPM McKenzie, Kayla J, PA-C   600 mg at 01/02/19 1840  . insulin aspart (novoLOG) injection 0-15 Units  0-15 Units Subcutaneous TID WC Phylliss Bob, MD   2 Units at 01/02/19 0539  . insulin aspart (novoLOG) injection 0-5 Units  0-5 Units Subcutaneous QHS Phylliss Bob, MD      . linagliptin (TRADJENTA) tablet 5 mg  5 mg Oral  Daily McKenzie, Kayla J, PA-C   5 mg at 01/03/19 0932  . lisinopril (PRINIVIL,ZESTRIL) tablet 20 mg  20 mg Oral Daily McKenzie, Lennie Muckle, PA-C   20 mg at 01/03/19 0932  . loratadine (CLARITIN) tablet 10 mg  10 mg Oral Daily McKenzie, Lennie Muckle, PA-C   10 mg at 01/03/19 0932  . menthol-cetylpyridinium (CEPACOL) lozenge 3 mg  1 lozenge Oral PRN McKenzie, Kayla J, PA-C       Or  . phenol (CHLORASEPTIC) mouth spray 1 spray  1 spray Mouth/Throat PRN McKenzie, Lennie Muckle, PA-C   1 spray at 01/03/19 0933  . metoprolol tartrate (LOPRESSOR) tablet 50 mg  50 mg Oral QPM McKenzie, Kayla J, PA-C   50 mg at 01/02/19 1839  . morphine 2 MG/ML injection 1-2 mg  1-2 mg Intravenous Q2H PRN McKenzie, Kayla J, PA-C      . ondansetron (ZOFRAN) tablet 4 mg  4 mg Oral Q6H PRN McKenzie, Kayla J, PA-C       Or  . ondansetron (ZOFRAN) injection 4 mg  4 mg Intravenous Q6H PRN McKenzie, Kayla J, PA-C      . oxyCODONE-acetaminophen (PERCOCET/ROXICET) 5-325 MG per tablet 1-2 tablet  1-2 tablet Oral Q4H PRN McKenzie, Lennie Muckle, PA-C   1 tablet at 01/03/19 [id number]  . pantoprazole (PROTONIX) EC tablet 40 mg  40 mg Oral QPM McKenzie, Kayla J, PA-C   40 mg at 01/02/19 1840  . senna-docusate (Senokot-S) tablet 1 tablet  1 tablet Oral QHS PRN McKenzieLennie Muckle, PA-C   1 tablet at 01/03/19 0932  . sodium phosphate (FLEET) 7-19 GM/118ML enema 1 enema  1 enema Rectal Once PRN McKenzie, Kayla J, PA-C      . zolpidem (AMBIEN) tablet 5 mg  5 mg Oral QHS PRN Justice Britain, PA-C         Discharge Medications: Please see discharge summary for a list of discharge medications.  Relevant Imaging Results:  Relevant Lab Results:   Additional Information SSN: [id number]  Philippa Chester Yosselyn Tax, LCSWA

## 2019-01-03 NOTE — Progress Notes (Signed)
Physical Therapy Treatment Patient Details Name: Felicia Shelton MRN: [id number] DOB: 08-11-1941 Today's Date: 01/03/2019    History of Present Illness Pt is a 78 y.o. female, s/p L2-L5 decompression and fusion surgery. Significant PMH of R leg pain from lumbar stenosis, DM, HTN, hyperlipidemia, neuromuscular disorder.    PT Comments    Patient with episode of incontinence of urine on her way to the bathroom. Required assist to clean her lower legs (to prevent bending at back) and to don briefs with a pad prior to walking in the hall. She continues to need assist with bed mobility and vc for upright posture and proper use of RW. She can continue to benefit from PT prior to return home with elderly husband.     Follow Up Recommendations  SNF     Equipment Recommendations  Rolling walker with 5" wheels    Recommendations for Other Services       Precautions / Restrictions Precautions Precautions: Back Precaution Comments: pt able to recall/describe all back precautions (sometimes not using same terminology, but understood concepts) Required Braces or Orthoses: Spinal Brace Spinal Brace: Thoracolumbosacral orthotic;Applied in sitting position Restrictions Weight Bearing Restrictions: No    Mobility  Bed Mobility Overal bed mobility: Needs Assistance Bed Mobility: Sidelying to Sit;Sit to Sidelying   Sidelying to sit: Min assist(with rail)     Sit to sidelying: Min guard(with rail) General bed mobility comments: on left side on arrival and returned to left side  Transfers Overall transfer level: Needs assistance Equipment used: Rolling walker (2 wheeled) Transfers: Sit to/from Stand Sit to Stand: Min assist         General transfer comment: increased time and effort, cueing for safe hand placement, assist to power into standing from bed x1 and from toilet with BSC x1  Ambulation/Gait Ambulation/Gait assistance: Min guard Gait Distance (Feet): 100 Feet Assistive device:  Rolling walker (2 wheeled) Gait Pattern/deviations: Step-through pattern;Decreased step length - right;Decreased step length - left;Decreased stride length Gait velocity: decreased   General Gait Details: pt with very slow, cautious and guarded gait with use of RW, cueing needed to maintain body within frame of walker; min guard overall with no overt LOB   Stairs             Wheelchair Mobility    Modified Rankin (Stroke Patients Only)       Balance Overall balance assessment: Needs assistance Sitting-balance support: Feet supported Sitting balance-Leahy Scale: Fair     Standing balance support: Bilateral upper extremity supported;Single extremity supported Standing balance-Leahy Scale: Poor                              Cognition Arousal/Alertness: Awake/alert Behavior During Therapy: WFL for tasks assessed/performed                                          Exercises      General Comments        Pertinent Vitals/Pain Pain Assessment: Faces Faces Pain Scale: Hurts little more Pain Location: back Pain Descriptors / Indicators: Aching;Discomfort;Operative site guarding Pain Intervention(s): Limited activity within patient's tolerance;Monitored during session;Repositioned(encouraged to request pain meds)    Home Living                      Prior Function  PT Goals (current goals can now be found in the care plan section) Acute Rehab PT Goals Time For Goal Achievement: 01/15/19 Progress towards PT goals: Progressing toward goals    Frequency    Min 5X/week      PT Plan Current plan remains appropriate    Co-evaluation              AM-PAC PT "6 Clicks" Mobility   Outcome Measure  Help needed turning from your back to your side while in a flat bed without using bedrails?: A Little Help needed moving from lying on your back to sitting on the side of a flat bed without using bedrails?: A  Little Help needed moving to and from a bed to a chair (including a wheelchair)?: A Little Help needed standing up from a chair using your arms (e.g., wheelchair or bedside chair)?: A Little Help needed to walk in hospital room?: A Little Help needed climbing 3-5 steps with a railing? : A Lot 6 Click Score: 17    End of Session Equipment Utilized During Treatment: Back brace;Gait belt Activity Tolerance: Patient tolerated treatment well Patient left: with call bell/phone within reach;in bed;with bed alarm set Nurse Communication: Mobility status;Other (comment)(incontinent prior to getting to BR; put on briefs and pad) PT Visit Diagnosis: Unsteadiness on feet (R26.81);Other abnormalities of gait and mobility (R26.89);Pain Pain - part of body: (back)     Time: 5366-4403 PT Time Calculation (min) (ACUTE ONLY): 33 min  Charges:  $Gait Training: 8-22 mins $Therapeutic Activity: 8-22 mins                        [employer], PT 01/03/2019, 2:05 PM

## 2019-01-03 NOTE — Clinical Social Work Note (Signed)
Clinical Social Work Assessment  Patient Details  Name: Felicia Shelton MRN: [id number] Date of Birth: 1941/11/16  Date of referral:  01/03/19               Reason for consult:  Discharge Planning                Permission sought to share information with:  Case Manager Permission granted to share information::  Yes, Verbal Permission Granted  Name::     [name]  Agency::  [facility] SNF  Relationship::  Husband  Contact Information:     Housing/Transportation Living arrangements for the past 2 months:  Single Family Home Source of Information:  Patient Patient Interpreter Needed:  None Criminal Activity/Legal Involvement Pertinent to Current Situation/Hospitalization:  No - Comment as needed Significant Relationships:  Spouse, Adult Children Lives with:  Spouse Do you feel safe going back to the place where you live?  Yes Need for family participation in patient care:  No (Coment)  Care giving concerns:    Patient requested PTAR transport because she felt that her husband could not transport her safely to the SNF because of the vehicles that they have. No other concerns were reported.    Social Worker assessment / plan:    CSW met with the patient at bedside. Patient was alert and oreinted. 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 Patient has never been to a skilled nursing facility but she is aware of what they are. She wants the best possible care for herself. She is familiar with [facility] and [facility]. She gave permission for the social worker to send her information out too. The patient is married and does have an adult daughter. She reported no concerns in her current living arrangements. CSW answered patients questions skilled nursing and answered insurance questions. Patient stated that she has family support. Patient had no other concerns. Patient asked to be updated with bed offers.   Employment status:  Retired Office manager PT Recommendations:  [place] / Referral to community resources:  [place]  Patient/Family's Response to care: Patient is understanding of her current medical condition. Patient wants the best care for herself.   Patient/Family's Understanding of and Emotional Response to Diagnosis, Current Treatment, and Prognosis:  Patient was receptive to going to a skilled nursing facility. Patient did have questions and feels that she can receive the best results at a skilled nursing.   Emotional Assessment Appearance:  Appears stated age Attitude/Demeanor/Rapport:  Unable to Assess Affect (typically observed):  Unable to Assess Orientation:  Oriented to Self, Oriented to Place, Oriented to  Time, Oriented to Situation Alcohol / Substance use:  Not Applicable Psych involvement (Current and /or in the community):  No (Comment)  Discharge Needs  Concerns to be addressed:  Discharge Planning Concerns Readmission within the last 30 days:  No Current discharge risk:  Dependent with Mobility Barriers to Discharge:  [place], [place] 01/03/2019, 2:34 PM

## 2019-01-04 LAB — GLUCOSE, CAPILLARY
Glucose-Capillary: 103 mg/dL — ABNORMAL HIGH (ref 70–99)
Glucose-Capillary: 97 mg/dL (ref 70–99)
Glucose-Capillary: 145 mg/dL — ABNORMAL HIGH (ref 70–99)
Glucose-Capillary: 120 mg/dL — ABNORMAL HIGH (ref 70–99)

## 2019-01-04 NOTE — Progress Notes (Signed)
    Patient doing well 4 days PO S/P 3 level lumbar fusion. Leg pain resolved and PO LBP improving steadily. She is up and walking with steady progess in PT/OT. Pending SNF transfer per PT recs. Awaiting insurance auth for [employer]. She has been eating and passing gas, no BM yet but denies symptoms of constipation.    Physical Exam: BP (!) 158/79 (BP Location: Left Arm)   Pulse 67   Temp 98.7 F (37.1 C) (Oral)   Resp 18   Ht 5\' 6"  (1.676 m)   Wt 75.8 kg   SpO2 99%   BMI 26.99 kg/m    Dressing in place, CDI, pt sitting up in chair brace on  NVI  POD #4 s/p 3 level lumbar fusion  - Resolved radicular px and well controlled PO LBP - up with PT/OT, encourage ambulation  - TLSO brace on at all times when OOB except nighttime trips to bathroom, straps go under arms  - Percocet for pain, Valium for muscle spasms - likely d/c to SNF tomorrow pending ins auth with f/u in 2 weeks in office   - OK to shower PO day 5. Remove bulky outer bandage and shower over steri strips

## 2019-01-04 NOTE — Progress Notes (Signed)
Occupational Therapy Treatment Patient Details Name: Felicia Shelton MRN: [id number] DOB: [birth year] Today's Date: 01/04/2019    History of present illness Pt is a 78 y.o. female, s/p L2-L5 decompression and fusion surgery. Significant PMH of R leg pain from lumbar stenosis, DM, HTN, hyperlipidemia, neuromuscular disorder.   OT comments  Pt progressing towards established OT goals. Pt performing toileting, hand hygiene, and functional mobility with Min Guard A for safety. Pt requiring Min cues for donning brace. Pt also continues to require cues for compensatory techniques for adhere to back precautions during ADLs. Continue to recommend dc to SNF and will continue to follow acutely as admitted.    Follow Up Recommendations  SNF;Supervision/Assistance - 24 hour    Equipment Recommendations  3 in 1 bedside commode    Recommendations for Other Services      Precautions / Restrictions Precautions Precautions: Back Precaution Booklet Issued: Yes (comment) Precaution Comments: Pt recalling 3/3 precautions. Continues to require cues for back precautions during ADLs and to use compensatory tehcnique. Required Braces or Orthoses: Spinal Brace Spinal Brace: Thoracolumbosacral orthotic;Applied in sitting position Restrictions Weight Bearing Restrictions: No       Mobility Bed Mobility Overal bed mobility: Needs Assistance Bed Mobility: Sidelying to Sit;Rolling Rolling: Min assist Sidelying to sit: Min guard(with rail)       General bed mobility comments: Pt attempting initially to get OOB without using log roll. Cues for techniques and required Min A to inititiate rolling. Min Guard A for safety with trunk elevation.   Transfers Overall transfer level: Needs assistance Equipment used: Rolling walker (2 wheeled) Transfers: Sit to/from Stand Sit to Stand: Min guard         General transfer comment: Min Guard A for safety and cues for forward weight shift    Balance Overall  balance assessment: Needs assistance Sitting-balance support: Feet supported Sitting balance-Leahy Scale: Fair     Standing balance support: Bilateral upper extremity supported;Single extremity supported Standing balance-Leahy Scale: Poor                             ADL either performed or assessed with clinical judgement   ADL Overall ADL's : Needs assistance/impaired     Grooming: Wash/dry hands;Min guard;Standing           Upper Body Dressing : Min guard;Sitting;Cueing for sequencing;Cueing for compensatory techniques Upper Body Dressing Details (indicate cue type and reason): Min cues for sequencing of donning brace.     Toilet Transfer: Ambulation;Regular Toilet;Grab bars;RW;Min Psychiatric nurse Details (indicate cue type and reason): Min Guard A for safety and cues for forward weight shift Toileting- Clothing Manipulation and Hygiene: Min guard;Sitting/lateral lean Toileting - Clothing Manipulation Details (indicate cue type and reason): Cues for compensatory techniques for peri care. Min Guard A for safety     Functional mobility during ADLs: Min guard;Rolling walker General ADL Comments: Demonstrating increased balance and strength this session. Continues to require cues for compensatory tehcniques to adhere to back precautions during ADLs     Vision       Perception     Praxis      Cognition Arousal/Alertness: Awake/alert Behavior During Therapy: WFL for tasks assessed/performed Overall Cognitive Status: Impaired/Different from baseline Area of Impairment: Memory;Safety/judgement;Problem solving                     Memory: Decreased recall of precautions   Safety/Judgement: Decreased awareness of safety  Problem Solving: Slow processing;Requires verbal cues;Difficulty sequencing General Comments: Continues to require increased cues for use of compensatory techniques for ADLs. Upon arrival, pt attempting to get OOB to go to  bathroom with RN and not using log roll technique. Requiring cues to recall tehcniques for bed mobility        Exercises     Shoulder Instructions       General Comments      Pertinent Vitals/ Pain       Pain Assessment: Faces Faces Pain Scale: Hurts a little bit Pain Location: back Pain Descriptors / Indicators: Aching;Discomfort;Operative site guarding Pain Intervention(s): Monitored during session;Limited activity within patient's tolerance;Repositioned  Home Living                                          Prior Functioning/Environment              Frequency  Min 3X/week        Progress Toward Goals  OT Goals(current goals can now be found in the care plan section)     Acute Rehab OT Goals Patient Stated Goal: to get better OT Goal Formulation: With patient Time For Goal Achievement: 01/15/19 Potential to Achieve Goals: Good ADL Goals Pt Will Perform Lower Body Bathing: with supervision;with set-up;with adaptive equipment;sit to/from stand Pt Will Perform Lower Body Dressing: with set-up;with supervision;with adaptive equipment;sit to/from stand Pt Will Transfer to Toilet: with modified independence;bedside commode;ambulating Pt Will Perform Toileting - Clothing Manipulation and hygiene: with modified independence;sit to/from stand;with adaptive equipment Additional ADL Goal #1: Pt will independently verbalize 3/3 back precautions  Plan Discharge plan remains appropriate    Co-evaluation                 AM-PAC OT "6 Clicks" Daily Activity     Outcome Measure   Help from another person eating meals?: None Help from another person taking care of personal grooming?: A Little Help from another person toileting, which includes using toliet, bedpan, or urinal?: A Little Help from another person bathing (including washing, rinsing, drying)?: A Lot Help from another person to put on and taking off regular upper body clothing?: A  Lot Help from another person to put on and taking off regular lower body clothing?: A Lot 6 Click Score: 16    End of Session Equipment Utilized During Treatment: Rolling walker;Back brace  OT Visit Diagnosis: Unsteadiness on feet (R26.81);Other abnormalities of gait and mobility (R26.89);Pain Pain - part of body: (Back)   Activity Tolerance Patient tolerated treatment well   Patient Left in chair;with call bell/phone within reach;with nursing/sitter in room   Nurse Communication Mobility status        Time: 6761-9509 OT Time Calculation (min): 18 min  Charges: OT General Charges $OT Visit: 1 Visit OT Treatments $Self Care/Home Management : 8-22 mins  [place], OTR/L Acute Rehab Pager: [phone] Office: [place] 01/04/2019, 10:15 AM

## 2019-01-04 NOTE — Progress Notes (Signed)
CSW met with patient earlier today to confirm discharge plan to SNF. Patient changed her mind today and would prefer [facility] for SNF. CSW sent referral, confirmed that they are able to take the patient. CSW contacted [employer] to initiate insurance authorization. CSW updated patient.  CSW to follow.  Laveda Abbe, [place] Clinical Social Worker [phone]

## 2019-01-04 NOTE — Progress Notes (Signed)
Physical Therapy Treatment Patient Details Name: Felicia Shelton MRN: [id number] DOB: 21-May-1941 Today's Date: 01/04/2019    History of Present Illness Pt is a 78 y.o. female, s/p L2-L5 decompression and fusion surgery. Significant PMH of R leg pain from lumbar stenosis, DM, HTN, hyperlipidemia, neuromuscular disorder.    PT Comments    Patient progressing well towards PT goals. Tolerated transfers and gait training with min guard assist for balance/safety. Reviewed 3/3 back precautions and log roll technique. Pt able to state precautions but needs cues to adhere to them during functional mobility. Able to donn TLSO with setup. Encouraged walking with nursing later today. Will continue to follow and progress as tolerated.     Follow Up Recommendations  SNF     Equipment Recommendations  Rolling walker with 5" wheels    Recommendations for Other Services       Precautions / Restrictions Precautions Precautions: Back Precaution Booklet Issued: No Precaution Comments: Reviewed 3/3 back precautions Required Braces or Orthoses: Spinal Brace Spinal Brace: Thoracolumbosacral orthotic;Applied in sitting position Restrictions Weight Bearing Restrictions: No    Mobility  Bed Mobility Overal bed mobility: Needs Assistance Bed Mobility: Rolling;Sidelying to Sit Rolling: Min guard Sidelying to sit: Min guard;HOB elevated       General bed mobility comments: Good demo of log roll techinque with cues and use of rail. Increased time.   Transfers Overall transfer level: Needs assistance Equipment used: Rolling walker (2 wheeled) Transfers: Sit to/from Stand Sit to Stand: Min guard         General transfer comment: Min Guard A for safety; stood from EOB x1, transferred to chair post ambulation.  Ambulation/Gait Ambulation/Gait assistance: Min guard Gait Distance (Feet): 110 Feet Assistive device: Rolling walker (2 wheeled) Gait Pattern/deviations: Step-through pattern;Decreased  step length - right;Decreased step length - left;Decreased stride length Gait velocity: .82 ft/sec Gait velocity interpretation: <1.8 ft/sec, indicate of risk for recurrent falls General Gait Details: Slow, guarded gait with cues for RW proximity and upright posture.    Stairs             Wheelchair Mobility    Modified Rankin (Stroke Patients Only)       Balance Overall balance assessment: Needs assistance Sitting-balance support: Feet supported;No upper extremity supported Sitting balance-Leahy Scale: Good Sitting balance - Comments: Able to sit EOB and donn TLSO with setup.   Standing balance support: During functional activity;Bilateral upper extremity supported Standing balance-Leahy Scale: Poor Standing balance comment: Requires UE support in standing.                             Cognition Arousal/Alertness: Awake/alert Behavior During Therapy: WFL for tasks assessed/performed Overall Cognitive Status: Impaired/Different from baseline Area of Impairment: Memory;Safety/judgement;Problem solving                     Memory: Decreased recall of precautions Following Commands: Follows one step commands with increased time Safety/Judgement: Decreased awareness of safety   Problem Solving: Slow processing;Requires verbal cues General Comments: Able to state precautions but difficulty incorporating them into ADLs/functional task.       Exercises      General Comments        Pertinent Vitals/Pain Pain Assessment: Faces Faces Pain Scale: Hurts a little bit Pain Location: back Pain Descriptors / Indicators: Discomfort;Operative site guarding Pain Intervention(s): Monitored during session;Repositioned    Home Living  Prior Function            PT Goals (current goals can now be found in the care plan section) Acute Rehab PT Goals Patient Stated Goal: to get better Progress towards PT goals: Progressing  toward goals    Frequency    Min 5X/week      PT Plan Current plan remains appropriate    Co-evaluation              AM-PAC PT "6 Clicks" Mobility   Outcome Measure  Help needed turning from your back to your side while in a flat bed without using bedrails?: A Little Help needed moving from lying on your back to sitting on the side of a flat bed without using bedrails?: A Little Help needed moving to and from a bed to a chair (including a wheelchair)?: A Little Help needed standing up from a chair using your arms (e.g., wheelchair or bedside chair)?: A Little Help needed to walk in hospital room?: A Little Help needed climbing 3-5 steps with a railing? : A Little 6 Click Score: 18    End of Session Equipment Utilized During Treatment: Back brace;Gait belt Activity Tolerance: Patient tolerated treatment well Patient left: in chair;with call bell/phone within reach Nurse Communication: Mobility status PT Visit Diagnosis: Unsteadiness on feet (R26.81);Other abnormalities of gait and mobility (R26.89);Pain     Time: 1100-1116 PT Time Calculation (min) (ACUTE ONLY): 16 min  Charges:  $Gait Training: 8-22 mins                     Wray Kearns, PT, DPT Acute Rehabilitation Services Pager [phone] Office [phone]       [place] 01/04/2019, 11:32 AM

## 2019-01-05 DIAGNOSIS — M6281 Muscle weakness (generalized): Secondary | ICD-10-CM | POA: Diagnosis not present

## 2019-01-05 DIAGNOSIS — E785 Hyperlipidemia, unspecified: Secondary | ICD-10-CM | POA: Diagnosis not present

## 2019-01-05 DIAGNOSIS — R488 Other symbolic dysfunctions: Secondary | ICD-10-CM | POA: Diagnosis not present

## 2019-01-05 DIAGNOSIS — M1380 Other specified arthritis, unspecified site: Secondary | ICD-10-CM | POA: Diagnosis not present

## 2019-01-05 DIAGNOSIS — M4316 Spondylolisthesis, lumbar region: Secondary | ICD-10-CM | POA: Diagnosis not present

## 2019-01-05 DIAGNOSIS — M5416 Radiculopathy, lumbar region: Secondary | ICD-10-CM | POA: Diagnosis not present

## 2019-01-05 DIAGNOSIS — M48062 Spinal stenosis, lumbar region with neurogenic claudication: Secondary | ICD-10-CM | POA: Diagnosis not present

## 2019-01-05 DIAGNOSIS — Z743 Need for continuous supervision: Secondary | ICD-10-CM | POA: Diagnosis not present

## 2019-01-05 DIAGNOSIS — E119 Type 2 diabetes mellitus without complications: Secondary | ICD-10-CM | POA: Diagnosis not present

## 2019-01-05 DIAGNOSIS — R2689 Other abnormalities of gait and mobility: Secondary | ICD-10-CM | POA: Diagnosis not present

## 2019-01-05 DIAGNOSIS — R52 Pain, unspecified: Secondary | ICD-10-CM | POA: Diagnosis not present

## 2019-01-05 DIAGNOSIS — K635 Polyp of colon: Secondary | ICD-10-CM | POA: Diagnosis not present

## 2019-01-05 DIAGNOSIS — M5489 Other dorsalgia: Secondary | ICD-10-CM | POA: Diagnosis not present

## 2019-01-05 DIAGNOSIS — G709 Myoneural disorder, unspecified: Secondary | ICD-10-CM | POA: Diagnosis not present

## 2019-01-05 DIAGNOSIS — M4326 Fusion of spine, lumbar region: Secondary | ICD-10-CM | POA: Diagnosis not present

## 2019-01-05 DIAGNOSIS — E559 Vitamin D deficiency, unspecified: Secondary | ICD-10-CM | POA: Diagnosis not present

## 2019-01-05 DIAGNOSIS — R279 Unspecified lack of coordination: Secondary | ICD-10-CM | POA: Diagnosis not present

## 2019-01-05 DIAGNOSIS — I10 Essential (primary) hypertension: Secondary | ICD-10-CM | POA: Diagnosis not present

## 2019-01-05 LAB — GLUCOSE, CAPILLARY
Glucose-Capillary: 109 mg/dL — ABNORMAL HIGH (ref 70–99)
Glucose-Capillary: 89 mg/dL (ref 70–99)
Glucose-Capillary: 116 mg/dL — ABNORMAL HIGH (ref 70–99)

## 2019-01-05 MED ORDER — DIAZEPAM 5 MG PO TABS: 5.0000 mg | ORAL_TABLET | Freq: Four times a day (QID) | ORAL | 0 refills | Status: DC | PRN

## 2019-01-05 MED ORDER — OXYCODONE-ACETAMINOPHEN 5-325 MG PO TABS: 1.0000 | ORAL_TABLET | ORAL | 0 refills | Status: DC | PRN

## 2019-01-05 MED FILL — Heparin Sodium (Porcine) Inj 1000 Unit/ML: INTRAMUSCULAR | Qty: 30 | Status: AC

## 2019-01-05 MED FILL — Sodium Chloride IV Soln 0.9%: INTRAVENOUS | Qty: 2000 | Status: AC

## 2019-01-05 NOTE — Consult Note (Signed)
[facility] CM Primary Care Navigator  01/05/2019  Felicia Shelton 02-21-1941 [id number]   Met withpatient at the bedside toidentify possible discharge needs.  Patient reportshaving "worsening bad back pain causing inability to stand for a while without holding for support" that had led to this admission/ surgery. (radiculopathy and spinal stenosis underwent right sided L2-L3, L3-L4, L4-L5 transforaminal lumbar interbody fusion)  Patientreports having no primary care provider but endorses Dr.Ajay Kadakia with [facility] as her primary care provider and prefers to keep him as primary care provider to follow-up with, since he has been seeing her for about 20 years.   [place] difficulty so far. She is aware to notify primary provider for future issues or needs if any, pertaining to her medications.  Patientstates that she has beenmanaging her own medications at home using "pill box" system filled every week.  Patient reports that she has been driving prior to this admission/ surgery but husband Felicia Shelton) will be able to providetransportation to her doctors' appointments post discharge.  Patient reports being independent and caregiver for herself at home prior to admission/ surgery. Husband can provide some assistance with needs if any.   Anticipated plan for dischargeis skilled nursing facility (SNF)for rehab per therapy recommendation and hopeful to do things for herself when she gets home.  Patientvoiced understandingto call cardiologist/ primarycare provider's office whenshereturnshome,for a post discharge follow-up visitwithin1- 2 weeksor sooner if needs arise.Patient letter (with PCP's contact number) was provided asa reminder.   Discussed with patientaboutTHN CM services available for health management andresourcesat homeand she denies any needs or concerns for now  and had indicated being capable and knowledgeable of ways to manage her health conditions.   Patientexpressedunderstandingto seekreferral from cardiologist/ primary care provider to [facility] care management asdeemed necessary and appropriatefor anyservicesin the nearfuture-onceshereturns tohome.   [facility] care management information was provided for future needsthat shemay have.    For additional questions please contact:  Edwena Felty A. Krew Hortman, BSN, RN-BC [facility] PRIMARY CARE Navigator Cell: [phone]

## 2019-01-05 NOTE — Care Management Important Message (Signed)
Important Message  Patient Details  Name: Felicia Shelton MRN: [id number] Date of Birth: 01/01/1941   Medicare Important Message Given:  Yes    Barb Merino Zaira Iacovelli 01/05/2019, 12:15 PM

## 2019-01-05 NOTE — Discharge Summary (Signed)
Patient ID: Felicia Shelton MRN: [id number] DOB/AGE: 77/13/1942 77 y.o.  Admit date: 12/31/2018 Discharge date: 01/05/19  Admission Diagnoses:  Active Problems:   Radiculopathy   Discharge Diagnoses:  Same  Past Medical History:  Diagnosis Date  . Allergy   . Arthritis    back  . Diabetes mellitus without complication ([place])   . Hx of adenomatous colonic polyp 05/26/2008  . Hyperlipidemia   . Hypertension   . Neuromuscular disorder ([place])     Surgeries: Procedure(s): RIGHT - SIDED LUMBAR 2-3,LUMBAR 3-4, LUMBAR 4-5 TRANSFORAMINAL LUMBAR INTERBODY FUSION WITH INSTRUMENTATION AND ALLOGRAFT on 12/31/2018   Consultants: None  Discharged Condition: Improved  Hospital Course: Felicia Shelton is an 78 y.o. female who was admitted 12/31/2018 for operative treatment of radiculopathy and spinal stenosis. Patient has severe unremitting pain that affects sleep, daily activities, and work/hobbies. After pre-op clearance the patient was taken to the operating room on 12/31/2018 and underwent  Procedure(s): RIGHT - SIDED LUMBAR 2-3,LUMBAR 3-4, LUMBAR 4-5 TRANSFORAMINAL LUMBAR INTERBODY FUSION WITH INSTRUMENTATION AND ALLOGRAFT.    Patient was given perioperative antibiotics:  Anti-infectives (From admission, onward)   Start     Dose/Rate Route Frequency Ordered Stop   12/31/18 2200  ceFAZolin (ANCEF) IVPB 2g/100 mL premix     2 g 200 mL/hr over 30 Minutes Intravenous Every 8 hours 12/31/18 1839 01/01/19 1225   12/31/18 0600  ceFAZolin (ANCEF) IVPB 2g/100 mL premix     2 g 200 mL/hr over 30 Minutes Intravenous On call to O.R. 12/31/18 0546 12/31/18 1404   12/31/18 0548  ceFAZolin (ANCEF) 2-4 GM/100ML-% IVPB    Note to Pharmacy:  Tamsen Snider   : cabinet override      12/31/18 0548 12/31/18 0749       Patient was given sequential compression devices, early ambulation to prevent DVT.  Patient benefited maximally from hospital stay and there were no complications.    Recent vital signs:    Patient Vitals for the past 24 hrs:  BP Temp Temp src Pulse Resp SpO2  01/05/19 1151 (!) 159/75 98.4 F (36.9 C) Oral 93 18 100 %  01/05/19 0746 (!) 141/61 98.3 F (36.8 C) Oral 79 19 96 %  01/05/19 0325 140/64 98.3 F (36.8 C) Oral 79 18 96 %  01/04/19 2334 130/69 98.7 F (37.1 C) Oral 75 20 95 %  01/04/19 1954 (!) 146/67 98.5 F (36.9 C) Oral 66 20 98 %  01/04/19 1541 (!) 158/79 98.7 F (37.1 C) Oral 67 18 99 %     Discharge Medications:   Allergies as of 01/05/2019   No Known Allergies     Medication List    TAKE these medications   acetaminophen 650 MG CR tablet Commonly known as:  TYLENOL Take 1,300 mg by mouth daily as needed for pain.   amLODipine 5 MG tablet Commonly known as:  NORVASC Take 5 mg by mouth daily.   ASPERCREME LIDOCAINE 4 % Ptch Generic drug:  Lidocaine Place 1 patch onto the skin daily as needed (pain.).   atorvastatin 40 MG tablet Commonly known as:  LIPITOR Take 40 mg by mouth daily.   diazepam 5 MG tablet Commonly known as:  VALIUM Take 1 tablet (5 mg total) by mouth every 6 (six) hours as needed for muscle spasms.   fexofenadine-pseudoephedrine 180-240 MG 24 hr tablet Commonly known as:  ALLEGRA-D 24 Take 1 tablet by mouth daily as needed (allergies.).   gabapentin 300 MG capsule Commonly  known as:  NEURONTIN Take 600 mg by mouth every evening.   hydrocortisone 2.5 % rectal cream Commonly known as:  ANUSOL-HC Place 1 application rectally 2 (two) times daily as needed for hemorrhoids or itching.   JANUVIA 100 MG tablet Generic drug:  sitaGLIPtin Take 100 mg by mouth daily.   lisinopril 20 MG tablet Commonly known as:  PRINIVIL,ZESTRIL Take 20 mg by mouth daily.   metoprolol tartrate 50 MG tablet Commonly known as:  LOPRESSOR Take 50 mg by mouth every evening.   oxyCODONE-acetaminophen 5-325 MG tablet Commonly known as:  PERCOCET/ROXICET Take 1-2 tablets by mouth every 4 (four) hours as needed for moderate pain or severe  pain.   pantoprazole 40 MG tablet Commonly known as:  PROTONIX Take 40 mg by mouth every evening.   TENS WIRED PAIN MANAGEMENT Devi 1 Dose by Does not apply route See admin instructions. Use for up to 1.5 hours (in 30 minutes intervals) as needed for pain.   Vitamin D-3 125 MCG (5000 UT) Tabs Take 5,000 Units by mouth every Monday, Wednesday, and Friday.      Diagnostic Studies: Dg Lumbar Spine 2-3 Views  Result Date: 12/31/2018 CLINICAL DATA:  Lumbar fusion EXAM: DG C-ARM 61-120 MIN; LUMBAR SPINE - 2-3 VIEW COMPARISON:  Film from earlier in the same day. FLUOROSCOPY TIME:  Radiation Exposure Index (as provided by the fluoroscopic device): 67.06 mGy If the device does not provide the exposure index: Fluoroscopy Time:  2 minutes and 9 seconds Number of Acquired Images:  3 FINDINGS: Pedicle screws are now seen from L2-L5 with interbody fusion at all 3 levels. Posterior fixation is noted. The numbering nomenclature is similar to that utilized on prior plain film examination. IMPRESSION: L2-L5 fusion. Electronically Signed   By: Inez Catalina M.D.   On: 12/31/2018 19:06   Dg Lumbar Spine 1 View  Result Date: 12/31/2018 CLINICAL DATA:  Portable operative imaging for lumbar fusion. EXAM: LUMBAR SPINE - 1 VIEW COMPARISON:  Lumbar MRI, 09/09/2018 FINDINGS: Portable operative lateral view of the lumbar spine shows a needle with its tip projecting 6.8 cm posterior to the posterior margin of the L1-L2 disc. A second inferior needle has its tip projecting 6 cm posterior to the posterior margin of the L4-L5 disc. Vertebral labeling was consistent with prior lumbar MRI. IMPRESSION: Portable operative lumbar spine imaging for surgical localization as described. Electronically Signed   By: Lajean Manes M.D.   On: 12/31/2018 11:27   Dg C-arm 1-60 Min  Result Date: 12/31/2018 CLINICAL DATA:  Lumbar fusion EXAM: DG C-ARM 61-120 MIN; LUMBAR SPINE - 2-3 VIEW COMPARISON:  Film from earlier in the same day.  FLUOROSCOPY TIME:  Radiation Exposure Index (as provided by the fluoroscopic device): 67.06 mGy If the device does not provide the exposure index: Fluoroscopy Time:  2 minutes and 9 seconds Number of Acquired Images:  3 FINDINGS: Pedicle screws are now seen from L2-L5 with interbody fusion at all 3 levels. Posterior fixation is noted. The numbering nomenclature is similar to that utilized on prior plain film examination. IMPRESSION: L2-L5 fusion. Electronically Signed   By: Inez Catalina M.D.   On: 12/31/2018 19:06    Disposition:    POD #5s/p 3 level lumbar fusion  - Resolved radicular px and well controlled PO LBP - up with PT/OT, encourage ambulation - TLSO brace on at all times when OOB except nighttime trips to bathroom, straps go under arms - Percocet for pain, Valium for muscle spasms - d/cto  SNF today with f/u in 2 weeksin office   - OK to shower now. Remove bulky outer bandage and shower over steri strips - pt requesting enema for BM prior to D/C  -Scripts for pain sent to pharm -D/C instructions sheet printed and in chart  Signed: Lennie Muckle Coco Sharpnack 01/05/2019, 12:10 PM

## 2019-01-05 NOTE — Progress Notes (Signed)
    Patient doing well 5 days PO S/P 3 level lumbar fusion. Leg pain resolved and PO LBP improving steadily. She is up and walking with steady progess in PT/OT. Pending SNF transfer per PT recs. Approved for [facility]. She has been eating and passing gas, no BM yet but denies symptoms of constipation.   Physical Exam: BP (!) 159/75   Pulse 93   Temp 98.4 F (36.9 C) (Oral)   Resp 18   Ht 5\' 6"  (1.676 m)   Wt 75.8 kg   SpO2 100%   BMI 26.99 kg/m   Dressing in place, CDI, pt sitting up in chair brace on  NVI  POD #5 s/p 3 level lumbar fusion  - Resolved radicular px and well controlled PO LBP - up with PT/OT, encourage ambulation             - TLSO brace on at all times when OOB except nighttime trips to bathroom, straps go under arms  - Percocet for pain, Valium for muscle spasms - d/c to SNF today with f/u in 2 weeks in office   - OK to shower now. Remove bulky outer bandage and shower over steri strips  - pt requesting enema for BM prior to D/C

## 2019-01-05 NOTE — Progress Notes (Signed)
Patient being discharged to [facility]. Report given to facility. Patient being transported by [facility]. Discharge information and all belongings with patient.

## 2019-01-05 NOTE — Progress Notes (Addendum)
Physical Therapy Treatment Patient Details Name: Felicia Shelton MRN: [id number] DOB: 26-May-1941 Today's Date: 01/05/2019    History of Present Illness Pt is a 78 y.o. female, s/p L2-L5 decompression and fusion surgery. Significant PMH of R leg pain from lumbar stenosis, DM, HTN, hyperlipidemia, neuromuscular disorder.    PT Comments    Patient progressing well towards PT goals. Complaining of constipation today and eager to have a BM. Encouraged increasing activity and ambulation to assist in this process. Tolerated bed mobility with HOB flat, but still needed use of rail to perform log roll technique. Tolerated gait training with min guard assist for safety requiring cues for upright posture and to adhere to back precautions. Gait speed improved slightly from yesterday at 1/05 ft/sec but still puts pt at increased risk for falls. Able to recall 2/3 back precautions. Plans to d/c to SNF. Will follow.    Follow Up Recommendations  SNF     Equipment Recommendations  Rolling walker with 5" wheels    Recommendations for Other Services       Precautions / Restrictions Precautions Precautions: Back Precaution Booklet Issued: No Precaution Comments: Reviewed 3/3 back precautions Required Braces or Orthoses: Spinal Brace Spinal Brace: Thoracolumbosacral orthotic;Applied in sitting position Restrictions Weight Bearing Restrictions: No    Mobility  Bed Mobility Overal bed mobility: Needs Assistance Bed Mobility: Rolling;Sidelying to Sit;Sit to Sidelying Rolling: Min guard Sidelying to sit: Min guard     Sit to sidelying: Min guard General bed mobility comments: Good demo of log roll technique with HOB flat, but heavy use of rail, increased time.   Transfers Overall transfer level: Needs assistance Equipment used: Rolling walker (2 wheeled) Transfers: Sit to/from Stand Sit to Stand: Min guard         General transfer comment: Min Guard A for safety; stood from [employer], from  chair x1. Cues for hand placement and pt pulling up on RW, difficulty transitioning hands from bed to RW due to weakness.   Ambulation/Gait Ambulation/Gait assistance: Min guard Gait Distance (Feet): 115 Feet Assistive device: Rolling walker (2 wheeled) Gait Pattern/deviations: Step-through pattern;Decreased step length - right;Decreased step length - left;Decreased stride length Gait velocity: 1.05 ft/sec Gait velocity interpretation: <1.31 ft/sec, indicative of household ambulator General Gait Details: Slow, guarded gait with cues for RW proximity and upright posture.    Stairs             Wheelchair Mobility    Modified Rankin (Stroke Patients Only)       Balance Overall balance assessment: Needs assistance Sitting-balance support: Feet supported;No upper extremity supported Sitting balance-Leahy Scale: Good     Standing balance support: During functional activity;Bilateral upper extremity supported Standing balance-Leahy Scale: Poor Standing balance comment: Requires UE support in standing.                             Cognition Arousal/Alertness: Awake/alert Behavior During Therapy: WFL for tasks assessed/performed Overall Cognitive Status: Impaired/Different from baseline Area of Impairment: Memory                     Memory: Decreased short-term memory;Decreased recall of precautions         General Comments: Able to state 2/3 back precautions, needs cues to incorporate them into functional mobility.       Exercises      General Comments        Pertinent Vitals/Pain Pain Assessment: No/denies pain  Home Living                      Prior Function            PT Goals (current goals can now be found in the care plan section) Progress towards PT goals: Progressing toward goals    Frequency    Min 5X/week      PT Plan Current plan remains appropriate    Co-evaluation              AM-PAC PT "6  Clicks" Mobility   Outcome Measure  Help needed turning from your back to your side while in a flat bed without using bedrails?: A Little Help needed moving from lying on your back to sitting on the side of a flat bed without using bedrails?: A Little Help needed moving to and from a bed to a chair (including a wheelchair)?: A Little Help needed standing up from a chair using your arms (e.g., wheelchair or bedside chair)?: A Little Help needed to walk in hospital room?: A Little Help needed climbing 3-5 steps with a railing? : A Little 6 Click Score: 18    End of Session Equipment Utilized During Treatment: Back brace;Gait belt Activity Tolerance: Patient tolerated treatment well Patient left: in chair;with call bell/phone within reach Nurse Communication: Mobility status PT Visit Diagnosis: Unsteadiness on feet (R26.81);Other abnormalities of gait and mobility (R26.89)     Time: 9758-8325 PT Time Calculation (min) (ACUTE ONLY): 13 min  Charges:  $Gait Training: 8-22 mins                     Wray Kearns, PT, DPT Acute Rehabilitation Services Pager [phone] Office [phone]       Scalp Level 01/05/2019, 9:43 AM

## 2019-01-05 NOTE — Progress Notes (Signed)
Patient is set to discharge to [facility] today. Patient & spouse, [name], aware. Discharge packet given to RN. [facility] called for transport.   Please call report to: [place], LCSW Clinical Social Worker [phone]

## 2019-01-05 NOTE — Clinical Social Work Placement (Signed)
Nurse to call report to [phone]     CLINICAL SOCIAL WORK PLACEMENT  NOTE  Date:  01/05/2019  Patient Details  Name: Felicia Shelton MRN: [id number] Date of Birth: December 01, 1940  Clinical Social Work is seeking post-discharge placement for this patient at the [place] level of care (*CSW will initial, date and re-position this form in  chart as items are completed):  Yes   Patient/family provided with [place] Work Department's list of facilities offering this level of care within the geographic area requested by the patient (or if unable, by the patient's family).  Yes   Patient/family informed of their freedom to choose among providers that offer the needed level of care, that participate in Medicare, Medicaid or managed care program needed by the patient, have an available bed and are willing to accept the patient.  Yes   Patient/family informed of [facility]'s ownership interest in [facility] and [facility], as well as of the fact that they are under no obligation to receive care at these facilities.  PASRR submitted to EDS on       PASRR number received on       Existing PASRR number confirmed on       FL2 transmitted to all facilities in geographic area requested by pt/family on       FL2 transmitted to all facilities within larger geographic area on       Patient informed that his/her managed care company has contracts with or will negotiate with certain facilities, including the following:        Yes   Patient/family informed of bed offers received.  Patient chooses bed at [facility]     Physician recommends and patient chooses bed at      Patient to be transferred to [facility] on 01/05/19.  Patient to be transferred to facility by PT[facility]     Patient family notified on 01/05/19 of transfer.  Name of family member notified:  Self     PHYSICIAN       Additional Comment:     _______________________________________________ Geralynn Ochs, LCSW 01/05/2019, 1:28 PM

## 2019-01-06 DIAGNOSIS — M4326 Fusion of spine, lumbar region: Secondary | ICD-10-CM | POA: Diagnosis not present

## 2019-01-06 DIAGNOSIS — E785 Hyperlipidemia, unspecified: Secondary | ICD-10-CM | POA: Diagnosis not present

## 2019-01-06 DIAGNOSIS — M48062 Spinal stenosis, lumbar region with neurogenic claudication: Secondary | ICD-10-CM | POA: Diagnosis not present

## 2019-01-06 DIAGNOSIS — E119 Type 2 diabetes mellitus without complications: Secondary | ICD-10-CM | POA: Diagnosis not present

## 2019-01-06 LAB — TYPE AND SCREEN
ABO/RH(D): O POS
Antibody Screen: POSITIVE
Unit division: 0
Unit division: 0

## 2019-01-06 LAB — BPAM RBC
Blood Product Expiration Date: 202003102359
Blood Product Expiration Date: 202003102359
Unit Type and Rh: 5100
Unit Type and Rh: 5100

## 2019-01-11 ENCOUNTER — Other Ambulatory Visit: Payer: Self-pay | Admitting: *Deleted

## 2019-01-11 NOTE — Patient Outreach (Signed)
[facility]) Care Management  01/11/2019  Felicia Shelton 1940/11/26 [id number]   Facility site visit to [facility]. Discharge planner unavailable to discuss patient's discharge plan or progress. Patient admitted to SNF on 12/29/18 after a hospitalization for operative treatment of radiculopathy and spinal stenosis. Patient had severe unremitting pain that affects sleep, daily activities, and work/hobbies. Planned discharge date is not set and discharge disposition plan is home with spouse.  Patient will have [place] at discharge. Patient was evaluated for community based chronic disease management services with [facility] care Management Program as a benefit of patient's [employer]icare.   Went to patient's bedside to speak with patient and further assess for care management needs.  Patient was sitting up in wheelchair wearing a back brace.  Patient noted to be alert, oriented and educated about her health needs.  Patient states she does have type 2 diabetes which is managed with [place]. Patient states her main support person is her spouse. Patient states transportation needs will be provided by her spouse. Patient states medication management provided by herself.  Patient discussed medication cost is not currently a barrier to care. Introduced Psychologist, forensic and gave patient [facility] Patient Packet with my contact information included.  Patient agreed to [place] services.  Patient gave [phone] as the best number to reach her.  Plan to place a referral for [place] to engage patient for transition of care and evaluate for monthly home visits closer to patient discharge.  Plan to monitor for discharge.  [facility] Care Management services do not interfere with or replace any services arranged by the facility discharge planner.   Plan to make [facility] UM team member aware [facility] will be following  for care management.   For additional questions please contact:   Ardath Lepak RN, [facility] Liaison  [phone]) Business Mobile [phone]) Toll free office

## 2019-01-13 DIAGNOSIS — M4316 Spondylolisthesis, lumbar region: Secondary | ICD-10-CM | POA: Diagnosis not present

## 2019-01-15 DIAGNOSIS — M545 Low back pain: Secondary | ICD-10-CM | POA: Diagnosis not present

## 2019-01-15 DIAGNOSIS — Z981 Arthrodesis status: Secondary | ICD-10-CM | POA: Diagnosis not present

## 2019-01-18 ENCOUNTER — Other Ambulatory Visit: Payer: Self-pay | Admitting: *Deleted

## 2019-01-18 NOTE — Patient Outreach (Signed)
Referral received from HTA as urgent transition of care, pt hospitalized 2/13-2/18/20 and then transferred to [facility] discharge 01/13/19 with radiculopathy, fusion of spine lumbar region, history of HTN. Outreach call to pt for transition of care, no answer to either telephone [phone] or [phone], left voicemail on both phones requesting return phone call,  RN CM mailed unsuccessful outreach letter to pt home.  PLAN Outreach pt in 3-4 business days.  Jacqlyn Larsen [facility], [place] Coordinator [phone]

## 2019-01-19 ENCOUNTER — Ambulatory Visit: Payer: PPO | Admitting: Sports Medicine

## 2019-01-21 ENCOUNTER — Encounter: Payer: Self-pay | Admitting: Internal Medicine

## 2019-01-21 ENCOUNTER — Encounter: Payer: Self-pay | Admitting: *Deleted

## 2019-01-21 ENCOUNTER — Other Ambulatory Visit: Payer: Self-pay | Admitting: *Deleted

## 2019-01-21 NOTE — Patient Outreach (Signed)
Referral received telephonic HTA, pt discharged [facility] 01/13/19 post op radiculopathy fusion of spine lumbar region, history HTN, DM 2 (AIC 6.4). Outreach call to pt/ 2nd attempt, spoke with pt, HIPAA verified, pt reports she is doing well at home, she has rollator walker, side rails for her bed, pt in process of getting quotes to have shower remodeled for walk out shower and states she will be calling her medicare insurance plan to inquire if they reimburse for any of this.  Pt has already followed up with her surgeon, has all medications and taking as prescribed, has assistance of her spouse in the home, states [facility] contacted her but pt did not think she needed home health so she refused, pt states " there's a rehab/ PT place right at my doctor's office, if I need it in the future, I will talk with him about it"  Pt feels diabetes is under control and "tries to watch what I'm eating"  Pt agreeable to participate in transition of care program.  RN CM faxed barrier letter and today's note to primary MD Dr. Doylene Canard.  THN CM Care Plan Problem One     Most Recent Value  Care Plan Problem One  Recent surgery fusion of spine   Role Documenting the Problem One  Care Management Coordinator  Care Plan for Problem One  Active  THN Long Term Goal   Pt will recuperate successfully at home without complications within 31 days  THN Long Term Goal Start Date  01/21/19  Interventions for Problem One Long Term Goal  RN CM reviewed plan of care with pt, pt agreeable to participate in transition of care program, completed transition of care, reviewed medications, pt has 24 hour nurse line magnet, gave pt RN CM contact number  THN CM Short Term Goal #1   Pt will use safety precautions within 30 days  THN CM Short Term Goal #1 Start Date  01/21/19  Interventions for Short Term Goal #1  RN CM reviewed safety precautions, importance of using walker as needed, asking spouse for help as needed  THN CM Short  Term Goal #2   Pt post op surgical incision will heal without complication wtihin 30 days  THN CM Short Term Goal #2 Start Date  01/21/19  Interventions for Short Term Goal #2  RN CM reviewed importance of good CBG control to promote healing, adequate protein in diet, encouraged pt to stay well hydrated      PLAN Continue weekly transition of care  Jacqlyn Larsen [facility] [place] Coordinator [phone]

## 2019-01-25 ENCOUNTER — Other Ambulatory Visit: Payer: Self-pay | Admitting: *Deleted

## 2019-01-25 NOTE — Patient Outreach (Signed)
Report received from call a nurse line citing pt ask if she could take oxycontin after taking tylenol, nurse with call a nurse line instructed pt to take no more than 3000mg  tylenol in 24 hour period, RN CM called pt, HIPAA verified, pt states her pain has been relieved well with just tylenol and she has not needed to take oxycontin, pt verbalizes understanding of maximum dosage tylenol in 24 hour period, no further concerns voiced.  PLAN Continue weekly transition of care calls  Jacqlyn Larsen [facility], [place] Coordinator [phone]

## 2019-01-28 ENCOUNTER — Other Ambulatory Visit: Payer: Self-pay | Admitting: *Deleted

## 2019-01-28 ENCOUNTER — Other Ambulatory Visit: Payer: Self-pay

## 2019-01-28 NOTE — Patient Outreach (Signed)
Outreach call to patient for transition of care week 2, spoke with pt, HIPAA verified, pt reports "I'm doing well and getting stronger"  Pt states she barely has back pain, mostly stiffness, taking prescribed muscle relaxer methocarbamol 500mg  q 8 hours prn and pt states " this is helping"  Pt reports she is trying to walk more and will see surgeon on 02/09/19 and will discuss outpatient rehab although pt does not feel this is necessary for her.  RN CM reviewed side effects of medications and importance of not ambulating if dizzy or drowsy.    THN CM Care Plan Problem One     Most Recent Value  Care Plan Problem One  Recent surgery fusion of spine   Role Documenting the Problem One  Care Management Coordinator  Care Plan for Problem One  Active  THN Long Term Goal   Pt will recuperate successfully at home without complications within 31 days  THN Long Term Goal Start Date  01/21/19  Interventions for Problem One Long Term Goal  RN CM reviewed plan of care and all upcoming appointments, pt to see surgeon 02/09/19  [facility] CM Short Term Goal #1   Pt will use safety precautions within 30 days  THN CM Short Term Goal #1 Start Date  01/21/19  Interventions for Short Term Goal #1  Pt denies any falls,  RN CM reinforced safety precautions, reviewed side effects of medications such as drowsiness and dizziness  THN CM Short Term Goal #2   Pt post op surgical incision will heal without complication wtihin 30 days  THN CM Short Term Goal #2 Start Date  01/21/19  Interventions for Short Term Goal #2  Pt denies any issues with incision, RN CM reinforced eating heatlhy diet, staying well hydrated and taking medications as prescribed.      PLAN Continue weekly transition of care calls  Jacqlyn Larsen [facility], [place] Coordinator [phone]

## 2019-02-04 ENCOUNTER — Other Ambulatory Visit: Payer: Self-pay

## 2019-02-04 ENCOUNTER — Other Ambulatory Visit: Payer: Self-pay | Admitting: *Deleted

## 2019-02-04 NOTE — Patient Outreach (Signed)
Outreach call to pt for transition of care week 3, spoke with pt, HIPAA verified, pt reports she is doing well, states "back pain is much better, under control"  Pt states she took tylenol today with pain relieved.  Pt reports she has all medications and taking as prescribed.  THN CM Care Plan Problem One     Most Recent Value  Care Plan Problem One  Recent surgery fusion of spine   Role Documenting the Problem One  Care Management [place] for Problem One  Active  THN Long Term Goal   Pt will recuperate successfully at home without complications within 31 days  THN Long Term Goal Start Date  01/21/19  Interventions for Problem One Long Term Goal  RN CM reinforced plan of care, encouraged pt to alternate activity with rest.  THN CM Short Term Goal #1   Pt will use safety precautions within 30 days  THN CM Short Term Goal #1 Start Date  01/21/19  Interventions for Short Term Goal #1  RN CM reviewed safety precautions  THN CM Short Term Goal #2   Pt post op surgical incision will heal without complication wtihin 30 days  THN CM Short Term Goal #2 Start Date  01/21/19  Interventions for Short Term Goal #2  RN CM reviewed signs/ symptoms infection      PLAN Continue weekly transition of care calls  Jacqlyn Larsen [facility], [place] Coordinator [phone]

## 2019-02-11 ENCOUNTER — Other Ambulatory Visit: Payer: Self-pay | Admitting: *Deleted

## 2019-02-11 ENCOUNTER — Other Ambulatory Visit: Payer: Self-pay

## 2019-02-11 NOTE — Patient Outreach (Signed)
Outreach call to pt for transition of care week 4, spoke with pt, HIPAA verified, pt reports " I'm doing really well, trying to walk more"  Pt reports she is taking muscle relaxer for any back pain or stiffness with relief obtained, has all medications and taking as prescribed.  Reports " talked with my doctor on the phone and I can drive now"  No new concerns voiced.  THN CM Care Plan Problem One     Most Recent Value  Care Plan Problem One  Recent surgery fusion of spine   Role Documenting the Problem One  Care Management Coordinator  Care Plan for Problem One  Active  THN Long Term Goal   Pt will recuperate successfully at home without complications within 31 days  THN Long Term Goal Start Date  01/21/19  Interventions for Problem One Long Term Goal  RN CM reviewed plan of care with pt, will plan to discharge next week  THN CM Short Term Goal #1   Pt will use safety precautions within 30 days  THN CM Short Term Goal #1 Start Date  01/21/19  Interventions for Short Term Goal #1  RN CM reinforced safety precautions, pt is wearing brace as ordered.  THN CM Short Term Goal #2   Pt post op surgical incision will heal without complication wtihin 30 days  THN CM Short Term Goal #2 Start Date  01/21/19  [facility] CM Short Term Goal #2 Met Date  02/11/19  Interventions for Short Term Goal #2  Pt reports incision healed without complication, well approximated,  RN CM reinforced signs/ symptoms infection.      PLAN Outreach pt by telephone next week and plan to discharge at completion of transition of care program  Jacqlyn Larsen [facility], [place] Coordinator [phone]

## 2019-02-19 ENCOUNTER — Other Ambulatory Visit: Payer: Self-pay

## 2019-02-19 ENCOUNTER — Other Ambulatory Visit: Payer: Self-pay | Admitting: *Deleted

## 2019-02-19 NOTE — Patient Outreach (Signed)
Outreach call to pt for follow up, spoke with pt, HIPAA verified, pt reports she is doing well, driving, takes muscle relaxer and pain medication as needed for any pain, has occasional hurting in right side, pt states pain is managed.  RN CM discussed plan of care and will close case today, RN CM faxed case closure letter to primary MD and mailed case closure letter to pt home.  THN CM Care Plan Problem One     Most Recent Value  Care Plan Problem One  Recent surgery fusion of spine   Role Documenting the Problem One  Care Management Coordinator  Care Plan for Problem One  Active  THN Long Term Goal   Pt will recuperate successfully at home without complications within 31 days  THN Long Term Goal Start Date  01/21/19  [facility] Long Term Goal Met Date  02/19/19  Interventions for Problem One Long Term Goal  RN CM reviewed plan of care with pt and will discharge pt today  THN CM Short Term Goal #1   Pt will use safety precautions within 30 days  THN CM Short Term Goal #1 Start Date  01/21/19  [facility] CM Short Term Goal #1 Met Date  02/19/19  Interventions for Short Term Goal #1  RN CM reviewed safety precautions with pt.   Pt is wearing brace daily  THN CM Short Term Goal #2   Pt post op surgical incision will heal without complication wtihin 30 days  THN CM Short Term Goal #2 Start Date  01/21/19  [facility] CM Short Term Goal #2 Met Date  02/11/19      PLAN Close case today  Jacqlyn Larsen [facility], B[facility] [place] Coordinator [phone]

## 2019-03-23 DIAGNOSIS — Z9889 Other specified postprocedural states: Secondary | ICD-10-CM | POA: Diagnosis not present

## 2019-05-04 DIAGNOSIS — M545 Low back pain: Secondary | ICD-10-CM | POA: Diagnosis not present

## 2019-05-07 DIAGNOSIS — M4326 Fusion of spine, lumbar region: Secondary | ICD-10-CM | POA: Diagnosis not present

## 2019-05-25 DIAGNOSIS — M4326 Fusion of spine, lumbar region: Secondary | ICD-10-CM | POA: Diagnosis not present

## 2019-06-04 DIAGNOSIS — M4326 Fusion of spine, lumbar region: Secondary | ICD-10-CM | POA: Diagnosis not present

## 2019-06-30 DIAGNOSIS — M545 Low back pain: Secondary | ICD-10-CM | POA: Diagnosis not present

## 2019-07-12 ENCOUNTER — Other Ambulatory Visit: Payer: Self-pay | Admitting: Cardiovascular Disease

## 2019-07-12 DIAGNOSIS — Z1231 Encounter for screening mammogram for malignant neoplasm of breast: Secondary | ICD-10-CM

## 2019-07-19 DIAGNOSIS — M545 Low back pain: Secondary | ICD-10-CM | POA: Diagnosis not present

## 2019-07-19 DIAGNOSIS — E119 Type 2 diabetes mellitus without complications: Secondary | ICD-10-CM | POA: Diagnosis not present

## 2019-07-19 DIAGNOSIS — M5186 Other intervertebral disc disorders, lumbar region: Secondary | ICD-10-CM | POA: Diagnosis not present

## 2019-07-19 DIAGNOSIS — I10 Essential (primary) hypertension: Secondary | ICD-10-CM | POA: Diagnosis not present

## 2019-07-20 DIAGNOSIS — E7849 Other hyperlipidemia: Secondary | ICD-10-CM | POA: Diagnosis not present

## 2019-07-20 DIAGNOSIS — E1165 Type 2 diabetes mellitus with hyperglycemia: Secondary | ICD-10-CM | POA: Diagnosis not present

## 2019-07-20 DIAGNOSIS — E876 Hypokalemia: Secondary | ICD-10-CM | POA: Diagnosis not present

## 2019-07-20 DIAGNOSIS — E559 Vitamin D deficiency, unspecified: Secondary | ICD-10-CM | POA: Diagnosis not present

## 2019-07-20 DIAGNOSIS — D649 Anemia, unspecified: Secondary | ICD-10-CM | POA: Diagnosis not present

## 2019-07-20 DIAGNOSIS — Z79899 Other long term (current) drug therapy: Secondary | ICD-10-CM | POA: Diagnosis not present

## 2019-08-17 ENCOUNTER — Ambulatory Visit: Payer: PPO | Admitting: Sports Medicine

## 2019-08-17 ENCOUNTER — Other Ambulatory Visit: Payer: Self-pay

## 2019-08-17 ENCOUNTER — Encounter: Payer: Self-pay | Admitting: Sports Medicine

## 2019-08-17 DIAGNOSIS — B351 Tinea unguium: Secondary | ICD-10-CM

## 2019-08-17 DIAGNOSIS — M79674 Pain in right toe(s): Secondary | ICD-10-CM | POA: Diagnosis not present

## 2019-08-17 DIAGNOSIS — M79675 Pain in left toe(s): Secondary | ICD-10-CM

## 2019-08-17 DIAGNOSIS — E1142 Type 2 diabetes mellitus with diabetic polyneuropathy: Secondary | ICD-10-CM

## 2019-08-17 DIAGNOSIS — I739 Peripheral vascular disease, unspecified: Secondary | ICD-10-CM | POA: Diagnosis not present

## 2019-08-17 NOTE — Progress Notes (Signed)
Subjective: Felicia Shelton is a 78 y.o. female patient with history of diabetes who presents to office today complaining of long,mildly painful nails  while ambulating in shoes; unable to trim. FBS today 138.  Patient denies any new pedal complaints since last visit.  Patient Active Problem List   Diagnosis Date Noted  . Radiculopathy 12/31/2018  . Hypertension 08/04/2012  . Hx of adenomatous colonic polyp 05/26/2008   Current Outpatient Medications on File Prior to Visit  Medication Sig Dispense Refill  . acetaminophen (TYLENOL) 650 MG CR tablet Take 1,300 mg by mouth daily as needed for pain.    Marland Kitchen amLODipine (NORVASC) 5 MG tablet Take 5 mg by mouth daily.     Marland Kitchen atorvastatin (LIPITOR) 40 MG tablet Take 40 mg by mouth daily.    . Cholecalciferol (VITAMIN D-3) 5000 units TABS Take 5,000 Units by mouth every Monday, Wednesday, and Friday.     . diazepam (VALIUM) 5 MG tablet Take 1 tablet (5 mg total) by mouth every 6 (six) hours as needed for muscle spasms. 30 tablet 0  . fexofenadine-pseudoephedrine (ALLEGRA-D 24) 180-240 MG 24 hr tablet Take 1 tablet by mouth daily as needed (allergies.).    Marland Kitchen gabapentin (NEURONTIN) 300 MG capsule Take 600 mg by mouth every evening.    . hydrocortisone (ANUSOL-HC) 2.5 % rectal cream Place 1 application rectally 2 (two) times daily as needed for hemorrhoids or itching. 30 g 1  . JANUVIA 100 MG tablet Take 100 mg by mouth daily.   2  . Lidocaine (ASPERCREME LIDOCAINE) 4 % PTCH Place 1 patch onto the skin daily as needed (pain.).    Marland Kitchen lisinopril (PRINIVIL,ZESTRIL) 20 MG tablet Take 20 mg by mouth daily.    . methocarbamol (ROBAXIN) 500 MG tablet TAKE 1 TABLET BY MOUTH EVERY 6 TO 8 HOURS BY MOUTH AS NEEDED FOR SPASMS MUSCLE TENSION    . metoprolol tartrate (LOPRESSOR) 50 MG tablet Take 50 mg by mouth every evening.   0  . montelukast (SINGULAIR) 10 MG tablet Take 10 mg by mouth daily.    . Nerve Stimulator (TENS WIRED PAIN MANAGEMENT) DEVI 1 Dose by Does not  apply route See admin instructions. Use for up to 1.5 hours (in 30 minutes intervals) as needed for pain.    Marland Kitchen oxyCODONE-acetaminophen (PERCOCET/ROXICET) 5-325 MG tablet Take 1-2 tablets by mouth every 4 (four) hours as needed for moderate pain or severe pain. 30 tablet 0  . pantoprazole (PROTONIX) 40 MG tablet Take 40 mg by mouth every evening.   1   No current facility-administered medications on file prior to visit.    No Known Allergies  No results found for this or any previous visit (from the past 2160 hour(s)).  Objective: General: Patient is awake, alert, and oriented x 3 and in no acute distress.  Integument: Skin is warm, dry and supple bilateral. Nails are tender, long, thickened and dystrophic with subungual debris, consistent with onychomycosis, 1-5 bilateral with most discoloration and thickening at 1st toes. No signs of infection. No open lesions or preulcerative lesions present bilateral. Remaining integument unremarkable.  Vasculature:  Dorsalis Pedis pulse 1/4 bilateral. Posterior Tibial pulse  1/4 bilateral. Capillary fill time <3 sec 1-5 bilateral. Positive hair growth to the level of the digits.Temperature gradient within normal limits. No varicosities present bilateral. No edema present bilateral.   Neurology: The patient has intact sensation measured with a 5.07/10g Semmes Weinstein Monofilament at all pedal sites bilateral. Vibratory sensation diminished bilateral with tuning  fork. No Babinski sign present bilateral.   Musculoskeletal: Asymptomatic hammertoe pedal deformities noted bilateral. Muscular strength 5/5 in all lower extremity muscular groups bilateral without pain on range of motion . No tenderness with calf compression bilateral.  Assessment and Plan: Problem List Items Addressed This Visit    None    Visit Diagnoses    Pain due to onychomycosis of toenails of both feet    -  Primary   Diabetic polyneuropathy associated with type 2 diabetes mellitus  (HCC)       Relevant Medications   methocarbamol (ROBAXIN) 500 MG tablet   PAD (peripheral artery disease) ([place])          -Examined patient. -Re-discussed and educated patient on diabetic foot care, especially with  regards to the vascular, neurological and musculoskeletal systems. . -Mechanically debrided all nails 1-5 bilateral using sterile nail nipper and filed with dremel without incident  -Patient to return  in 3 months for at risk foot care -Patient advised to call the office if any problems or questions arise in the meantime.  Landis Martins, DPM

## 2019-08-19 DIAGNOSIS — I361 Nonrheumatic tricuspid (valve) insufficiency: Secondary | ICD-10-CM | POA: Diagnosis not present

## 2019-08-19 DIAGNOSIS — I340 Nonrheumatic mitral (valve) insufficiency: Secondary | ICD-10-CM | POA: Diagnosis not present

## 2019-08-25 ENCOUNTER — Other Ambulatory Visit: Payer: Self-pay

## 2019-08-25 ENCOUNTER — Ambulatory Visit
Admission: RE | Admit: 2019-08-25 | Discharge: 2019-08-25 | Disposition: A | Discharge status: Home / Self Care | Payer: PPO | Source: Ambulatory Visit | Attending: Cardiovascular Disease | Admitting: Cardiovascular Disease

## 2019-08-25 DIAGNOSIS — Z1231 Encounter for screening mammogram for malignant neoplasm of breast: Secondary | ICD-10-CM | POA: Diagnosis not present

## 2019-10-05 ENCOUNTER — Other Ambulatory Visit: Payer: Self-pay | Admitting: *Deleted

## 2019-10-29 DIAGNOSIS — M5416 Radiculopathy, lumbar region: Secondary | ICD-10-CM | POA: Diagnosis not present

## 2019-11-23 ENCOUNTER — Encounter: Payer: Self-pay | Admitting: Sports Medicine

## 2019-11-23 ENCOUNTER — Other Ambulatory Visit: Payer: Self-pay

## 2019-11-23 ENCOUNTER — Ambulatory Visit: Payer: PPO | Admitting: Sports Medicine

## 2019-11-23 DIAGNOSIS — M79675 Pain in left toe(s): Secondary | ICD-10-CM

## 2019-11-23 DIAGNOSIS — E1142 Type 2 diabetes mellitus with diabetic polyneuropathy: Secondary | ICD-10-CM | POA: Diagnosis not present

## 2019-11-23 DIAGNOSIS — M79674 Pain in right toe(s): Secondary | ICD-10-CM

## 2019-11-23 DIAGNOSIS — B351 Tinea unguium: Secondary | ICD-10-CM

## 2019-11-23 NOTE — Progress Notes (Signed)
Subjective: Felicia Shelton is a 79 y.o. female patient with history of diabetes who presents to office today complaining of long,mildly painful nails  while ambulating in shoes; unable to trim. FBS today 127.  Patient denies any new pedal complaints since last visit.  Patient Active Problem List   Diagnosis Date Noted  . Radiculopathy 12/31/2018  . Hypertension 08/04/2012  . Hx of adenomatous colonic polyp 05/26/2008   Current Outpatient Medications on File Prior to Visit  Medication Sig Dispense Refill  . acetaminophen (TYLENOL) 650 MG CR tablet Take 1,300 mg by mouth daily as needed for pain.    Marland Kitchen amLODipine (NORVASC) 5 MG tablet Take 5 mg by mouth daily.     Marland Kitchen atorvastatin (LIPITOR) 40 MG tablet Take 40 mg by mouth daily.    . Cholecalciferol (VITAMIN D-3) 5000 units TABS Take 5,000 Units by mouth every Monday, Wednesday, and Friday.     . diazepam (VALIUM) 5 MG tablet Take 1 tablet (5 mg total) by mouth every 6 (six) hours as needed for muscle spasms. 30 tablet 0  . fexofenadine-pseudoephedrine (ALLEGRA-D 24) 180-240 MG 24 hr tablet Take 1 tablet by mouth daily as needed (allergies.).    Marland Kitchen gabapentin (NEURONTIN) 300 MG capsule Take 600 mg by mouth every evening.    . hydrocortisone (ANUSOL-HC) 2.5 % rectal cream Place 1 application rectally 2 (two) times daily as needed for hemorrhoids or itching. 30 g 1  . JANUVIA 100 MG tablet Take 100 mg by mouth daily.   2  . Lidocaine (ASPERCREME LIDOCAINE) 4 % PTCH Place 1 patch onto the skin daily as needed (pain.).    Marland Kitchen lisinopril (PRINIVIL,ZESTRIL) 20 MG tablet Take 20 mg by mouth daily.    . methocarbamol (ROBAXIN) 500 MG tablet TAKE 1 TABLET BY MOUTH EVERY 6 TO 8 HOURS BY MOUTH AS NEEDED FOR SPASMS MUSCLE TENSION    . metoprolol tartrate (LOPRESSOR) 50 MG tablet Take 50 mg by mouth every evening.   0  . montelukast (SINGULAIR) 10 MG tablet Take 10 mg by mouth daily.    . Nerve Stimulator (TENS WIRED PAIN MANAGEMENT) DEVI 1 Dose by Does not  apply route See admin instructions. Use for up to 1.5 hours (in 30 minutes intervals) as needed for pain.    Marland Kitchen oxyCODONE-acetaminophen (PERCOCET/ROXICET) 5-325 MG tablet Take 1-2 tablets by mouth every 4 (four) hours as needed for moderate pain or severe pain. 30 tablet 0  . pantoprazole (PROTONIX) 40 MG tablet Take 40 mg by mouth every evening.   1  . traMADol (ULTRAM) 50 MG tablet Take 50 mg by mouth every 8 (eight) hours as needed.     No current facility-administered medications on file prior to visit.   No Known Allergies  No results found for this or any previous visit (from the past 2160 hour(s)).  Objective: General: Patient is awake, alert, and oriented x 3 and in no acute distress.  Integument: Skin is warm, dry and supple bilateral. Nails are tender, long, thickened and dystrophic with subungual debris, consistent with onychomycosis, 1-5 bilateral with most discoloration and thickening at 1st toes. No signs of infection. No open lesions or preulcerative lesions present bilateral. Remaining integument unremarkable.  Vasculature:  Dorsalis Pedis pulse 1/4 bilateral. Posterior Tibial pulse  1/4 bilateral. Capillary fill time <3 sec 1-5 bilateral. Positive hair growth to the level of the digits.Temperature gradient within normal limits. No varicosities present bilateral. No edema present bilateral.   Neurology: The patient has intact  sensation measured with a 5.07/10g Semmes Weinstein Monofilament at all pedal sites bilateral. Vibratory sensation diminished bilateral with tuning fork. No Babinski sign present bilateral.   Musculoskeletal: Asymptomatic hammertoe pedal deformities noted bilateral. Muscular strength 5/5 in all lower extremity muscular groups bilateral without pain on range of motion . No tenderness with calf compression bilateral.  Assessment and Plan: Problem List Items Addressed This Visit    None    Visit Diagnoses    Pain due to onychomycosis of toenails of both  feet    -  Primary   Diabetic polyneuropathy associated with type 2 diabetes mellitus ([place])          -Examined patient. -Discussed and educated patient on diabetic foot care, especially with  regards to the vascular, neurological and musculoskeletal systems. . -Mechanically debrided all nails 1-5 bilateral using sterile nail nipper and filed with dremel without incident  -Patient to return  in 3 months for at risk foot care -Patient advised to call the office if any problems or questions arise in the meantime.  Landis Martins, DPM

## 2019-12-28 DIAGNOSIS — H2513 Age-related nuclear cataract, bilateral: Secondary | ICD-10-CM | POA: Diagnosis not present

## 2019-12-28 DIAGNOSIS — H26491 Other secondary cataract, right eye: Secondary | ICD-10-CM | POA: Diagnosis not present

## 2019-12-28 DIAGNOSIS — H43812 Vitreous degeneration, left eye: Secondary | ICD-10-CM | POA: Diagnosis not present

## 2020-01-21 DIAGNOSIS — H2513 Age-related nuclear cataract, bilateral: Secondary | ICD-10-CM | POA: Diagnosis not present

## 2020-01-21 DIAGNOSIS — H25041 Posterior subcapsular polar age-related cataract, right eye: Secondary | ICD-10-CM | POA: Diagnosis not present

## 2020-01-21 DIAGNOSIS — H25013 Cortical age-related cataract, bilateral: Secondary | ICD-10-CM | POA: Diagnosis not present

## 2020-02-17 HISTORY — PX: CATARACT EXTRACTION: SUR2

## 2020-02-19 DIAGNOSIS — M25571 Pain in right ankle and joints of right foot: Secondary | ICD-10-CM | POA: Diagnosis not present

## 2020-02-22 DIAGNOSIS — H25811 Combined forms of age-related cataract, right eye: Secondary | ICD-10-CM | POA: Diagnosis not present

## 2020-02-22 DIAGNOSIS — H2511 Age-related nuclear cataract, right eye: Secondary | ICD-10-CM | POA: Diagnosis not present

## 2020-02-22 DIAGNOSIS — H25011 Cortical age-related cataract, right eye: Secondary | ICD-10-CM | POA: Diagnosis not present

## 2020-02-22 DIAGNOSIS — H25041 Posterior subcapsular polar age-related cataract, right eye: Secondary | ICD-10-CM | POA: Diagnosis not present

## 2020-02-23 ENCOUNTER — Ambulatory Visit: Payer: PPO | Admitting: Podiatry

## 2020-02-28 ENCOUNTER — Encounter: Payer: Self-pay | Admitting: Internal Medicine

## 2020-04-03 ENCOUNTER — Ambulatory Visit (AMBULATORY_SURGERY_CENTER): Payer: Self-pay

## 2020-04-03 ENCOUNTER — Other Ambulatory Visit: Payer: Self-pay

## 2020-04-03 VITALS — Temp 96.0°F | Ht 66.0 in | Wt 181.0 lb

## 2020-04-03 DIAGNOSIS — Z86010 Personal history of colonic polyps: Secondary | ICD-10-CM

## 2020-04-03 NOTE — Progress Notes (Signed)
No egg or soy allergy known to patient  No issues with past sedation with any surgeries  or procedures, no intubation problems  No diet pills per patient No home 02 use per patient  No blood thinners per patient  Pt denies issues with constipation  No A fib or A flutter  EMMI video sent to pt's e mail   Pt has completed covid vaccine series 12/27/19  Due to the COVID-19 pandemic we are asking patients to follow these guidelines. Please only bring one care partner. Please be aware that your care partner may wait in the car in the parking lot or if they feel like they will be too hot to wait in the car, they may wait in the lobby on the 4th floor. All care partners are required to wear a mask the entire time (we do not have any that we can provide them), they need to practice social distancing, and we will do a Covid check for all patient's and care partners when you arrive. Also we will check their temperature and your temperature. If the care partner waits in their car they need to stay in the parking lot the entire time and we will call them on their cell phone when the patient is ready for discharge so they can bring the car to the front of the building. Also all patient's will need to wear a mask into building.

## 2020-04-11 DIAGNOSIS — E119 Type 2 diabetes mellitus without complications: Secondary | ICD-10-CM | POA: Diagnosis not present

## 2020-04-11 DIAGNOSIS — M25571 Pain in right ankle and joints of right foot: Secondary | ICD-10-CM | POA: Diagnosis not present

## 2020-04-11 DIAGNOSIS — I10 Essential (primary) hypertension: Secondary | ICD-10-CM | POA: Diagnosis not present

## 2020-04-11 DIAGNOSIS — I2510 Atherosclerotic heart disease of native coronary artery without angina pectoris: Secondary | ICD-10-CM | POA: Diagnosis not present

## 2020-04-18 ENCOUNTER — Ambulatory Visit (AMBULATORY_SURGERY_CENTER): Payer: PPO | Admitting: Internal Medicine

## 2020-04-18 ENCOUNTER — Encounter: Payer: Self-pay | Admitting: Internal Medicine

## 2020-04-18 ENCOUNTER — Other Ambulatory Visit: Payer: Self-pay

## 2020-04-18 ENCOUNTER — Other Ambulatory Visit: Payer: Self-pay | Admitting: Internal Medicine

## 2020-04-18 VITALS — BP 148/74 | HR 81 | Temp 95.9°F | Resp 21 | Ht 66.0 in | Wt 181.0 lb

## 2020-04-18 DIAGNOSIS — D125 Benign neoplasm of sigmoid colon: Secondary | ICD-10-CM | POA: Diagnosis not present

## 2020-04-18 DIAGNOSIS — D124 Benign neoplasm of descending colon: Secondary | ICD-10-CM

## 2020-04-18 DIAGNOSIS — Z86010 Personal history of colonic polyps: Secondary | ICD-10-CM | POA: Diagnosis not present

## 2020-04-18 DIAGNOSIS — I10 Essential (primary) hypertension: Secondary | ICD-10-CM | POA: Diagnosis not present

## 2020-04-18 DIAGNOSIS — E119 Type 2 diabetes mellitus without complications: Secondary | ICD-10-CM | POA: Diagnosis not present

## 2020-04-18 MED ORDER — HYDROCORTISONE (PERIANAL) 2.5 % EX CREA: 1.0000 "application " | TOPICAL_CREAM | Freq: Two times a day (BID) | CUTANEOUS | 0 refills | Status: DC

## 2020-04-18 MED ORDER — SODIUM CHLORIDE 0.9 % IV SOLN: 500.0000 mL | Freq: Once | INTRAVENOUS | Status: DC

## 2020-04-18 NOTE — Progress Notes (Signed)
No problems noted in the recovery room. Maw  MAW

## 2020-04-18 NOTE — Progress Notes (Signed)
Temp check by:LC Vital check by:DT  The patient states no changes in medical or surgical history since pre-visit screening on 04/03/20.

## 2020-04-18 NOTE — Op Note (Signed)
[facility] Patient Name: Felicia Shelton Procedure Date: 04/18/2020 9:07 AM MRN: [id number] Endoscopist: Gatha Mayer , MD Age: 79 Referring MD:  Date of Birth: [birth year] Gender: Female Account #: [id number] Procedure:                Colonoscopy Indications:              Surveillance: Personal history of adenomatous                            polyps on last colonoscopy 5 years ago Medicines:                Propofol per Anesthesia, Monitored Anesthesia Care Procedure:                Pre-Anesthesia Assessment:                           - Prior to the procedure, a History and Physical                            was performed, and patient medications and                            allergies were reviewed. The patient's tolerance of                            previous anesthesia was also reviewed. The risks                            and benefits of the procedure and the sedation                            options and risks were discussed with the patient.                            All questions were answered, and informed consent                            was obtained. Prior Anticoagulants: The patient has                            taken no previous anticoagulant or antiplatelet                            agents. ASA Grade Assessment: II - A patient with                            mild systemic disease. After reviewing the risks                            and benefits, the patient was deemed in                            satisfactory condition to undergo the procedure.  After obtaining informed consent, the colonoscope                            was passed under direct vision. Throughout the                            procedure, the patient's blood pressure, pulse, and                            oxygen saturations were monitored continuously. The                            Colonoscope was introduced through the anus and   advanced to the the cecum, identified by                            appendiceal orifice and ileocecal valve. The                            colonoscopy was performed without difficulty. The                            patient tolerated the procedure well. The quality                            of the bowel preparation was excellent. The                            ileocecal valve, appendiceal orifice, and rectum                            were photographed. Scope In: 9:24:11 AM Scope Out: 9:41:54 AM Scope Withdrawal Time: 0 hours 15 minutes 35 seconds  Total Procedure Duration: 0 hours 17 minutes 43 seconds  Findings:                 The perianal and digital rectal examinations were                            normal.                           Seven sessile polyps were found in the sigmoid                            colon and descending colon. The polyps were                            diminutive in size. These polyps were removed with                            a cold snare. Resection and retrieval were                            complete. Verification of patient identification  for the specimen was done. Estimated blood loss was                            minimal.                           The exam was otherwise without abnormality on                            direct and retroflexion views. Complications:            No immediate complications. Estimated Blood Loss:     Estimated blood loss was minimal. Impression:               - Seven diminutive polyps in the sigmoid colon and                            in the descending colon, removed with a cold snare.                            Resected and retrieved.                           - The examination was otherwise normal on direct                            and retroflexion views.                           - Personal history of colonic polyps.                           -05/2008 15 mm sessile adenoma                            2010 no polyps                           2013 - 2 8-10 mm cecal adenomas ([facility])                           10/11/2015 - no polyps - Recommendation:           - Patient has a contact number available for                            emergencies. The signs and symptoms of potential                            delayed complications were discussed with the                            patient. Return to normal activities tomorrow.                            Written discharge instructions were provided to the  patient.                           - Resume previous diet.                           - Continue present medications.                           - No recommendation at this time regarding repeat                            colonoscopy. Gatha Mayer, MD 04/18/2020 9:51:28 AM This report has been signed electronically.

## 2020-04-18 NOTE — Progress Notes (Signed)
Report given to PACU, vss 

## 2020-04-18 NOTE — Patient Instructions (Addendum)
I found and removed 7 tiny polyps. Let's see what they were and I will let you know results and plamns.  I appreciate the opportunity to care for you. Gatha Mayer, MD, FACG   YOU HAD AN ENDOSCOPIC PROCEDURE TODAY AT [facility]:   Refer to the procedure report that was given to you for any specific questions about what was found during the examination.  If the procedure report does not answer your questions, please call your gastroenterologist to clarify.  If you requested that your care partner not be given the details of your procedure findings, then the procedure report has been included in a sealed envelope for you to review at your convenience later.  YOU SHOULD EXPECT: Some feelings of bloating in the abdomen. Passage of more gas than usual.  Walking can help get rid of the air that was put into your GI tract during the procedure and reduce the bloating. If you had a lower endoscopy (such as a colonoscopy or flexible sigmoidoscopy) you may notice spotting of blood in your stool or on the toilet paper. If you underwent a bowel prep for your procedure, you may not have a normal bowel movement for a few days.  Please Note:  You might notice some irritation and congestion in your nose or some drainage.  This is from the oxygen used during your procedure.  There is no need for concern and it should clear up in a day or so.  SYMPTOMS TO REPORT IMMEDIATELY:   Following lower endoscopy (colonoscopy or flexible sigmoidoscopy):  Excessive amounts of blood in the stool  Significant tenderness or worsening of abdominal pains  Swelling of the abdomen that is new, acute  Fever of 100F or higher   For urgent or emergent issues, a gastroenterologist can be reached at any hour by calling [phone]. Do not use MyChart messaging for urgent concerns.    DIET:  We do recommend a small meal at first, but then you may proceed to your regular diet.  Drink plenty of fluids but you  should avoid alcoholic beverages for 24 hours.  ACTIVITY:  You should plan to take it easy for the rest of today and you should NOT DRIVE or use heavy machinery until tomorrow (because of the sedation medicines used during the test).    FOLLOW UP: Our staff will call the number listed on your records 48-72 hours following your procedure to check on you and address any questions or concerns that you may have regarding the information given to you following your procedure. If we do not reach you, we will leave a message.  We will attempt to reach you two times.  During this call, we will ask if you have developed any symptoms of COVID 19. If you develop any symptoms (ie: fever, flu-like symptoms, shortness of breath, cough etc.) before then, please call [phone].  If you test positive for Covid 19 in the 2 weeks post procedure, please call and report this information to [place].    If any biopsies were taken you will be contacted by phone or by letter within the next 1-3 weeks.  Please call us at [phone] if you have not heard about the biopsies in 3 weeks.    SIGNATURES/CONFIDENTIALITY: You and/or your care partner have signed paperwork which will be entered into your electronic medical record.  These signatures attest to the fact that that the information above on your After Visit Summary has been reviewed  and is understood.  Full responsibility of the confidentiality of this discharge information lies with you and/or your care-partner.    Handout was  given to you on polyps. Your blood sugar was 117 in the recovery room. You may resume your current medications today. Await biopsy results. Please call if any questions or concerns.

## 2020-04-18 NOTE — Progress Notes (Signed)
Called to room to assist during endoscopic procedure.  Patient ID and intended procedure confirmed with present staff. Received instructions for my participation in the procedure from the performing physician.  

## 2020-04-20 ENCOUNTER — Telehealth: Payer: Self-pay

## 2020-04-20 ENCOUNTER — Telehealth: Payer: Self-pay | Admitting: *Deleted

## 2020-04-20 NOTE — Telephone Encounter (Signed)
°  Follow up Call-  Call back number 04/18/2020  Post procedure Call Back phone  # [phone]  Permission to leave phone message Yes  Some recent data might be hidden     Patient questions:  Do you have a fever, pain , or abdominal swelling? No. Pain Score  0 *  Have you tolerated food without any problems? Yes.    Have you been able to return to your normal activities? Yes.    Do you have any questions about your discharge instructions: Diet   No. Medications  No. Follow up visit  No.  Do you have questions or concerns about your Care? No.  Actions: * If pain score is 4 or above: No action needed, pain <4.  1. Have you developed a fever since your procedure? no  2.   Have you had an respiratory symptoms (SOB or cough) since your procedure? no  3.   Have you tested positive for COVID 19 since your procedure no  4.   Have you had any family members/close contacts diagnosed with the COVID 19 since your procedure?  no   If yes to any of these questions please route to Joylene John, RN and Erenest Rasher, RN

## 2020-04-20 NOTE — Telephone Encounter (Signed)
Message left

## 2020-04-25 ENCOUNTER — Encounter: Payer: Self-pay | Admitting: Internal Medicine

## 2020-04-25 DIAGNOSIS — Z86010 Personal history of colonic polyps: Secondary | ICD-10-CM

## 2020-04-26 ENCOUNTER — Encounter: Payer: Self-pay | Admitting: Podiatry

## 2020-04-26 ENCOUNTER — Ambulatory Visit: Payer: PPO | Admitting: Podiatry

## 2020-04-26 ENCOUNTER — Other Ambulatory Visit: Payer: Self-pay

## 2020-04-26 DIAGNOSIS — M79675 Pain in left toe(s): Secondary | ICD-10-CM

## 2020-04-26 DIAGNOSIS — M2042 Other hammer toe(s) (acquired), left foot: Secondary | ICD-10-CM

## 2020-04-26 DIAGNOSIS — E1142 Type 2 diabetes mellitus with diabetic polyneuropathy: Secondary | ICD-10-CM

## 2020-04-26 DIAGNOSIS — M2041 Other hammer toe(s) (acquired), right foot: Secondary | ICD-10-CM

## 2020-04-26 DIAGNOSIS — B351 Tinea unguium: Secondary | ICD-10-CM | POA: Diagnosis not present

## 2020-04-26 DIAGNOSIS — M79674 Pain in right toe(s): Secondary | ICD-10-CM

## 2020-04-26 NOTE — Patient Instructions (Signed)
Diabetes Mellitus and Foot Care Foot care is an important part of your health, especially when you have diabetes. Diabetes may cause you to have problems because of poor blood flow (circulation) to your feet and legs, which can cause your skin to:  Become thinner and drier.  Break more easily.  Heal more slowly.  Peel and crack. You may also have nerve damage (neuropathy) in your legs and feet, causing decreased feeling in them. This means that you may not notice minor injuries to your feet that could lead to more serious problems. Noticing and addressing any potential problems early is the best way to prevent future foot problems. How to care for your feet Foot hygiene  Wash your feet daily with warm water and mild soap. Do not use hot water. Then, pat your feet and the areas between your toes until they are completely dry. Do not soak your feet as this can dry your skin.  Trim your toenails straight across. Do not dig under them or around the cuticle. File the edges of your nails with an emery board or nail file.  Apply a moisturizing lotion or petroleum jelly to the skin on your feet and to dry, brittle toenails. Use lotion that does not contain alcohol and is unscented. Do not apply lotion between your toes. Shoes and socks  Wear clean socks or stockings every day. Make sure they are not too tight. Do not wear knee-high stockings since they may decrease blood flow to your legs.  Wear shoes that fit properly and have enough cushioning. Always look in your shoes before you put them on to be sure there are no objects inside.  To break in new shoes, wear them for just a few hours a day. This prevents injuries on your feet. Wounds, scrapes, corns, and calluses  Check your feet daily for blisters, cuts, bruises, sores, and redness. If you cannot see the bottom of your feet, use a mirror or ask someone for help.  Do not cut corns or calluses or try to remove them with medicine.  If you  find a minor scrape, cut, or break in the skin on your feet, keep it and the skin around it clean and dry. You may clean these areas with mild soap and water. Do not clean the area with peroxide, alcohol, or iodine.  If you have a wound, scrape, corn, or callus on your foot, look at it several times a day to make sure it is healing and not infected. Check for: ? Redness, swelling, or pain. ? Fluid or blood. ? Warmth. ? Pus or a bad smell. General instructions  Do not cross your legs. This may decrease blood flow to your feet.  Do not use heating pads or hot water bottles on your feet. They may burn your skin. If you have lost feeling in your feet or legs, you may not know this is happening until it is too late.  Protect your feet from hot and cold by wearing shoes, such as at the beach or on hot pavement.  Schedule a complete foot exam at least once a year (annually) or more often if you have foot problems. If you have foot problems, report any cuts, sores, or bruises to your health care provider immediately. Contact a health care provider if:  You have a medical condition that increases your risk of infection and you have any cuts, sores, or bruises on your feet.  You have an injury that is not   healing.  You have redness on your legs or feet.  You feel burning or tingling in your legs or feet.  You have pain or cramps in your legs and feet.  Your legs or feet are numb.  Your feet always feel cold.  You have pain around a toenail. Get help right away if:  You have a wound, scrape, corn, or callus on your foot and: ? You have pain, swelling, or redness that gets worse. ? You have fluid or blood coming from the wound, scrape, corn, or callus. ? Your wound, scrape, corn, or callus feels warm to the touch. ? You have pus or a bad smell coming from the wound, scrape, corn, or callus. ? You have a fever. ? You have a red line going up your leg. Summary  Check your feet every day  for cuts, sores, red spots, swelling, and blisters.  Moisturize feet and legs daily.  Wear shoes that fit properly and have enough cushioning.  If you have foot problems, report any cuts, sores, or bruises to your health care provider immediately.  Schedule a complete foot exam at least once a year (annually) or more often if you have foot problems. This information is not intended to replace advice given to you by your health care provider. Make sure you discuss any questions you have with your health care provider. Document Revised: 07/28/2019 Document Reviewed: 12/06/2016 Elsevier Patient Education  2020 [employer]  

## 2020-04-28 NOTE — Progress Notes (Signed)
Subjective: Felicia Shelton is a pleasant 79 y.o. female patient seen today at risk foot care with history of diabetic neuropathy and painful mycotic nails b/l that are difficult to trim. Pain interferes with ambulation. Aggravating factors include wearing enclosed shoe gear. Pain is relieved with periodic professional debridement.   She relates she sprained her right ankle on February 17, 2020, and was treated at [facility]. Patient states she still has some swelling and pain around Achilles. She is using a Voltaren Gel, ice and a TENS unit.  Patient Active Problem List   Diagnosis Date Noted  . Radiculopathy 12/31/2018  . Hypertension 08/04/2012  . Hx of adenomatous colonic polyp 05/26/2008    Current Outpatient Medications on File Prior to Visit  Medication Sig Dispense Refill  . acetaminophen (TYLENOL) 650 MG CR tablet Take 1,300 mg by mouth daily as needed for pain.    Marland Kitchen amLODipine (NORVASC) 5 MG tablet Take 5 mg by mouth daily.     Marland Kitchen atorvastatin (LIPITOR) 40 MG tablet Take 40 mg by mouth daily.    . Cholecalciferol (VITAMIN D-3) 5000 units TABS Take 5,000 Units by mouth every Monday, Wednesday, and Friday.     . diazepam (VALIUM) 5 MG tablet Take 1 tablet (5 mg total) by mouth every 6 (six) hours as needed for muscle spasms. 30 tablet 0  . fexofenadine-pseudoephedrine (ALLEGRA-D 24) 180-240 MG 24 hr tablet Take 1 tablet by mouth daily as needed (allergies.).    Marland Kitchen gabapentin (NEURONTIN) 300 MG capsule Take 600 mg by mouth every evening.    . hydrocortisone (PROCTOZONE-HC) 2.5 % rectal cream Place 1 application rectally 2 (two) times daily. 30 g 0  . JANUVIA 100 MG tablet Take 100 mg by mouth daily.   2  . Lidocaine (ASPERCREME LIDOCAINE) 4 % PTCH Place 1 patch onto the skin daily as needed (pain.).    Marland Kitchen lisinopril (PRINIVIL,ZESTRIL) 20 MG tablet Take 20 mg by mouth daily.    . methocarbamol (ROBAXIN) 500 MG tablet TAKE 1 TABLET BY MOUTH EVERY 6 TO 8 HOURS BY MOUTH AS NEEDED FOR SPASMS  MUSCLE TENSION    . metoprolol tartrate (LOPRESSOR) 50 MG tablet Take 50 mg by mouth every evening.   0  . pantoprazole (PROTONIX) 40 MG tablet Take 40 mg by mouth every evening.   1   No current facility-administered medications on file prior to visit.    No Known Allergies  Objective: Physical Exam  General: Felicia Shelton is a pleasant 79 y.o. African American female, in NAD. AAO x 3.   Vascular:  Capillary fill time to digits <3 seconds b/l lower extremities. Faintly palpable pedal pulses b/l. Pedal hair present b/l. Skin temperature gradient within normal limits b/l.  Dermatological:  Pedal skin with normal turgor, texture and tone bilaterally. No open wounds bilaterally. No interdigital macerations bilaterally. Toenails 1-5 b/l elongated, discolored, dystrophic, thickened, crumbly with subungual debris and tenderness to dorsal palpation.  Musculoskeletal:  Normal muscle strength 5/5 to all lower extremity muscle groups bilaterally. No pain crepitus or joint limitation noted with ROM b/l. Hallux valgus with bunion deformity noted b/l lower extremities. Hammertoes noted to the b/l lower extremities and 2-5 bilaterally.   Neurological:  Protective sensation intact 5/5 intact bilaterally with 10g monofilament b/l. Vibratory sensation diminished b/l. Proprioception intact bilaterally.  Assessment and Plan:  1. Pain due to onychomycosis of toenails of both feet   2. Acquired hammertoes of both feet   3. Diabetic polyneuropathy associated with type  2 diabetes mellitus ([place])    -Examined patient. -Continue diabetic foot care principles. -She is under care of [facility] for right ankle sprain. Explained to her it takes a while for soft tissue to heal.  -Toenails 1-5 b/l were debrided in length and girth with sterile nail nippers and dremel without iatrogenic bleeding.  -Patient to report any pedal injuries to medical professional immediately. -Patient to continue soft,  supportive shoe gear daily. -Patient/POA to call should there be question/concern in the interim.  Return in about 3 months (around 07/27/2020) for diabetic nail and callus trim.  Marzetta Board, DPM

## 2020-07-19 DIAGNOSIS — H10413 Chronic giant papillary conjunctivitis, bilateral: Secondary | ICD-10-CM | POA: Diagnosis not present

## 2020-07-31 DIAGNOSIS — M25511 Pain in right shoulder: Secondary | ICD-10-CM | POA: Diagnosis not present

## 2020-08-07 ENCOUNTER — Other Ambulatory Visit: Payer: Self-pay | Admitting: Cardiovascular Disease

## 2020-08-07 DIAGNOSIS — Z1231 Encounter for screening mammogram for malignant neoplasm of breast: Secondary | ICD-10-CM

## 2020-08-09 ENCOUNTER — Other Ambulatory Visit: Payer: Self-pay

## 2020-08-09 ENCOUNTER — Encounter: Payer: Self-pay | Admitting: Podiatry

## 2020-08-09 ENCOUNTER — Ambulatory Visit: Payer: PPO | Admitting: Podiatry

## 2020-08-09 DIAGNOSIS — M79675 Pain in left toe(s): Secondary | ICD-10-CM

## 2020-08-09 DIAGNOSIS — M2041 Other hammer toe(s) (acquired), right foot: Secondary | ICD-10-CM

## 2020-08-09 DIAGNOSIS — B351 Tinea unguium: Secondary | ICD-10-CM | POA: Diagnosis not present

## 2020-08-09 DIAGNOSIS — E1142 Type 2 diabetes mellitus with diabetic polyneuropathy: Secondary | ICD-10-CM | POA: Diagnosis not present

## 2020-08-09 DIAGNOSIS — M79674 Pain in right toe(s): Secondary | ICD-10-CM | POA: Diagnosis not present

## 2020-08-09 DIAGNOSIS — M2042 Other hammer toe(s) (acquired), left foot: Secondary | ICD-10-CM

## 2020-08-11 DIAGNOSIS — Z79899 Other long term (current) drug therapy: Secondary | ICD-10-CM | POA: Diagnosis not present

## 2020-08-11 DIAGNOSIS — D649 Anemia, unspecified: Secondary | ICD-10-CM | POA: Diagnosis not present

## 2020-08-11 DIAGNOSIS — E7849 Other hyperlipidemia: Secondary | ICD-10-CM | POA: Diagnosis not present

## 2020-08-11 DIAGNOSIS — E876 Hypokalemia: Secondary | ICD-10-CM | POA: Diagnosis not present

## 2020-08-11 DIAGNOSIS — E1165 Type 2 diabetes mellitus with hyperglycemia: Secondary | ICD-10-CM | POA: Diagnosis not present

## 2020-08-13 NOTE — Progress Notes (Signed)
Subjective:  Patient ID: Felicia Shelton, female    DOB: Mar 26, 1941,  MRN: [id number]  79 y.o. female presents with at risk foot care with history of diabetic neuropathy and painful thick toenails that are difficult to trim. Pain interferes with ambulation. Aggravating factors include wearing enclosed shoe gear. Pain is relieved with periodic professional debridement..    Review of Systems: Negative except as noted in the HPI.  Past Medical History:  Diagnosis Date  . Allergy   . Arthritis    back  . Diabetes mellitus without complication ([place])   . Hx of adenomatous colonic polyp 05/26/2008  . Hyperlipidemia   . Hypertension   . Neuromuscular disorder [facility])    Past Surgical History:  Procedure Laterality Date  . ABDOMINAL HYSTERECTOMY    . APPENDECTOMY    . BREAST EXCISIONAL BIOPSY Left   . BREAST SURGERY     left breast  . CATARACT EXTRACTION Right 02/2020  . COLONOSCOPY  10/11/2015   polyps 2009  . TONSILLECTOMY     removed age 75-5  . TRANSFORAMINAL LUMBAR INTERBODY FUSION (TLIF) WITH PEDICLE SCREW FIXATION 2 LEVEL Right 12/31/2018   Procedure: RIGHT - SIDED LUMBAR 2-3,LUMBAR 3-4, LUMBAR 4-5 TRANSFORAMINAL LUMBAR INTERBODY FUSION WITH INSTRUMENTATION AND ALLOGRAFT;  Surgeon: Phylliss Bob, MD;  Location: [facility];  Service: Orthopedics;  Laterality: Right;   Patient Active Problem List   Diagnosis Date Noted  . Radiculopathy 12/31/2018  . Hypertension 08/04/2012  . Hx of adenomatous colonic polyp 05/26/2008    Current Outpatient Medications:  .  acetaminophen (TYLENOL) 650 MG CR tablet, Take 1,300 mg by mouth daily as needed for pain., Disp: , Rfl:  .  amLODipine (NORVASC) 5 MG tablet, Take 5 mg by mouth daily. , Disp: , Rfl:  .  atorvastatin (LIPITOR) 40 MG tablet, Take 40 mg by mouth daily., Disp: , Rfl:  .  Cholecalciferol (VITAMIN D-3) 5000 units TABS, Take 5,000 Units by mouth every Monday, Wednesday, and Friday. , Disp: , Rfl:  .  diazepam (VALIUM) 5 MG tablet, Take 1  tablet (5 mg total) by mouth every 6 (six) hours as needed for muscle spasms., Disp: 30 tablet, Rfl: 0 .  fexofenadine-pseudoephedrine (ALLEGRA-D 24) 180-240 MG 24 hr tablet, Take 1 tablet by mouth daily as needed (allergies.)., Disp: , Rfl:  .  gabapentin (NEURONTIN) 300 MG capsule, Take 600 mg by mouth every evening., Disp: , Rfl:  .  hydrocortisone (PROCTOZONE-HC) 2.5 % rectal cream, Place 1 application rectally 2 (two) times daily., Disp: 30 g, Rfl: 0 .  JANUVIA 100 MG tablet, Take 100 mg by mouth daily. , Disp: , Rfl: 2 .  Lidocaine (ASPERCREME LIDOCAINE) 4 % PTCH, Place 1 patch onto the skin daily as needed (pain.)., Disp: , Rfl:  .  lisinopril (PRINIVIL,ZESTRIL) 20 MG tablet, Take 20 mg by mouth daily., Disp: , Rfl:  .  methocarbamol (ROBAXIN) 500 MG tablet, TAKE 1 TABLET BY MOUTH EVERY 6 TO 8 HOURS BY MOUTH AS NEEDED FOR SPASMS MUSCLE TENSION, Disp: , Rfl:  .  metoprolol tartrate (LOPRESSOR) 50 MG tablet, Take 50 mg by mouth every evening. , Disp: , Rfl: 0 .  neomycin-polymyxin b-dexamethasone (MAXITROL) 3.5-10000-0.1 SUSP, , Disp: , Rfl:  .  pantoprazole (PROTONIX) 40 MG tablet, Take 40 mg by mouth every evening. , Disp: , Rfl: 1 No Known Allergies Social History   Tobacco Use  Smoking Status Former Smoker  . Packs/day: 0.00  . Years: 48.00  . Pack years: 0.00  .  Types: Cigarettes  . Quit date: 08/18/2018  . Years since quitting: 1.9  Smokeless Tobacco Never Used   Objective:  There were no vitals filed for this visit. Constitutional Patient is a pleasant 79 y.o. African American female WD, WN in NAD.Marland Kitchen AAO x 3.  Vascular Capillary fill time to digits <3 seconds b/l lower extremities. Faintly palpable pedal pulses b/l. Pedal hair present. Lower extremity skin temperature gradient within normal limits. No cyanosis or clubbing noted.  Neurologic Normal speech. Oriented to person, place, and time. Pt has subjective symptoms of neuropathy. Protective sensation intact 5/5 intact  bilaterally with 10g monofilament b/l. Vibratory sensation diminished b/l. Proprioception intact bilaterally.  Dermatologic Pedal skin with normal turgor, texture and tone bilaterally. No open wounds bilaterally. No interdigital macerations bilaterally. Toenails 1-5 b/l elongated, discolored, dystrophic, thickened, crumbly with subungual debris and tenderness to dorsal palpation.  Orthopedic: Normal muscle strength 5/5 to all lower extremity muscle groups bilaterally. No pain crepitus or joint limitation noted with ROM b/l. Hallux valgus with bunion deformity noted b/l lower extremities. Hammertoes noted to the b/l lower extremities.   Assessment:   1. Pain due to onychomycosis of toenails of both feet   2. Acquired hammertoes of both feet   3. Diabetic polyneuropathy associated with type 2 diabetes mellitus ([place])    Plan:  Patient was evaluated and treated and all questions answered.  Onychomycosis with pain -Nails palliatively debridement as below. -Educated on self-care  Procedure: Nail Debridement Rationale: Pain Type of Debridement: manual, sharp debridement. Instrumentation: Nail nipper, rotary burr. Number of Nails: 10  -Examined patient. -Continue diabetic foot care principles. -Toenails 1-5 b/l were debrided in length and girth with sterile nail nippers and dremel without iatrogenic bleeding.  -Patient to report any pedal injuries to medical professional immediately. -Patient to continue soft, supportive shoe gear daily. -Patient/POA to call should there be question/concern in the interim.  Return in about 3 months (around 11/08/2020).  Marzetta Board, DPM

## 2020-08-29 ENCOUNTER — Ambulatory Visit: Payer: PPO

## 2020-08-29 ENCOUNTER — Other Ambulatory Visit: Payer: Self-pay | Admitting: Orthopedic Surgery

## 2020-08-29 DIAGNOSIS — M25511 Pain in right shoulder: Secondary | ICD-10-CM

## 2020-09-06 DIAGNOSIS — I2510 Atherosclerotic heart disease of native coronary artery without angina pectoris: Secondary | ICD-10-CM | POA: Diagnosis not present

## 2020-09-06 DIAGNOSIS — I361 Nonrheumatic tricuspid (valve) insufficiency: Secondary | ICD-10-CM | POA: Diagnosis not present

## 2020-09-06 DIAGNOSIS — I340 Nonrheumatic mitral (valve) insufficiency: Secondary | ICD-10-CM | POA: Diagnosis not present

## 2020-09-19 ENCOUNTER — Ambulatory Visit
Admission: RE | Admit: 2020-09-19 | Discharge: 2020-09-19 | Disposition: A | Discharge status: Home / Self Care | Payer: PPO | Source: Ambulatory Visit | Attending: Orthopedic Surgery | Admitting: Orthopedic Surgery

## 2020-09-19 DIAGNOSIS — M25511 Pain in right shoulder: Secondary | ICD-10-CM

## 2020-09-21 DIAGNOSIS — M25511 Pain in right shoulder: Secondary | ICD-10-CM | POA: Diagnosis not present

## 2020-09-25 DIAGNOSIS — I2510 Atherosclerotic heart disease of native coronary artery without angina pectoris: Secondary | ICD-10-CM | POA: Diagnosis not present

## 2020-09-25 DIAGNOSIS — E1165 Type 2 diabetes mellitus with hyperglycemia: Secondary | ICD-10-CM | POA: Diagnosis not present

## 2020-09-25 DIAGNOSIS — M5186 Other intervertebral disc disorders, lumbar region: Secondary | ICD-10-CM | POA: Diagnosis not present

## 2020-09-25 DIAGNOSIS — I10 Essential (primary) hypertension: Secondary | ICD-10-CM | POA: Diagnosis not present

## 2020-09-27 ENCOUNTER — Ambulatory Visit
Admission: RE | Admit: 2020-09-27 | Discharge: 2020-09-27 | Disposition: A | Discharge status: Home / Self Care | Payer: PPO | Source: Ambulatory Visit | Attending: Cardiovascular Disease | Admitting: Cardiovascular Disease

## 2020-09-27 ENCOUNTER — Other Ambulatory Visit: Payer: Self-pay

## 2020-09-27 DIAGNOSIS — Z1231 Encounter for screening mammogram for malignant neoplasm of breast: Secondary | ICD-10-CM | POA: Diagnosis not present

## 2020-10-04 DIAGNOSIS — M19011 Primary osteoarthritis, right shoulder: Secondary | ICD-10-CM | POA: Diagnosis not present

## 2020-10-04 DIAGNOSIS — M24111 Other articular cartilage disorders, right shoulder: Secondary | ICD-10-CM | POA: Diagnosis not present

## 2020-10-04 DIAGNOSIS — M7551 Bursitis of right shoulder: Secondary | ICD-10-CM | POA: Diagnosis not present

## 2020-10-04 DIAGNOSIS — M7541 Impingement syndrome of right shoulder: Secondary | ICD-10-CM | POA: Diagnosis not present

## 2020-10-04 DIAGNOSIS — G8918 Other acute postprocedural pain: Secondary | ICD-10-CM | POA: Diagnosis not present

## 2020-10-04 DIAGNOSIS — M75111 Incomplete rotator cuff tear or rupture of right shoulder, not specified as traumatic: Secondary | ICD-10-CM | POA: Diagnosis not present

## 2020-10-10 DIAGNOSIS — M19011 Primary osteoarthritis, right shoulder: Secondary | ICD-10-CM | POA: Diagnosis not present

## 2020-10-10 DIAGNOSIS — S46011D Strain of muscle(s) and tendon(s) of the rotator cuff of right shoulder, subsequent encounter: Secondary | ICD-10-CM | POA: Diagnosis not present

## 2020-10-10 DIAGNOSIS — M25611 Stiffness of right shoulder, not elsewhere classified: Secondary | ICD-10-CM | POA: Diagnosis not present

## 2020-10-10 DIAGNOSIS — M6281 Muscle weakness (generalized): Secondary | ICD-10-CM | POA: Diagnosis not present

## 2020-10-19 DIAGNOSIS — M25511 Pain in right shoulder: Secondary | ICD-10-CM | POA: Diagnosis not present

## 2020-10-19 DIAGNOSIS — M6281 Muscle weakness (generalized): Secondary | ICD-10-CM | POA: Diagnosis not present

## 2020-10-19 DIAGNOSIS — S46011D Strain of muscle(s) and tendon(s) of the rotator cuff of right shoulder, subsequent encounter: Secondary | ICD-10-CM | POA: Diagnosis not present

## 2020-10-19 DIAGNOSIS — M25611 Stiffness of right shoulder, not elsewhere classified: Secondary | ICD-10-CM | POA: Diagnosis not present

## 2020-10-25 DIAGNOSIS — I2510 Atherosclerotic heart disease of native coronary artery without angina pectoris: Secondary | ICD-10-CM | POA: Diagnosis not present

## 2020-10-25 DIAGNOSIS — M5186 Other intervertebral disc disorders, lumbar region: Secondary | ICD-10-CM | POA: Diagnosis not present

## 2020-10-25 DIAGNOSIS — I10 Essential (primary) hypertension: Secondary | ICD-10-CM | POA: Diagnosis not present

## 2020-10-25 DIAGNOSIS — E119 Type 2 diabetes mellitus without complications: Secondary | ICD-10-CM | POA: Diagnosis not present

## 2020-10-26 DIAGNOSIS — M6281 Muscle weakness (generalized): Secondary | ICD-10-CM | POA: Diagnosis not present

## 2020-10-26 DIAGNOSIS — M25562 Pain in left knee: Secondary | ICD-10-CM | POA: Diagnosis not present

## 2020-10-26 DIAGNOSIS — M25611 Stiffness of right shoulder, not elsewhere classified: Secondary | ICD-10-CM | POA: Diagnosis not present

## 2020-10-26 DIAGNOSIS — M25511 Pain in right shoulder: Secondary | ICD-10-CM | POA: Diagnosis not present

## 2020-10-26 DIAGNOSIS — S46011D Strain of muscle(s) and tendon(s) of the rotator cuff of right shoulder, subsequent encounter: Secondary | ICD-10-CM | POA: Diagnosis not present

## 2020-11-02 DIAGNOSIS — M6281 Muscle weakness (generalized): Secondary | ICD-10-CM | POA: Diagnosis not present

## 2020-11-02 DIAGNOSIS — M25511 Pain in right shoulder: Secondary | ICD-10-CM | POA: Diagnosis not present

## 2020-11-02 DIAGNOSIS — M25611 Stiffness of right shoulder, not elsewhere classified: Secondary | ICD-10-CM | POA: Diagnosis not present

## 2020-11-02 DIAGNOSIS — S46011D Strain of muscle(s) and tendon(s) of the rotator cuff of right shoulder, subsequent encounter: Secondary | ICD-10-CM | POA: Diagnosis not present

## 2020-11-07 DIAGNOSIS — M6281 Muscle weakness (generalized): Secondary | ICD-10-CM | POA: Diagnosis not present

## 2020-11-07 DIAGNOSIS — S46011D Strain of muscle(s) and tendon(s) of the rotator cuff of right shoulder, subsequent encounter: Secondary | ICD-10-CM | POA: Diagnosis not present

## 2020-11-07 DIAGNOSIS — M25511 Pain in right shoulder: Secondary | ICD-10-CM | POA: Diagnosis not present

## 2020-11-07 DIAGNOSIS — M25611 Stiffness of right shoulder, not elsewhere classified: Secondary | ICD-10-CM | POA: Diagnosis not present

## 2020-11-09 DIAGNOSIS — M25511 Pain in right shoulder: Secondary | ICD-10-CM | POA: Diagnosis not present

## 2020-11-21 ENCOUNTER — Ambulatory Visit (INDEPENDENT_AMBULATORY_CARE_PROVIDER_SITE_OTHER): Payer: Medicare Other | Admitting: Podiatry

## 2020-11-21 ENCOUNTER — Other Ambulatory Visit: Payer: Self-pay

## 2020-11-21 ENCOUNTER — Encounter: Payer: Self-pay | Admitting: Podiatry

## 2020-11-21 DIAGNOSIS — M2041 Other hammer toe(s) (acquired), right foot: Secondary | ICD-10-CM

## 2020-11-21 DIAGNOSIS — E1142 Type 2 diabetes mellitus with diabetic polyneuropathy: Secondary | ICD-10-CM

## 2020-11-21 DIAGNOSIS — M79675 Pain in left toe(s): Secondary | ICD-10-CM

## 2020-11-21 DIAGNOSIS — B351 Tinea unguium: Secondary | ICD-10-CM | POA: Diagnosis not present

## 2020-11-21 DIAGNOSIS — M79674 Pain in right toe(s): Secondary | ICD-10-CM | POA: Diagnosis not present

## 2020-11-21 DIAGNOSIS — M2042 Other hammer toe(s) (acquired), left foot: Secondary | ICD-10-CM

## 2020-11-21 DIAGNOSIS — I739 Peripheral vascular disease, unspecified: Secondary | ICD-10-CM

## 2020-11-23 NOTE — Progress Notes (Signed)
Subjective:  Patient ID: Felicia Shelton, female    DOB: June 14, 1941,  MRN: [id number]  80 y.o. female presents with at risk foot care with history of diabetic neuropathy and painful thick toenails that are difficult to trim. Pain interferes with ambulation. Aggravating factors include wearing enclosed shoe gear. Pain is relieved with periodic professional debridement.  Patient states blood glucose was 107 mg/dl this morning. She states she recently had arthroscopic surgery of right shoulder.  PCP is Dr. Orpah Cobb and last visit was 09/25/2020.   Review of Systems: Negative except as noted in the HPI.  Past Medical History:  Diagnosis Date  . Allergy   . Arthritis    back  . Diabetes mellitus without complication ([facility])   . Hx of adenomatous colonic polyp 05/26/2008  . Hyperlipidemia   . Hypertension   . Neuromuscular disorder [facility])    Past Surgical History:  Procedure Laterality Date  . ABDOMINAL HYSTERECTOMY    . APPENDECTOMY    . BREAST EXCISIONAL BIOPSY Left   . BREAST SURGERY     left breast  . CATARACT EXTRACTION Right 02/2020  . COLONOSCOPY  10/11/2015   polyps 2009  . TONSILLECTOMY     removed age 2-5  . TRANSFORAMINAL LUMBAR INTERBODY FUSION (TLIF) WITH PEDICLE SCREW FIXATION 2 LEVEL Right 12/31/2018   Procedure: RIGHT - SIDED LUMBAR 2-3,LUMBAR 3-4, LUMBAR 4-5 TRANSFORAMINAL LUMBAR INTERBODY FUSION WITH INSTRUMENTATION AND ALLOGRAFT;  Surgeon: Estill Bamberg, MD;  Location: [facility];  Service: Orthopedics;  Laterality: Right;   Patient Active Problem List   Diagnosis Date Noted  . Radiculopathy 12/31/2018  . Hypertension 08/04/2012  . Hx of adenomatous colonic polyp 05/26/2008   No Known Allergies Social History   Tobacco Use  Smoking Status Former Smoker  . Packs/day: 0.00  . Years: 48.00  . Pack years: 0.00  . Types: Cigarettes  . Quit date: 08/18/2018  . Years since quitting: 2.2  Smokeless Tobacco Never Used   Objective:  There were no vitals filed for this  visit. Constitutional Patient is a pleasant 80 y.o. African American female WD, WN in NAD.Marland Kitchen AAO x 3.  Vascular Capillary fill time to digits <3 seconds b/l lower extremities. Faintly palpable pedal pulses b/l. Pedal hair present. Lower extremity skin temperature gradient within normal limits. No cyanosis or clubbing noted.  Neurologic Normal speech. Oriented to person, place, and time. Pt has subjective symptoms of neuropathy. Protective sensation intact 5/5 intact bilaterally with 10g monofilament b/l. Vibratory sensation diminished b/l. Proprioception intact bilaterally.  Dermatologic Pedal skin with normal turgor, texture and tone bilaterally. No open wounds bilaterally. No interdigital macerations bilaterally. Toenails 1-5 b/l elongated, discolored, dystrophic, thickened, crumbly with subungual debris and tenderness to dorsal palpation.  Orthopedic: Normal muscle strength 5/5 to all lower extremity muscle groups bilaterally. No pain crepitus or joint limitation noted with ROM b/l. Hallux valgus with bunion deformity noted b/l lower extremities. Hammertoes noted to the b/l lower extremities.   Assessment:   1. Pain due to onychomycosis of toenails of both feet   2. Acquired hammertoes of both feet   3. PAD (peripheral artery disease) (HCC)   4. Diabetic polyneuropathy associated with type 2 diabetes mellitus (H[facility])    Plan:  Patient was evaluated and treated and all questions answered.  Onychomycosis with pain -Nails palliatively debridement as below. -Educated on self-care  Procedure: Nail Debridement Rationale: Pain Type of Debridement: manual, sharp debridement. Instrumentation: Nail nipper, rotary burr. Number of Nails: 10  -Examined patient. -Continue  diabetic foot care principles. -Toenails 1-5 b/l were debrided in length and girth with sterile nail nippers and dremel without iatrogenic bleeding.  -Patient to report any pedal injuries to medical professional  immediately. -Patient to continue soft, supportive shoe gear daily. -Patient/POA to call should there be question/concern in the interim.  Return in about 3 months (around 02/19/2021).  Freddie Breech, DPM

## 2021-01-24 IMAGING — MR MR SHOULDER*R* W/O CM
4 of 5 series · 15 of 40 positions shown · non-contrast
Comparison: None.

CLINICAL DATA: Chronic right shoulder pain.  No prior surgery.

EXAM:
MRI OF THE RIGHT SHOULDER WITHOUT CONTRAST
TECHNIQUE: Multiplanar, multisequence MR imaging of the shoulder was performed.
No intravenous contrast was administered.

[Series 6: PD fat-sat · axial · right · 4.0mm · 0.22mm/px · z∈[-37,+54]mm · 6 of 20 slices shown]
[im 1/20]
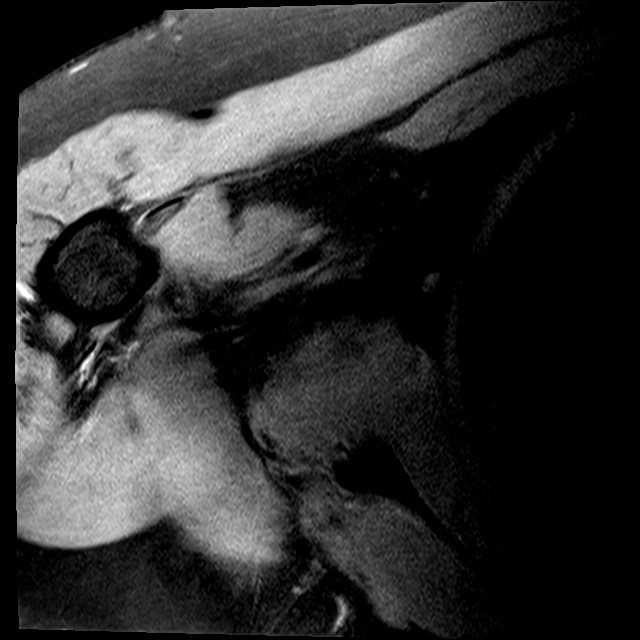
[im 4/20]
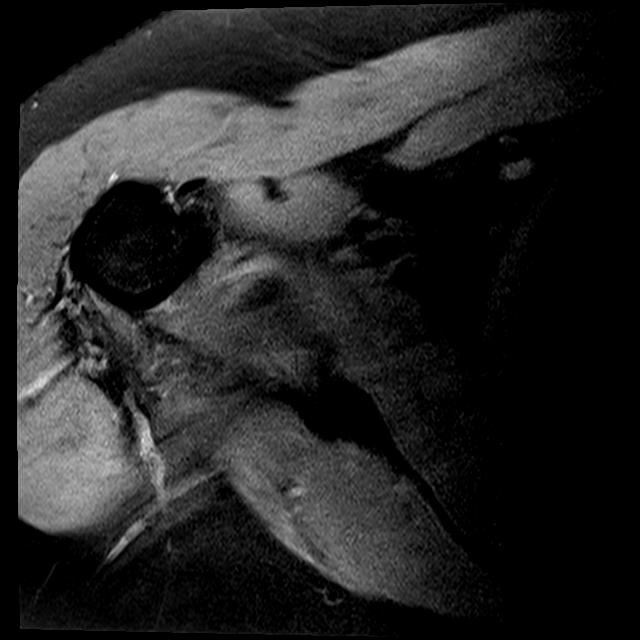
[im 8/20]
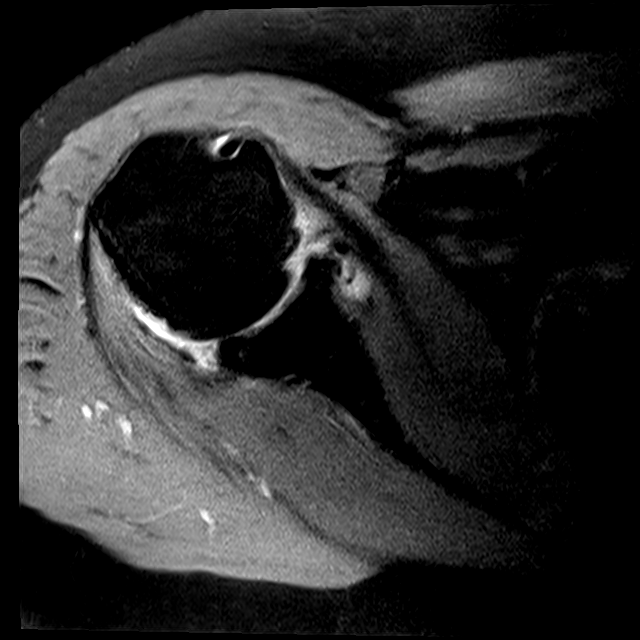
[im 12/20]
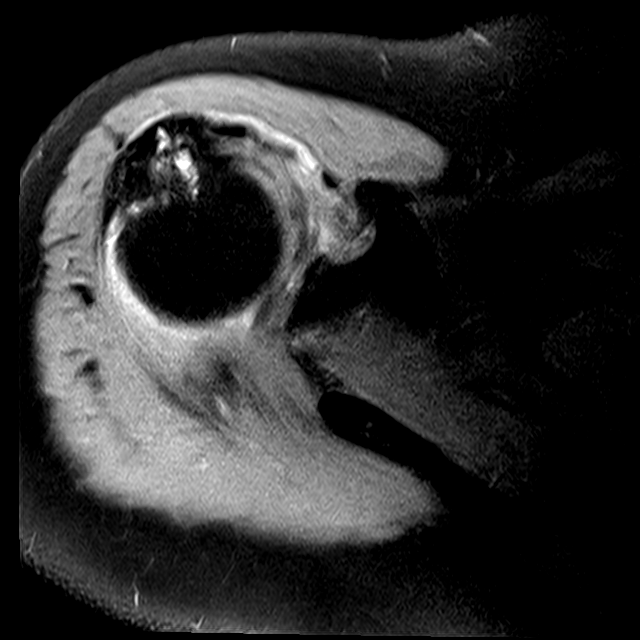
[im 16/20]
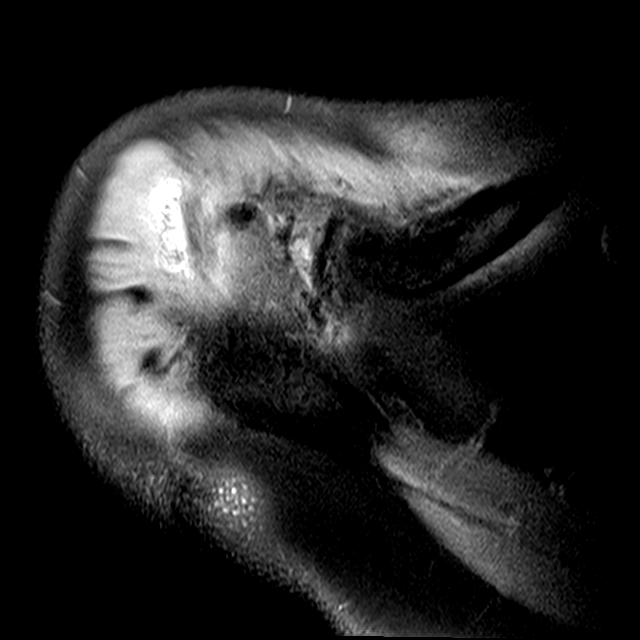
[im 20/20]
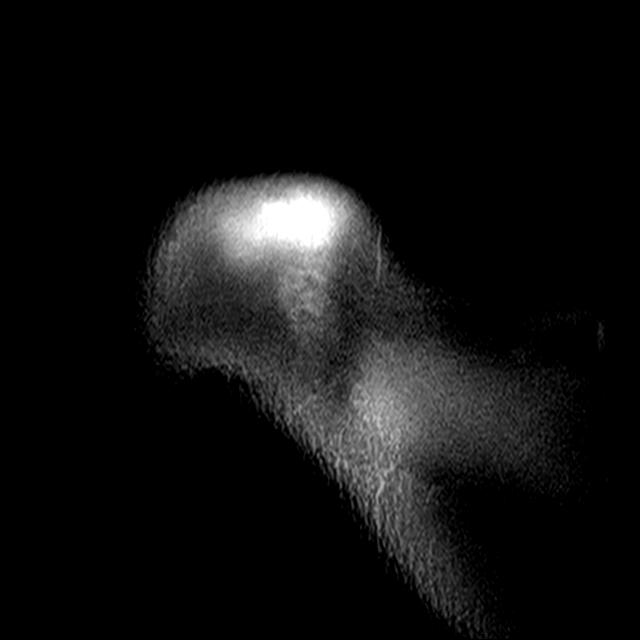

[Series 7: T2 fat-sat · oblique · right · 4.0mm · 0.22mm/px · 3 of 21 slices shown (1 of 2)]
[im 3/21]
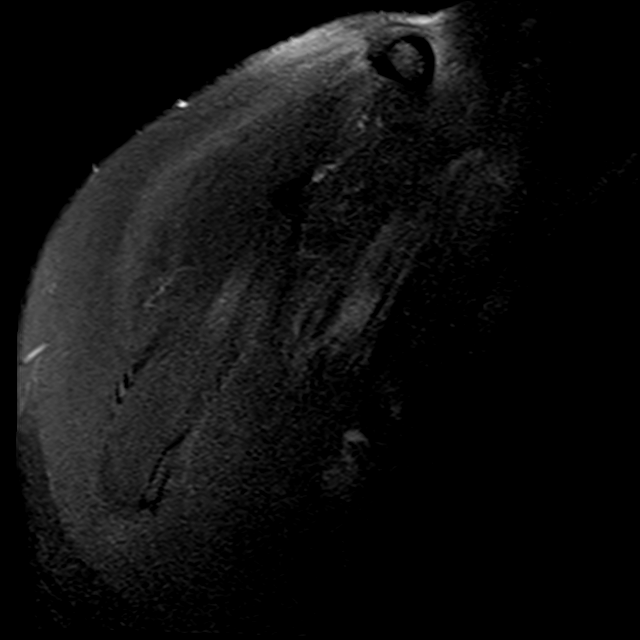
[im 12/21]
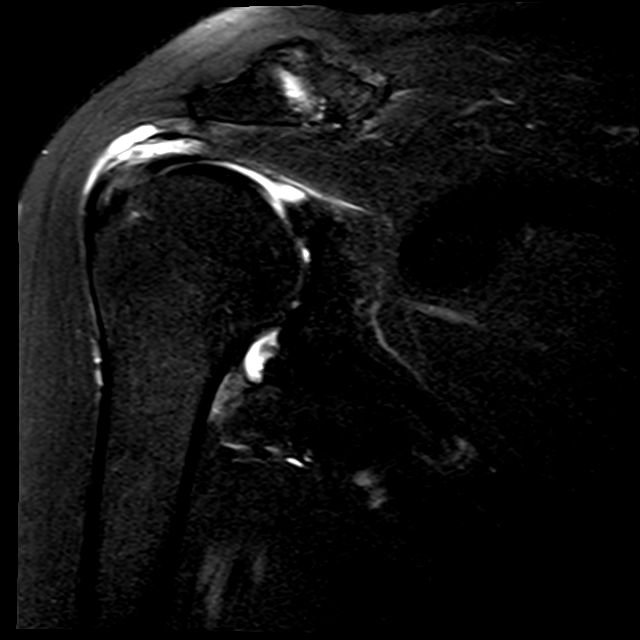
[im 18/21]
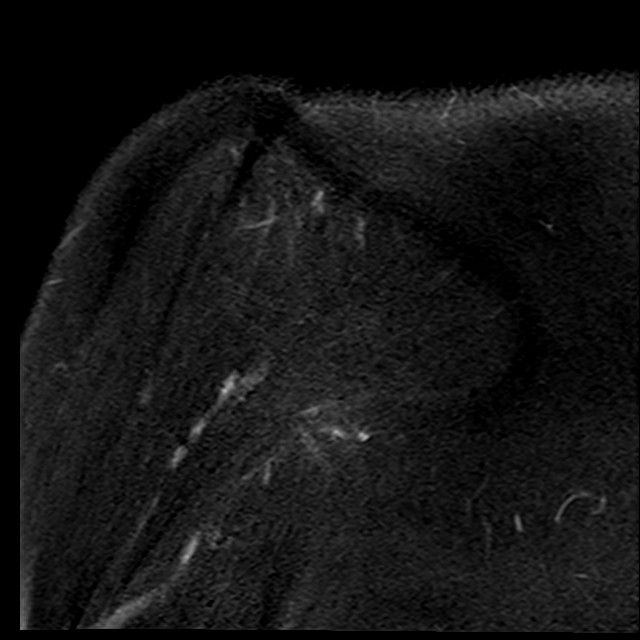

[Series 8: T2 fat-sat · oblique · right · 4.0mm · 0.44mm/px · 3 of 25 slices shown (2 of 2)]
[im 4/25]
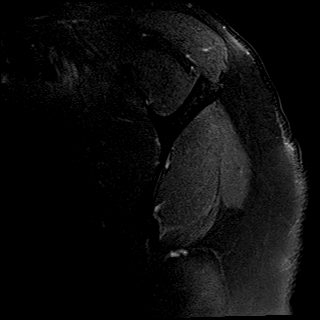
[im 13/25]
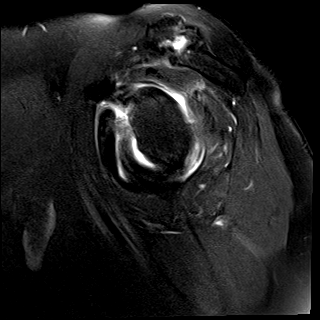
[im 22/25]
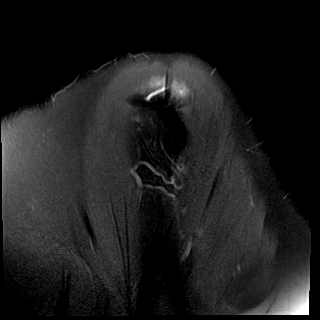

[Series 9: PD · oblique · right · 4.0mm · 0.22mm/px · 3 of 21 slices shown]
[im 3/21]
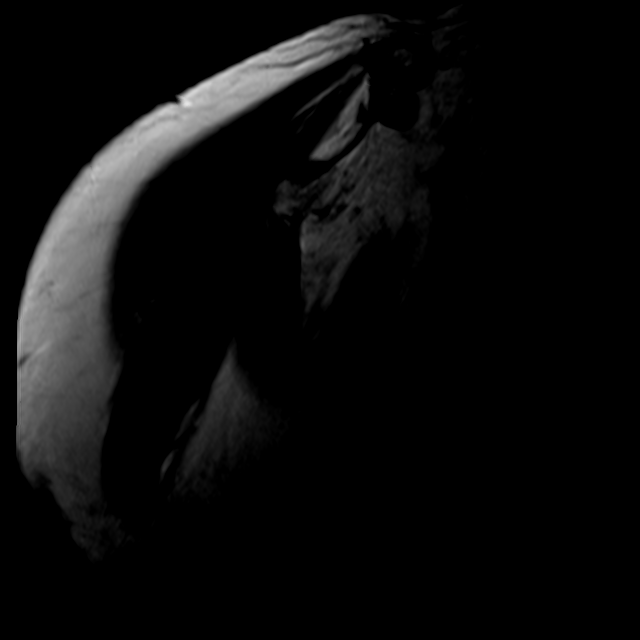
[im 12/21]
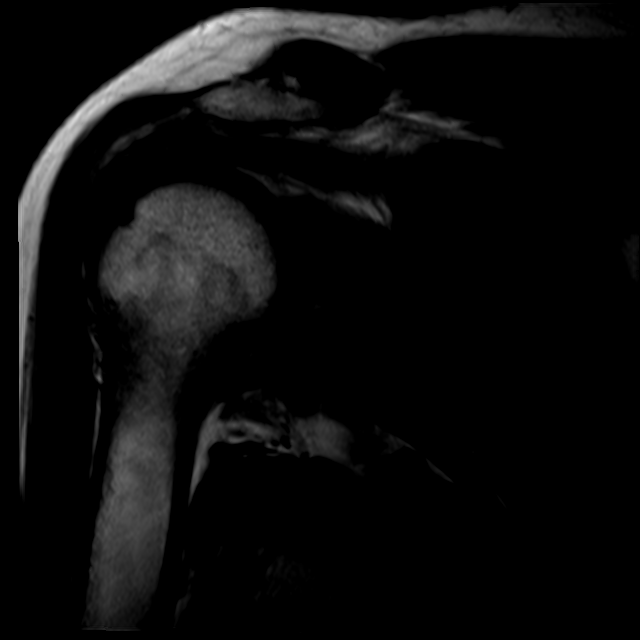
[im 18/21]
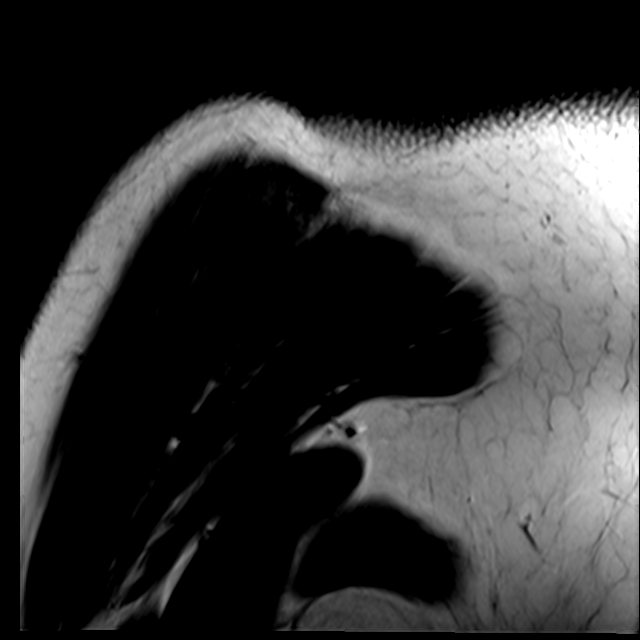

[15 of 40 positions shown; findings below may reference images not displayed]

FINDINGS: Rotator cuff: Partial-thickness bursal surface tear of the distal
infraspinatus tendon with high-grade intrasubstance extension
proximally and distally. The intrasubstance component extends into
the distal posterior supraspinatus tendon. Tiny intrasubstance tear
of the subscapularis tendon at the insertion. The teres minor tendon
is unremarkable.

Muscles: No atrophy or abnormal signal of the muscles of the rotator
cuff.

Biceps long head: Intact and normally positioned. Moderate
intra-articular tendinosis.

Acromioclavicular Joint: Moderate arthropathy of the
acromioclavicular joint. Type I acromion. No significant
subacromial/subdeltoid bursal fluid.

Glenohumeral Joint: Small joint effusion. Scattered
partial-thickness cartilage loss with a few small focal
full-thickness cartilage defects.

Labrum:  Degenerated but grossly intact.

Bones: No acute fracture or dislocation. Reactive subcortical cystic
change in the greater tuberosity. Small inferior humeral head
osteophytes.

Other: None.
IMPRESSION: 1. Partial-thickness bursal surface tear of the distal infraspinatus
tendon with high-grade intrasubstance extension proximally and
distally. The intrasubstance component extends into the distal
posterior supraspinatus tendon.
2. Tiny intrasubstance tear of the subscapularis tendon at the
insertion.
3. Moderate intra-articular biceps tendinosis.
4. Mild glenohumeral and moderate acromioclavicular osteoarthritis.

## 2021-02-20 ENCOUNTER — Encounter: Payer: Self-pay | Admitting: Podiatry

## 2021-02-20 ENCOUNTER — Ambulatory Visit: Payer: Medicare Other | Admitting: Podiatry

## 2021-02-20 ENCOUNTER — Other Ambulatory Visit: Payer: Self-pay

## 2021-02-20 DIAGNOSIS — M2042 Other hammer toe(s) (acquired), left foot: Secondary | ICD-10-CM

## 2021-02-20 DIAGNOSIS — E1142 Type 2 diabetes mellitus with diabetic polyneuropathy: Secondary | ICD-10-CM | POA: Diagnosis not present

## 2021-02-20 DIAGNOSIS — M79675 Pain in left toe(s): Secondary | ICD-10-CM

## 2021-02-20 DIAGNOSIS — B351 Tinea unguium: Secondary | ICD-10-CM | POA: Diagnosis not present

## 2021-02-20 DIAGNOSIS — M2041 Other hammer toe(s) (acquired), right foot: Secondary | ICD-10-CM

## 2021-02-20 DIAGNOSIS — I739 Peripheral vascular disease, unspecified: Secondary | ICD-10-CM | POA: Diagnosis not present

## 2021-02-20 DIAGNOSIS — M79674 Pain in right toe(s): Secondary | ICD-10-CM

## 2021-02-25 NOTE — Progress Notes (Signed)
  Subjective:  Patient ID: Felicia Shelton, female    DOB: 12/23/1940,  MRN: [id number]  80 y.o. female presents with at risk foot care with history of diabetic neuropathy and painful thick toenails that are difficult to trim. Pain interferes with ambulation. Aggravating factors include wearing enclosed shoe gear. Pain is relieved with periodic professional debridement.  Patient states blood glucose was 107 mg/dl this morning.   PCP is Dr. Dixie Dials and last visit was 10/25/2020.   She voices no new pedal problems on today's visit.  Review of Systems: Negative except as noted in the HPI.   No Known Allergies   Objective:  There were no vitals filed for this visit. Constitutional Patient is a pleasant 80 y.o. African American female WD, WN in NAD.Marland Kitchen AAO x 3.  Vascular Capillary fill time to digits <3 seconds b/l lower extremities. Faintly palpable pedal pulses b/l. Pedal hair present. Lower extremity skin temperature gradient within normal limits. No cyanosis or clubbing noted.  Neurologic Normal speech. Oriented to person, place, and time. Pt has subjective symptoms of neuropathy. Protective sensation intact 5/5 intact bilaterally with 10g monofilament b/l. Vibratory sensation diminished b/l. Proprioception intact bilaterally.  Dermatologic Pedal skin with normal turgor, texture and tone bilaterally. No open wounds bilaterally. No interdigital macerations bilaterally. Toenails L hallux, L 2nd toe, L 3rd toe, L 4th toe, R hallux, R 2nd toe, R 3rd toe and R 4th toe elongated, discolored, dystrophic, thickened, and crumbly with subungual debris and tenderness to dorsal palpation. Anonychia noted L 5th toe and R 5th toe. Nailbed(s) epithelialized.   Orthopedic: Normal muscle strength 5/5 to all lower extremity muscle groups bilaterally. No pain crepitus or joint limitation noted with ROM b/l. Hallux valgus with bunion deformity noted b/l lower extremities. Hammertoes noted to the b/l lower extremities.    Assessment:   1. Pain due to onychomycosis of toenails of both feet   2. Acquired hammertoes of both feet   3. PAD (peripheral artery disease) ([place])   4. Diabetic polyneuropathy associated with type 2 diabetes mellitus ([place])    Plan:  Patient was evaluated and treated and all questions answered.  Onychomycosis with pain -Nails palliatively debridement as below. -Educated on self-care  Procedure: Nail Debridement Rationale: Pain Type of Debridement: manual, sharp debridement. Instrumentation: Nail nipper, rotary burr. Number of Nails: 10  -Examined patient. -Continue diabetic foot care principles. -Toenails L hallux, L 2nd toe, L 3rd toe, L 4th toe, R hallux, R 2nd toe, R 3rd toe and R 4th toe debrided in length and girth without iatrogenic bleeding with sterile nail nipper and dremel.  -Patient to report any pedal injuries to medical professional immediately. -Patient/POA to call should there be question/concern in the interim.  Return in about 3 months (around 05/22/2021).  Marzetta Board, DPM

## 2021-03-20 ENCOUNTER — Other Ambulatory Visit: Payer: Self-pay | Admitting: Orthopaedic Surgery

## 2021-03-20 DIAGNOSIS — Z01818 Encounter for other preprocedural examination: Secondary | ICD-10-CM

## 2021-04-09 NOTE — Progress Notes (Signed)
DUE TO COVID-19 ONLY ONE VISITOR IS ALLOWED TO COME WITH YOU AND STAY IN THE WAITING ROOM ONLY DURING PRE OP AND PROCEDURE DAY OF SURGERY. THE 1 VISITOR  MAY VISIT WITH YOU AFTER SURGERY IN YOUR PRIVATE ROOM DURING VISITING HOURS ONLY!  YOU NEED TO HAVE A COVID 19 TEST ON_______ @_______ , THIS TEST MUST BE DONE BEFORE SURGERY,  COVID TESTING SITE [place] [id number], IT IS ON THE RIGHT GOING OUT [address] APPROXIMATELY  2 MINUTES PAST [employer] ON THE RIGHT. ONCE YOUR COVID TEST IS COMPLETED,  PLEASE BEGIN THE QUARANTINE INSTRUCTIONS AS OUTLINED IN YOUR HANDOUT.                Felicia Shelton  04/09/2021   Your procedure is scheduled on:                04/17/2021   Report to [facility] Main  Entrance   Report to admitting at    0730AM      Call this number if you have problems the morning of surgery [phone]    REMEMBER: NO  SOLID FOOD CANDY OR GUM AFTER MIDNIGHT. CLEAR LIQUIDS UNTIL   0700AM         . NOTHING BY MOUTH EXCEPT CLEAR LIQUIDS UNTIL    0700AM    . PLEASE FINISH ENSURE DRINK PER SURGEON ORDER  WHICH NEEDS TO BE COMPLETED AT 0700AM     CLEAR LIQUID DIET   Foods Allowed                                                                    Coffee and tea, regular and decaf                            Fruit ices (not with fruit pulp)                                      Iced Popsicles                                    Carbonated beverages, regular and diet                                    Cranberry, grape and apple juices Sports drinks like Gatorade Lightly seasoned clear broth or consume(fat free) Sugar, honey syrup ___________________________________________________________________      BRUSH YOUR TEETH MORNING OF SURGERY AND RINSE YOUR MOUTH OUT, NO CHEWING GUM CANDY OR MINTS.     Take these medicines the morning of surgery with A SIP OF WATER:           AMLODIPINE  DO NOT TAKE ANY DIABETIC MEDICATIONS DAY OF YOUR  SURGERY                               You may not have any metal on your body  including hair pins and              piercings  Do not wear jewelry, make-up, lotions, powders or perfumes, deodorant             Do not wear nail polish on your fingernails.  Do not shave  48 hours prior to surgery.              Men may shave face and neck.   Do not bring valuables to the hospital. [place].  Contacts, dentures or bridgework may not be worn into surgery.  Leave suitcase in the car. After surgery it may be brought to your room.     Patients discharged the day of surgery will not be allowed to drive home. IF YOU ARE HAVING SURGERY AND GOING HOME THE SAME DAY, YOU MUST HAVE AN ADULT TO DRIVE YOU HOME AND BE WITH YOU FOR 24 HOURS. YOU MAY GO HOME BY TAXI OR UBER OR ORTHERWISE, BUT AN ADULT MUST ACCOMPANY YOU HOME AND STAY WITH YOU FOR 24 HOURS.  Name and phone number of your driver:  Special Instructions: N/A              Please read over the following fact sheets you were given: _____________________________________________________________________  [facility] - Preparing for Surgery Before surgery, you can play an important role.  Because skin is not sterile, your skin needs to be as free of germs as possible.  You can reduce the number of germs on your skin by washing with CHG (chlorahexidine gluconate) soap before surgery.  CHG is an antiseptic cleaner which kills germs and bonds with the skin to continue killing germs even after washing. Please DO NOT use if you have an allergy to CHG or antibacterial soaps.  If your skin becomes reddened/irritated stop using the CHG and inform your nurse when you arrive at Short Stay. Do not shave (including legs and underarms) for at least 48 hours prior to the first CHG shower.  You may shave your face/neck. Please follow these instructions carefully:  1.  Shower with CHG Soap the night before surgery and the   morning of Surgery.  2.  If you choose to wash your hair, wash your hair first as usual with your  normal  shampoo.  3.  After you shampoo, rinse your hair and body thoroughly to remove the  shampoo.                           4.  Use CHG as you would any other liquid soap.  You can apply chg directly  to the skin and wash                       Gently with a scrungie or clean washcloth.  5.  Apply the CHG Soap to your body ONLY FROM THE NECK DOWN.   Do not use on face/ open                           Wound or open sores. Avoid contact with eyes, ears mouth and genitals (private parts).                       Wash face,  Development worker, international aid (private parts)  with your normal soap.             6.  Wash thoroughly, paying special attention to the area where your surgery  will be performed.  7.  Thoroughly rinse your body with warm water from the neck down.  8.  DO NOT shower/wash with your normal soap after using and rinsing off  the CHG Soap.                9.  Pat yourself dry with a clean towel.            10.  Wear clean pajamas.            11.  Place clean sheets on your bed the night of your first shower and do not  sleep with pets. Day of Surgery : Do not apply any lotions/deodorants the morning of surgery.  Please wear clean clothes to the hospital/surgery center.  FAILURE TO FOLLOW THESE INSTRUCTIONS MAY RESULT IN THE CANCELLATION OF YOUR SURGERY PATIENT SIGNATURE_________________________________  NURSE SIGNATURE__________________________________  ________________________________________________________________________

## 2021-04-12 ENCOUNTER — Other Ambulatory Visit: Payer: Self-pay

## 2021-04-12 ENCOUNTER — Ambulatory Visit (HOSPITAL_COMMUNITY)
Admission: RE | Admit: 2021-04-12 | Discharge: 2021-04-12 | Disposition: A | Discharge status: Home / Self Care | Payer: Medicare Other | Source: Ambulatory Visit | Attending: Orthopaedic Surgery | Admitting: Orthopaedic Surgery

## 2021-04-12 ENCOUNTER — Encounter (HOSPITAL_COMMUNITY): Payer: Self-pay

## 2021-04-12 ENCOUNTER — Encounter (HOSPITAL_COMMUNITY)
Admission: RE | Admit: 2021-04-12 | Discharge: 2021-04-12 | Disposition: A | Discharge status: Home / Self Care | Payer: Medicare Other | Source: Ambulatory Visit | Attending: Orthopaedic Surgery | Admitting: Orthopaedic Surgery

## 2021-04-12 DIAGNOSIS — Z87891 Personal history of nicotine dependence: Secondary | ICD-10-CM | POA: Diagnosis not present

## 2021-04-12 DIAGNOSIS — E119 Type 2 diabetes mellitus without complications: Secondary | ICD-10-CM | POA: Diagnosis not present

## 2021-04-12 DIAGNOSIS — Z01818 Encounter for other preprocedural examination: Secondary | ICD-10-CM | POA: Diagnosis not present

## 2021-04-12 HISTORY — DX: Gastro-esophageal reflux disease without esophagitis: K21.9

## 2021-04-12 LAB — PROTIME-INR
INR: 1 (ref 0.8–1.2)
Prothrombin Time: 13.6 seconds (ref 11.4–15.2)

## 2021-04-12 LAB — APTT: aPTT: 26 seconds (ref 24–36)

## 2021-04-12 LAB — GLUCOSE, CAPILLARY: Glucose-Capillary: 101 mg/dL — ABNORMAL HIGH (ref 70–99)

## 2021-04-12 NOTE — H&P (Signed)
TOTAL KNEE ADMISSION H&P  Patient is being admitted for left total knee arthroplasty.  Subjective:  Chief Complaint:left knee pain.  HPI: Felicia Shelton, 80 y.o. female, has a history of pain and functional disability in the left knee due to arthritis and has failed non-surgical conservative treatments for greater than 12 weeks to includeNSAID's and/or analgesics, corticosteriod injections, viscosupplementation injections, flexibility and strengthening excercises, use of assistive devices, weight reduction as appropriate and activity modification.  Onset of symptoms was gradual, starting 5 years ago with gradually worsening course since that time. The patient noted no past surgery on the left knee(s).  Patient currently rates pain in the left knee(s) at 10 out of 10 with activity. Patient has night pain, worsening of pain with activity and weight bearing, pain that interferes with activities of daily living, crepitus and joint swelling.  Patient has evidence of subchondral cysts, subchondral sclerosis, periarticular osteophytes and joint space narrowing by imaging studies.  There is no active infection.  Patient Active Problem List   Diagnosis Date Noted  . Radiculopathy 12/31/2018  . Hypertension 08/04/2012  . Hx of adenomatous colonic polyp 05/26/2008   Past Medical History:  Diagnosis Date  . Allergy   . Arthritis    back  . Diabetes mellitus without complication ([place])   . Hx of adenomatous colonic polyp 05/26/2008  . Hyperlipidemia   . Hypertension   . Neuromuscular disorder [facility])     Past Surgical History:  Procedure Laterality Date  . ABDOMINAL HYSTERECTOMY    . APPENDECTOMY    . BREAST EXCISIONAL BIOPSY Left   . BREAST SURGERY     left breast  . CATARACT EXTRACTION Right 02/2020  . COLONOSCOPY  10/11/2015   polyps 2009  . TONSILLECTOMY     removed age 32-5  . TRANSFORAMINAL LUMBAR INTERBODY FUSION (TLIF) WITH PEDICLE SCREW FIXATION 2 LEVEL Right 12/31/2018   Procedure: RIGHT -  SIDED LUMBAR 2-3,LUMBAR 3-4, LUMBAR 4-5 TRANSFORAMINAL LUMBAR INTERBODY FUSION WITH INSTRUMENTATION AND ALLOGRAFT;  Surgeon: Phylliss Bob, MD;  Location: [place];  Service: Orthopedics;  Laterality: Right;    No current facility-administered medications for this encounter.   Current Outpatient Medications  Medication Sig Dispense Refill Last Dose  . acetaminophen (TYLENOL) 650 MG CR tablet Take 1,300 mg by mouth daily as needed for pain.     Marland Kitchen amLODipine (NORVASC) 5 MG tablet Take 5 mg by mouth daily.      Marland Kitchen Apoaequorin (PREVAGEN) 10 MG CAPS Take 10 mg by mouth daily.     Marland Kitchen aspirin 325 MG EC tablet Take 325 mg by mouth every 6 (six) hours as needed for pain.     Marland Kitchen atorvastatin (LIPITOR) 40 MG tablet Take 40 mg by mouth daily.     . Cholecalciferol (VITAMIN D-3) 5000 units TABS Take 5,000 Units by mouth every Monday, Wednesday, and Friday.      . gabapentin (NEURONTIN) 300 MG capsule Take 300 mg by mouth at bedtime.     Marland Kitchen glimepiride (AMARYL) 1 MG tablet Take 1 mg by mouth daily.     . Homeopathic Products (THERAWORX RELIEF EX) Apply 1 application topically daily as needed (cramps).     Marland Kitchen lisinopril (PRINIVIL,ZESTRIL) 20 MG tablet Take 20 mg by mouth daily.     . meloxicam (MOBIC) 15 MG tablet Take 15 mg by mouth daily as needed for pain.     . metFORMIN (GLUCOPHAGE) 500 MG tablet Take 250 mg by mouth 2 (two) times daily.     Marland Kitchen  metoprolol tartrate (LOPRESSOR) 50 MG tablet Take 50 mg by mouth every evening.   0   . OVER THE COUNTER MEDICATION Apply 1 application topically daily as needed (pain). Pain relief cream with lidocaine     . pantoprazole (PROTONIX) 40 MG tablet Take 40 mg by mouth every evening.   1    No Known Allergies  Social History   Tobacco Use  . Smoking status: Former Smoker    Packs/day: 0.00    Years: 48.00    Pack years: 0.00    Types: Cigarettes    Quit date: 08/18/2018    Years since quitting: 2.6  . Smokeless tobacco: Never Used  Substance Use Topics  . Alcohol  use: Not Currently    Alcohol/week: 0.0 standard drinks    Comment: occasional beer    Family History  Adopted: Yes  Problem Relation Age of Onset  . Colon cancer Maternal Aunt      Review of Systems  Musculoskeletal: Positive for arthralgias.       Left knee  All other systems reviewed and are negative.   Objective:  Physical Exam Constitutional:      Appearance: Normal appearance.  HENT:     Head: Normocephalic and atraumatic.     Nose: Nose normal.     Mouth/Throat:     Pharynx: Oropharynx is clear.  Eyes:     Extraocular Movements: Extraocular movements intact.  Cardiovascular:     Rate and Rhythm: Normal rate and regular rhythm.     Pulses: Normal pulses.  Pulmonary:     Effort: Pulmonary effort is normal.  Abdominal:     Palpations: Abdomen is soft.  Musculoskeletal:     Cervical back: Normal range of motion.     Comments: Left knee motion is fairly good from 0-120.  She has medial joint line pain.  She also has a fullness along the medial aspect of the proximal tibia consistent with possibly a lipoma.  This is nontender.  She has some crepitation.  I feel at most trace effusion.  Hip motion is good and straight leg raise is negative.  Sensation and motor function are intact in her feet with palpable pulses on both sides.   Skin:    General: Skin is warm and dry.  Neurological:     General: No focal deficit present.     Mental Status: She is alert and oriented to person, place, and time. Mental status is at baseline.  Psychiatric:        Mood and Affect: Mood normal.        Behavior: Behavior normal.        Thought Content: Thought content normal.        Judgment: Judgment normal.     Vital signs in last 24 hours: BP: ()/()  Arterial Line BP: ()/()   Labs:   Estimated body mass index is 29.21 kg/m as calculated from the following:   Height as of 04/18/20: 5\' 6"  (1.676 m).   Weight as of 04/18/20: 82.1 kg.   Imaging Review Plain radiographs  demonstrate severe degenerative joint disease of the left knee(s). The overall alignment isneutral. The bone quality appears to be good for age and reported activity level.      Assessment/Plan:  End stage primary arthritis, left knee   The patient history, physical examination, clinical judgment of the provider and imaging studies are consistent with end stage degenerative joint disease of the left knee(s) and total knee arthroplasty is  deemed medically necessary. The treatment options including medical management, injection therapy arthroscopy and arthroplasty were discussed at length. The risks and benefits of total knee arthroplasty were presented and reviewed. The risks due to aseptic loosening, infection, stiffness, patella tracking problems, thromboembolic complications and other imponderables were discussed. The patient acknowledged the explanation, agreed to proceed with the plan and consent was signed. Patient is being admitted for inpatient treatment for surgery, pain control, PT, OT, prophylactic antibiotics, VTE prophylaxis, progressive ambulation and ADL's and discharge planning. The patient is planning to be discharged home with home health services  Patient's anticipated LOS is less than 2 midnights, meeting these requirements: - Younger than 77 - Lives within 1 hour of care - Has a competent adult at home to recover with post-op recover - NO history of  - Chronic pain requiring opiods  - Diabetes  - Coronary Artery Disease  - Heart failure  - Heart attack  - Stroke  - DVT/VTE  - Cardiac arrhythmia  - Respiratory Failure/COPD  - Renal failure  - Anemia  - Advanced Liver disease

## 2021-04-12 NOTE — Progress Notes (Signed)
Anesthesia Review:  PCP: DR Dixie Dials LOV 03/21/21 have requested ov note and ekg done  Cardiologist : Chest x-ray : EKG : 03/21/21- office to fax  Echo : Stress test: Cardiac Cath :  Activity level: can do a flight of stairs without difficulty  Sleep Study/ CPAP : none  Fasting Blood Sugar :      / Checks Blood Sugar -- times a day:   Blood Thinner/ Instructions /Last Dose: ASA / Instructions/ Last Dose :  DM- type 2 hgba1c-03/21/21-6.2 Checks glucose once daily  CBC/Diff, CMp , hgba1c on chart from 03/21/21.   Pt was instructed no nail polish adb voiced understanding.

## 2021-04-16 MED ORDER — BUPIVACAINE LIPOSOME 1.3 % IJ SUSP
20.0000 mL | Freq: Once | INTRAMUSCULAR | Status: DC
  Filled 2021-04-16: qty 20

## 2021-04-16 MED ORDER — TRANEXAMIC ACID 1000 MG/10ML IV SOLN
2000.0000 mg | INTRAVENOUS | Status: DC
  Filled 2021-04-16: qty 20

## 2021-04-17 ENCOUNTER — Encounter (HOSPITAL_COMMUNITY)
Admission: RE | Disposition: A | Discharge status: Home / Self Care | Payer: Self-pay | Source: Home / Self Care | Attending: Orthopaedic Surgery

## 2021-04-17 ENCOUNTER — Ambulatory Visit (HOSPITAL_COMMUNITY): Payer: Medicare Other | Admitting: Anesthesiology

## 2021-04-17 ENCOUNTER — Observation Stay (HOSPITAL_COMMUNITY)
Admission: RE | Admit: 2021-04-17 | Discharge: 2021-04-18 | Disposition: A | Discharge status: Home / Self Care | Payer: Medicare Other | Attending: Orthopaedic Surgery | Admitting: Orthopaedic Surgery

## 2021-04-17 ENCOUNTER — Encounter (HOSPITAL_COMMUNITY): Payer: Self-pay | Admitting: Orthopaedic Surgery

## 2021-04-17 ENCOUNTER — Ambulatory Visit (HOSPITAL_COMMUNITY): Payer: Medicare Other | Admitting: Physician Assistant

## 2021-04-17 ENCOUNTER — Other Ambulatory Visit: Payer: Self-pay

## 2021-04-17 DIAGNOSIS — Z79899 Other long term (current) drug therapy: Secondary | ICD-10-CM | POA: Insufficient documentation

## 2021-04-17 DIAGNOSIS — E119 Type 2 diabetes mellitus without complications: Secondary | ICD-10-CM | POA: Insufficient documentation

## 2021-04-17 DIAGNOSIS — Z7984 Long term (current) use of oral hypoglycemic drugs: Secondary | ICD-10-CM | POA: Diagnosis not present

## 2021-04-17 DIAGNOSIS — G8929 Other chronic pain: Secondary | ICD-10-CM | POA: Insufficient documentation

## 2021-04-17 DIAGNOSIS — M1712 Unilateral primary osteoarthritis, left knee: Principal | ICD-10-CM | POA: Diagnosis present

## 2021-04-17 DIAGNOSIS — Z7982 Long term (current) use of aspirin: Secondary | ICD-10-CM | POA: Diagnosis not present

## 2021-04-17 DIAGNOSIS — I10 Essential (primary) hypertension: Secondary | ICD-10-CM | POA: Diagnosis not present

## 2021-04-17 DIAGNOSIS — Z87891 Personal history of nicotine dependence: Secondary | ICD-10-CM | POA: Insufficient documentation

## 2021-04-17 HISTORY — PX: TOTAL KNEE ARTHROPLASTY: SHX125

## 2021-04-17 LAB — CBC WITH DIFFERENTIAL/PLATELET
Abs Immature Granulocytes: 0.01 10*3/uL (ref 0.00–0.07)
Basophils Absolute: 0.1 10*3/uL (ref 0.0–0.1)
Basophils Relative: 1 %
Eosinophils Absolute: 0.2 10*3/uL (ref 0.0–0.5)
Eosinophils Relative: 4 %
HCT: 38.3 % (ref 36.0–46.0)
Hemoglobin: 12.8 g/dL (ref 12.0–15.0)
Immature Granulocytes: 0 %
Lymphocytes Relative: 40 %
Lymphs Abs: 2.3 10*3/uL (ref 0.7–4.0)
MCH: 28.1 pg (ref 26.0–34.0)
MCHC: 33.4 g/dL (ref 30.0–36.0)
MCV: 84 fL (ref 80.0–100.0)
Monocytes Absolute: 0.5 10*3/uL (ref 0.1–1.0)
Monocytes Relative: 8 %
Neutro Abs: 2.7 10*3/uL (ref 1.7–7.7)
Neutrophils Relative %: 47 %
Platelets: 238 10*3/uL (ref 150–400)
RBC: 4.56 MIL/uL (ref 3.87–5.11)
RDW: 14.6 % (ref 11.5–15.5)
WBC: 5.8 10*3/uL (ref 4.0–10.5)
nRBC: 0 % (ref 0.0–0.2)

## 2021-04-17 LAB — TYPE AND SCREEN
ABO/RH(D): O POS
Antibody Screen: NEGATIVE
Unit division: 0
Unit division: 0

## 2021-04-17 LAB — COMPREHENSIVE METABOLIC PANEL
ALT: 19 U/L (ref 0–44)
AST: 24 U/L (ref 15–41)
Albumin: 4 g/dL (ref 3.5–5.0)
Alkaline Phosphatase: 77 U/L (ref 38–126)
Anion gap: 9 (ref 5–15)
BUN: 16 mg/dL (ref 8–23)
CO2: 25 mmol/L (ref 22–32)
Calcium: 9.1 mg/dL (ref 8.9–10.3)
Chloride: 106 mmol/L (ref 98–111)
Creatinine, Ser: 1.43 mg/dL — ABNORMAL HIGH (ref 0.44–1.00)
GFR, Estimated: 38 mL/min — ABNORMAL LOW (ref >60)
Glucose, Bld: 136 mg/dL — ABNORMAL HIGH (ref 70–99)
Potassium: 3.3 mmol/L — ABNORMAL LOW (ref 3.5–5.1)
Sodium: 140 mmol/L (ref 135–145)
Total Bilirubin: 0.5 mg/dL (ref 0.3–1.2)
Total Protein: 6.9 g/dL (ref 6.5–8.1)

## 2021-04-17 LAB — BPAM RBC
Blood Product Expiration Date: 202206282359
Blood Product Expiration Date: 202206282359
Unit Type and Rh: 5100
Unit Type and Rh: 5100

## 2021-04-17 LAB — GLUCOSE, CAPILLARY: Glucose-Capillary: 131 mg/dL — ABNORMAL HIGH (ref 70–99)

## 2021-04-17 SURGERY — ARTHROPLASTY, KNEE, TOTAL
Anesthesia: Monitor Anesthesia Care | Site: Knee | Laterality: Left

## 2021-04-17 MED ORDER — GLYCOPYRROLATE PF 0.2 MG/ML IJ SOSY
PREFILLED_SYRINGE | INTRAMUSCULAR | Status: AC
  Filled 2021-04-17: qty 1

## 2021-04-17 MED ORDER — CEFAZOLIN SODIUM-DEXTROSE 2-4 GM/100ML-% IV SOLN
2.0000 g | Freq: Four times a day (QID) | INTRAVENOUS | Status: AC
  Administered 2021-04-17: 2 g via INTRAVENOUS
  Filled 2021-04-17: qty 100

## 2021-04-17 MED ORDER — LISINOPRIL 20 MG PO TABS
20.0000 mg | ORAL_TABLET | Freq: Every day | ORAL | Status: DC
  Administered 2021-04-18: 20 mg via ORAL
  Filled 2021-04-17: qty 1

## 2021-04-17 MED ORDER — FENTANYL CITRATE (PF) 100 MCG/2ML IJ SOLN
INTRAMUSCULAR | Status: AC
  Filled 2021-04-17: qty 2

## 2021-04-17 MED ORDER — ONDANSETRON HCL 4 MG PO TABS
4.0000 mg | ORAL_TABLET | Freq: Four times a day (QID) | ORAL | Status: DC | PRN
  Filled 2021-04-17: qty 1

## 2021-04-17 MED ORDER — MIDAZOLAM HCL 2 MG/2ML IJ SOLN: 1.0000 mg | INTRAMUSCULAR | Status: DC

## 2021-04-17 MED ORDER — TRANEXAMIC ACID-NACL 1000-0.7 MG/100ML-% IV SOLN
1000.0000 mg | Freq: Once | INTRAVENOUS | Status: AC
  Administered 2021-04-17: 1000 mg via INTRAVENOUS

## 2021-04-17 MED ORDER — SODIUM CHLORIDE (PF) 0.9 % IJ SOLN
INTRAMUSCULAR | Status: DC | PRN
  Administered 2021-04-17: 30 mL

## 2021-04-17 MED ORDER — HYDROCODONE-ACETAMINOPHEN 5-325 MG PO TABS
1.0000 | ORAL_TABLET | ORAL | Status: DC | PRN
  Administered 2021-04-17: 1 via ORAL
  Administered 2021-04-18: 2 via ORAL
  Filled 2021-04-17: qty 2

## 2021-04-17 MED ORDER — OXYCODONE HCL 5 MG PO TABS
ORAL_TABLET | ORAL | Status: AC
  Filled 2021-04-17: qty 1

## 2021-04-17 MED ORDER — ONDANSETRON HCL 4 MG/2ML IJ SOLN
INTRAMUSCULAR | Status: AC
  Filled 2021-04-17: qty 2

## 2021-04-17 MED ORDER — DIPHENHYDRAMINE HCL 12.5 MG/5ML PO ELIX: 12.5000 mg | ORAL_SOLUTION | ORAL | Status: DC | PRN

## 2021-04-17 MED ORDER — LIDOCAINE 2% (20 MG/ML) 5 ML SYRINGE
INTRAMUSCULAR | Status: DC | PRN
  Administered 2021-04-17: 60 mg via INTRAVENOUS

## 2021-04-17 MED ORDER — PHENOL 1.4 % MT LIQD: 1.0000 | OROMUCOSAL | Status: DC | PRN

## 2021-04-17 MED ORDER — PHENYLEPHRINE 40 MCG/ML (10ML) SYRINGE FOR IV PUSH (FOR BLOOD PRESSURE SUPPORT)
PREFILLED_SYRINGE | INTRAVENOUS | Status: DC | PRN
  Administered 2021-04-17: 80 ug via INTRAVENOUS
  Administered 2021-04-17 (×2): 40 ug via INTRAVENOUS
  Administered 2021-04-17: 80 ug via INTRAVENOUS

## 2021-04-17 MED ORDER — MIDAZOLAM HCL 2 MG/2ML IJ SOLN
INTRAMUSCULAR | Status: AC
  Administered 2021-04-17: 1 mg via INTRAVENOUS
  Filled 2021-04-17: qty 2

## 2021-04-17 MED ORDER — METFORMIN HCL 500 MG PO TABS
250.0000 mg | ORAL_TABLET | Freq: Two times a day (BID) | ORAL | Status: DC
  Administered 2021-04-18: 250 mg via ORAL
  Filled 2021-04-17: qty 1

## 2021-04-17 MED ORDER — ALUM & MAG HYDROXIDE-SIMETH 200-200-20 MG/5ML PO SUSP: 30.0000 mL | ORAL | Status: DC | PRN

## 2021-04-17 MED ORDER — BUPIVACAINE-EPINEPHRINE (PF) 0.25% -1:200000 IJ SOLN
INTRAMUSCULAR | Status: DC | PRN
  Administered 2021-04-17: 30 mL

## 2021-04-17 MED ORDER — DEXAMETHASONE SODIUM PHOSPHATE 10 MG/ML IJ SOLN
INTRAMUSCULAR | Status: AC
  Filled 2021-04-17: qty 1

## 2021-04-17 MED ORDER — TRANEXAMIC ACID-NACL 1000-0.7 MG/100ML-% IV SOLN
INTRAVENOUS | Status: AC
  Filled 2021-04-17: qty 100

## 2021-04-17 MED ORDER — PANTOPRAZOLE SODIUM 40 MG PO TBEC
40.0000 mg | DELAYED_RELEASE_TABLET | Freq: Every evening | ORAL | Status: DC
  Administered 2021-04-17: 40 mg via ORAL
  Filled 2021-04-17: qty 1

## 2021-04-17 MED ORDER — SODIUM CHLORIDE (PF) 0.9 % IJ SOLN
INTRAMUSCULAR | Status: AC
  Filled 2021-04-17: qty 30

## 2021-04-17 MED ORDER — PROPOFOL 10 MG/ML IV BOLUS
INTRAVENOUS | Status: AC
  Filled 2021-04-17: qty 20

## 2021-04-17 MED ORDER — 0.9 % SODIUM CHLORIDE (POUR BTL) OPTIME
TOPICAL | Status: DC | PRN
  Administered 2021-04-17: 1000 mL

## 2021-04-17 MED ORDER — FENTANYL CITRATE (PF) 100 MCG/2ML IJ SOLN: 50.0000 ug | INTRAMUSCULAR | Status: DC

## 2021-04-17 MED ORDER — POVIDONE-IODINE 10 % EX SWAB
2.0000 "application " | Freq: Once | CUTANEOUS | Status: AC
  Administered 2021-04-17: 2 via TOPICAL

## 2021-04-17 MED ORDER — GABAPENTIN 300 MG PO CAPS
300.0000 mg | ORAL_CAPSULE | Freq: Every day | ORAL | Status: DC
  Administered 2021-04-17: 300 mg via ORAL
  Filled 2021-04-17: qty 1

## 2021-04-17 MED ORDER — APOAEQUORIN 10 MG PO CAPS: 10.0000 mg | ORAL_CAPSULE | Freq: Every day | ORAL | Status: DC

## 2021-04-17 MED ORDER — KETOROLAC TROMETHAMINE 15 MG/ML IJ SOLN
7.5000 mg | Freq: Four times a day (QID) | INTRAMUSCULAR | Status: AC
  Administered 2021-04-17 – 2021-04-18 (×3): 7.5 mg via INTRAVENOUS
  Filled 2021-04-17 (×3): qty 1

## 2021-04-17 MED ORDER — HYDROCODONE-ACETAMINOPHEN 5-325 MG PO TABS
ORAL_TABLET | ORAL | Status: AC
  Filled 2021-04-17: qty 1

## 2021-04-17 MED ORDER — METHOCARBAMOL 500 MG PO TABS
500.0000 mg | ORAL_TABLET | Freq: Four times a day (QID) | ORAL | Status: DC | PRN
  Administered 2021-04-18: 500 mg via ORAL
  Filled 2021-04-17: qty 1

## 2021-04-17 MED ORDER — LACTATED RINGERS IV BOLUS
250.0000 mL | Freq: Once | INTRAVENOUS | Status: AC
  Administered 2021-04-17: 250 mL via INTRAVENOUS

## 2021-04-17 MED ORDER — ACETAMINOPHEN 325 MG PO TABS: 325.0000 mg | ORAL_TABLET | Freq: Four times a day (QID) | ORAL | Status: DC | PRN

## 2021-04-17 MED ORDER — ASPIRIN 81 MG PO CHEW
81.0000 mg | CHEWABLE_TABLET | Freq: Two times a day (BID) | ORAL | Status: DC
  Administered 2021-04-18: 81 mg via ORAL
  Filled 2021-04-17: qty 1

## 2021-04-17 MED ORDER — ONDANSETRON HCL 4 MG/2ML IJ SOLN: 4.0000 mg | Freq: Four times a day (QID) | INTRAMUSCULAR | Status: DC | PRN

## 2021-04-17 MED ORDER — ONDANSETRON HCL 4 MG/2ML IJ SOLN
INTRAMUSCULAR | Status: DC | PRN
  Administered 2021-04-17: 4 mg via INTRAVENOUS

## 2021-04-17 MED ORDER — PHENYLEPHRINE 40 MCG/ML (10ML) SYRINGE FOR IV PUSH (FOR BLOOD PRESSURE SUPPORT)
PREFILLED_SYRINGE | INTRAVENOUS | Status: AC
  Filled 2021-04-17: qty 10

## 2021-04-17 MED ORDER — CEFAZOLIN SODIUM-DEXTROSE 2-4 GM/100ML-% IV SOLN
INTRAVENOUS | Status: AC
  Filled 2021-04-17: qty 100

## 2021-04-17 MED ORDER — TRANEXAMIC ACID-NACL 1000-0.7 MG/100ML-% IV SOLN
1000.0000 mg | INTRAVENOUS | Status: AC
  Administered 2021-04-17: 1000 mg via INTRAVENOUS
  Filled 2021-04-17: qty 100

## 2021-04-17 MED ORDER — SODIUM CHLORIDE 0.9% IV SOLUTION
INTRAVENOUS | Status: DC | PRN
  Administered 2021-04-17: 1000 mL

## 2021-04-17 MED ORDER — BUPIVACAINE HCL (PF) 0.5 % IJ SOLN
INTRAMUSCULAR | Status: DC | PRN
  Administered 2021-04-17: 20 mL via PERINEURAL

## 2021-04-17 MED ORDER — BUPIVACAINE-EPINEPHRINE (PF) 0.25% -1:200000 IJ SOLN
INTRAMUSCULAR | Status: AC
  Filled 2021-04-17: qty 30

## 2021-04-17 MED ORDER — PROPOFOL 10 MG/ML IV BOLUS
INTRAVENOUS | Status: DC | PRN
  Administered 2021-04-17: 140 mg via INTRAVENOUS

## 2021-04-17 MED ORDER — FENTANYL CITRATE (PF) 100 MCG/2ML IJ SOLN
INTRAMUSCULAR | Status: DC | PRN
  Administered 2021-04-17 (×6): 25 ug via INTRAVENOUS

## 2021-04-17 MED ORDER — GLIMEPIRIDE 1 MG PO TABS
1.0000 mg | ORAL_TABLET | Freq: Every day | ORAL | Status: DC
  Administered 2021-04-18: 1 mg via ORAL
  Filled 2021-04-17: qty 1

## 2021-04-17 MED ORDER — EPHEDRINE SULFATE-NACL 50-0.9 MG/10ML-% IV SOSY
PREFILLED_SYRINGE | INTRAVENOUS | Status: DC | PRN
  Administered 2021-04-17 (×3): 5 mg via INTRAVENOUS

## 2021-04-17 MED ORDER — HYDROCODONE-ACETAMINOPHEN 7.5-325 MG PO TABS
1.0000 | ORAL_TABLET | ORAL | Status: DC | PRN
  Administered 2021-04-18: 1 via ORAL
  Filled 2021-04-17: qty 1

## 2021-04-17 MED ORDER — BISACODYL 5 MG PO TBEC: 5.0000 mg | DELAYED_RELEASE_TABLET | Freq: Every day | ORAL | Status: DC | PRN

## 2021-04-17 MED ORDER — FENTANYL CITRATE (PF) 100 MCG/2ML IJ SOLN
INTRAMUSCULAR | Status: AC
  Administered 2021-04-17: 50 ug via INTRAVENOUS
  Filled 2021-04-17: qty 2

## 2021-04-17 MED ORDER — ATORVASTATIN CALCIUM 40 MG PO TABS
40.0000 mg | ORAL_TABLET | Freq: Every day | ORAL | Status: DC
  Administered 2021-04-18: 40 mg via ORAL
  Filled 2021-04-17: qty 1

## 2021-04-17 MED ORDER — METOPROLOL TARTRATE 50 MG PO TABS
50.0000 mg | ORAL_TABLET | Freq: Every evening | ORAL | Status: DC
  Administered 2021-04-17: 50 mg via ORAL
  Filled 2021-04-17: qty 1

## 2021-04-17 MED ORDER — ASPIRIN EC 81 MG PO TBEC: 81.0000 mg | DELAYED_RELEASE_TABLET | Freq: Two times a day (BID) | ORAL | 2 refills | Status: AC

## 2021-04-17 MED ORDER — ACETAMINOPHEN 500 MG PO TABS
1000.0000 mg | ORAL_TABLET | Freq: Once | ORAL | Status: AC
  Administered 2021-04-17: 1000 mg via ORAL
  Filled 2021-04-17: qty 2

## 2021-04-17 MED ORDER — METOCLOPRAMIDE HCL 5 MG/ML IJ SOLN
5.0000 mg | Freq: Three times a day (TID) | INTRAMUSCULAR | Status: DC | PRN
Start: 2021-04-17 — End: 2021-04-18

## 2021-04-17 MED ORDER — MENTHOL 3 MG MT LOZG: 1.0000 | LOZENGE | OROMUCOSAL | Status: DC | PRN

## 2021-04-17 MED ORDER — OXYCODONE HCL 5 MG/5ML PO SOLN: 5.0000 mg | Freq: Once | ORAL | Status: AC | PRN

## 2021-04-17 MED ORDER — LACTATED RINGERS IV SOLN: INTRAVENOUS | Status: DC

## 2021-04-17 MED ORDER — FENTANYL CITRATE (PF) 100 MCG/2ML IJ SOLN
25.0000 ug | INTRAMUSCULAR | Status: DC | PRN
  Administered 2021-04-17 (×2): 50 ug via INTRAVENOUS

## 2021-04-17 MED ORDER — PROMETHAZINE HCL 25 MG/ML IJ SOLN: 6.2500 mg | INTRAMUSCULAR | Status: DC | PRN

## 2021-04-17 MED ORDER — AMLODIPINE BESYLATE 5 MG PO TABS
5.0000 mg | ORAL_TABLET | Freq: Every day | ORAL | Status: DC
  Administered 2021-04-18: 5 mg via ORAL
  Filled 2021-04-17: qty 1

## 2021-04-17 MED ORDER — ACETAMINOPHEN 500 MG PO TABS
500.0000 mg | ORAL_TABLET | Freq: Four times a day (QID) | ORAL | Status: AC
  Administered 2021-04-17 – 2021-04-18 (×3): 500 mg via ORAL
  Filled 2021-04-17 (×3): qty 1

## 2021-04-17 MED ORDER — HYDROCODONE-ACETAMINOPHEN 5-325 MG PO TABS: 1.0000 | ORAL_TABLET | Freq: Four times a day (QID) | ORAL | 0 refills | Status: AC | PRN

## 2021-04-17 MED ORDER — CEFAZOLIN SODIUM-DEXTROSE 2-4 GM/100ML-% IV SOLN
2.0000 g | INTRAVENOUS | Status: AC
  Administered 2021-04-17: 2 g via INTRAVENOUS
  Filled 2021-04-17: qty 100

## 2021-04-17 MED ORDER — BUPIVACAINE LIPOSOME 1.3 % IJ SUSP
INTRAMUSCULAR | Status: DC | PRN
  Administered 2021-04-17: 20 mL

## 2021-04-17 MED ORDER — METHOCARBAMOL 500 MG IVPB - SIMPLE MED
INTRAVENOUS | Status: AC
  Filled 2021-04-17: qty 50

## 2021-04-17 MED ORDER — LIDOCAINE 2% (20 MG/ML) 5 ML SYRINGE
INTRAMUSCULAR | Status: AC
  Filled 2021-04-17: qty 5

## 2021-04-17 MED ORDER — METHOCARBAMOL 500 MG IVPB - SIMPLE MED
500.0000 mg | Freq: Four times a day (QID) | INTRAVENOUS | Status: DC | PRN
  Administered 2021-04-17: 500 mg via INTRAVENOUS
  Filled 2021-04-17: qty 50

## 2021-04-17 MED ORDER — DOCUSATE SODIUM 100 MG PO CAPS
100.0000 mg | ORAL_CAPSULE | Freq: Two times a day (BID) | ORAL | Status: DC
  Administered 2021-04-17 – 2021-04-18 (×2): 100 mg via ORAL
  Filled 2021-04-17 (×2): qty 1

## 2021-04-17 MED ORDER — DEXAMETHASONE SODIUM PHOSPHATE 10 MG/ML IJ SOLN
INTRAMUSCULAR | Status: DC | PRN
  Administered 2021-04-17: 5 mg via INTRAVENOUS

## 2021-04-17 MED ORDER — LACTATED RINGERS IV BOLUS
500.0000 mL | Freq: Once | INTRAVENOUS | Status: AC
  Administered 2021-04-17: 500 mL via INTRAVENOUS

## 2021-04-17 MED ORDER — TIZANIDINE HCL 4 MG PO TABS: 4.0000 mg | ORAL_TABLET | Freq: Four times a day (QID) | ORAL | 1 refills | Status: AC | PRN

## 2021-04-17 MED ORDER — MORPHINE SULFATE (PF) 4 MG/ML IV SOLN
0.5000 mg | INTRAVENOUS | Status: DC | PRN
Start: 2021-04-17 — End: 2021-04-18

## 2021-04-17 MED ORDER — METOCLOPRAMIDE HCL 5 MG PO TABS
5.0000 mg | ORAL_TABLET | Freq: Three times a day (TID) | ORAL | Status: DC | PRN
  Filled 2021-04-17: qty 2

## 2021-04-17 MED ORDER — OXYCODONE HCL 5 MG PO TABS
5.0000 mg | ORAL_TABLET | Freq: Once | ORAL | Status: AC | PRN
  Administered 2021-04-17: 5 mg via ORAL

## 2021-04-17 SURGICAL SUPPLY — 54 items
ATTUNE PSFEM LTSZ5 NARCEM KNEE (Femur) ×2 IMPLANT
ATTUNE PSRP INSR SZ 5 10M KNEE (Insert) ×2 IMPLANT
BAG DECANTER FOR FLEXI CONT (MISCELLANEOUS) ×2 IMPLANT
BAG SPEC THK2 15X12 ZIP CLS (MISCELLANEOUS) ×1
BAG ZIPLOCK 12X15 (MISCELLANEOUS) ×2 IMPLANT
BASEPLATE TIBIAL ROTATING SZ 4 (Knees) ×2 IMPLANT
BLADE SAGITTAL 25.0X1.19X90 (BLADE) ×2 IMPLANT
BLADE SAW SGTL 11.0X1.19X90.0M (BLADE) ×2 IMPLANT
BNDG ELASTIC 6X5.8 VLCR STR LF (GAUZE/BANDAGES/DRESSINGS) ×2 IMPLANT
BOOTIES KNEE HIGH SLOAN (MISCELLANEOUS) ×2 IMPLANT
BOWL SMART MIX CTS (DISPOSABLE) ×2 IMPLANT
BSPLAT TIB 4 CMNT ROT PLAT STR (Knees) ×1 IMPLANT
CEMENT HV SMART SET (Cement) ×4 IMPLANT
COVER SURGICAL LIGHT HANDLE (MISCELLANEOUS) ×2 IMPLANT
COVER WAND RF STERILE (DRAPES) ×2 IMPLANT
CUFF TOURN SGL QUICK 34 (TOURNIQUET CUFF) ×2
CUFF TRNQT CYL 34X4.125X (TOURNIQUET CUFF) ×1 IMPLANT
DECANTER SPIKE VIAL GLASS SM (MISCELLANEOUS) ×4 IMPLANT
DRAPE ORTHO SPLIT 77X108 STRL (DRAPES)
DRAPE SHEET LG 3/4 BI-LAMINATE (DRAPES) ×2 IMPLANT
DRAPE SURG ORHT 6 SPLT 77X108 (DRAPES) IMPLANT
DRAPE TOP 10253 STERILE (DRAPES) ×2 IMPLANT
DRAPE U-SHAPE 47X51 STRL (DRAPES) ×2 IMPLANT
DRSG AQUACEL AG ADV 3.5X10 (GAUZE/BANDAGES/DRESSINGS) ×2 IMPLANT
DURAPREP 26ML APPLICATOR (WOUND CARE) ×4 IMPLANT
ELECT REM PT RETURN 15FT ADLT (MISCELLANEOUS) ×2 IMPLANT
GLOVE SRG 8 PF TXTR STRL LF DI (GLOVE) ×2 IMPLANT
GLOVE SURG ENC MOIS LTX SZ8 (GLOVE) ×4 IMPLANT
GLOVE SURG UNDER POLY LF SZ8 (GLOVE) ×4
GOWN STRL REUS W/TWL XL LVL3 (GOWN DISPOSABLE) ×4 IMPLANT
HANDPIECE INTERPULSE COAX TIP (DISPOSABLE) ×2
HOLDER FOLEY CATH W/STRAP (MISCELLANEOUS) IMPLANT
HOOD PEEL AWAY FLYTE STAYCOOL (MISCELLANEOUS) ×6 IMPLANT
KIT TURNOVER KIT A (KITS) ×2 IMPLANT
MANIFOLD NEPTUNE II (INSTRUMENTS) ×2 IMPLANT
NEEDLE HYPO 22GX1.5 SAFETY (NEEDLE) ×4 IMPLANT
NS IRRIG 1000ML POUR BTL (IV SOLUTION) ×2 IMPLANT
PACK TOTAL KNEE CUSTOM (KITS) ×2 IMPLANT
PAD ARMBOARD 7.5X6 YLW CONV (MISCELLANEOUS) ×2 IMPLANT
PATELLA MEDIAL ATTUN 35MM KNEE (Knees) ×2 IMPLANT
PENCIL SMOKE EVACUATOR (MISCELLANEOUS) IMPLANT
PIN DRILL FIX HALF THREAD (BIT) ×2 IMPLANT
PIN STEINMAN FIXATION KNEE (PIN) ×2 IMPLANT
PROTECTOR NERVE ULNAR (MISCELLANEOUS) ×2 IMPLANT
SET HNDPC FAN SPRY TIP SCT (DISPOSABLE) ×1 IMPLANT
SUT ETHIBOND NAB CT1 #1 30IN (SUTURE) ×4 IMPLANT
SUT VIC AB 0 CT1 36 (SUTURE) ×2 IMPLANT
SUT VIC AB 2-0 CT1 27 (SUTURE) ×2
SUT VIC AB 2-0 CT1 TAPERPNT 27 (SUTURE) ×1 IMPLANT
SUT VICRYL AB 3-0 FS1 BRD 27IN (SUTURE) ×2 IMPLANT
SUT VLOC 180 0 24IN GS25 (SUTURE) ×2 IMPLANT
TRAY FOLEY MTR SLVR 16FR STAT (SET/KITS/TRAYS/PACK) IMPLANT
WATER STERILE IRR 1000ML POUR (IV SOLUTION) ×2 IMPLANT
WRAP KNEE MAXI GEL POST OP (GAUZE/BANDAGES/DRESSINGS) ×2 IMPLANT

## 2021-04-17 NOTE — Evaluation (Signed)
Physical Therapy Evaluation Patient Details Name: Felicia Shelton MRN: [id number] DOB: Jan 03, 1941 Today's Date: 04/17/2021   History of Present Illness  s/p L TKA PMH: HTN, DM, neuromuscular d/o, L2-5 decompression and fusion  Clinical Impression  Pt is s/p TKA resulting in the deficits listed below (see PT Problem List).  Pt is very pleasant and motivated. Able to amb 30-35' x2 with RW and min assist for balance and safety. Pt requires repetitious multi-modal cues for RW safety and sequencing of gait task (she is used to a rollator). Pt has ~7 steps to enter her home, unable to complete today d/t fatigue and pain.  Feel pt is a fall risk and would benefit from overnight stay with 1-2 sessions of PT tomorrow for safe d/c home with her husband  Pt will benefit from skilled PT to increase their independence and safety with mobility to allow discharge to the venue listed below.      Follow Up Recommendations Follow surgeon's recommendation for DC plan and follow-up therapies    Equipment Recommendations  Rolling walker with 5" wheels    Recommendations for Other Services       Precautions / Restrictions Precautions Precautions: Fall;Knee Required Braces or Orthoses: Knee Immobilizer - Left Restrictions Weight Bearing Restrictions: No Other Position/Activity Restrictions: WBAT      Mobility  Bed Mobility Overal bed mobility: Needs Assistance Bed Mobility: Supine to Sit     Supine to sit: Min assist;Min guard     General bed mobility comments: min/guard for safety to sit EOB, min assist with lift LLE on ot bed, incr time    Transfers Overall transfer level: Needs assistance Equipment used: Rolling walker (2 wheeled) Transfers: Sit to/from Stand Sit to Stand: Min assist         General transfer comment: assist to rise and transition to RW, cues for hand placement and to power up with LLE >RLE  Ambulation/Gait Ambulation/Gait assistance: Min assist Gait Distance (Feet):  60 Feet Assistive device: Rolling walker (2 wheeled) Gait Pattern/deviations: Step-to pattern;Decreased stance time - left Gait velocity: decr   General Gait Details: multi-modal cues for RW position, step length and sequence. assist to balance and maneuver RW  Stairs            Wheelchair Mobility    Modified Rankin (Stroke Patients Only)       Balance Overall balance assessment: Needs assistance Sitting-balance support: No upper extremity supported;Feet unsupported Sitting balance-Leahy Scale: Good       Standing balance-Leahy Scale: Fair Standing balance comment: reliant on UEs for amb, briefly able to maintain standing without UE support and wash hands with close supervision                             Pertinent Vitals/Pain Pain Assessment: 0-10 Pain Score: 4  Pain Location: L knee Pain Descriptors / Indicators: Aching;Sore Pain Intervention(s): Premedicated before session;Monitored during session;Limited activity within patient's tolerance    Home Living Family/patient expects to be discharged to:: Private residence Living Arrangements: Spouse/significant other Available Help at Discharge: Family;Available 24 hours/day Type of Home: House Home Access: Stairs to enter   [employer] of Steps: 6 Home Layout: Multi-level Home Equipment: Walker - 4 wheels, cane      Prior Function Level of Independence: Independent               Hand Dominance        Extremity/Trunk Assessment  Upper Extremity Assessment Upper Extremity Assessment: Overall WFL for tasks assessed    Lower Extremity Assessment Lower Extremity Assessment: LLE deficits/detail LLE Deficits / Details: ankle WFL, knee extension and hip flexion ~2+/5 with ~ 6 degree quad lag       Communication   Communication: No difficulties  Cognition Arousal/Alertness: Awake/alert Behavior During Therapy: WFL for tasks assessed/performed Overall Cognitive Status:  Within Functional Limits for tasks assessed                                        General Comments      Exercises Total Joint Exercises Ankle Circles/Pumps: AROM;Both;10 reps Quad Sets: 10 reps;AROM;Both Heel Slides: AAROM;5 reps;Left Straight Leg Raises: AAROM;Left;5 reps   Assessment/Plan    PT Assessment Patient needs continued PT services  PT Problem List Decreased strength;Decreased mobility;Decreased activity tolerance;Decreased knowledge of use of DME;Pain;Decreased range of motion       PT Treatment Interventions DME instruction;Therapeutic exercise;Gait training;Functional mobility training;Therapeutic activities;Patient/family education;Stair training    PT Goals (Current goals can be found in the Care Plan section)  Acute Rehab PT Goals Patient Stated Goal: walk easier PT Goal Formulation: With patient Time For Goal Achievement: 04/24/21 Potential to Achieve Goals: Good    Frequency 7X/week   Barriers to discharge        Co-evaluation               AM-PAC PT "6 Clicks" Mobility  Outcome Measure Help needed turning from your back to your side while in a flat bed without using bedrails?: A Little Help needed moving from lying on your back to sitting on the side of a flat bed without using bedrails?: A Little Help needed moving to and from a bed to a chair (including a wheelchair)?: A Little Help needed standing up from a chair using your arms (e.g., wheelchair or bedside chair)?: A Little Help needed to walk in hospital room?: A Lot Help needed climbing 3-5 steps with a railing? : A Lot 6 Click Score: 16    End of Session Equipment Utilized During Treatment: Gait belt Activity Tolerance: Patient tolerated treatment well;Patient limited by fatigue Patient left: with call bell/phone within reach;in bed Nurse Communication: Mobility status PT Visit Diagnosis: Other abnormalities of gait and mobility (R26.89);Difficulty in walking, not  elsewhere classified (R26.2)    Time: 1521-1600 PT Time Calculation (min) (ACUTE ONLY): 39 min   Charges:   PT Evaluation $PT Eval Low Complexity: 1 Low PT Treatments $Gait Training: 8-22 mins $Therapeutic Exercise: 8-22 mins        Baxter Flattery, PT  Acute Rehab Dept ([place]) [phone] Pager [phone]  04/17/2021   [facility] 04/17/2021, 4:14 PM

## 2021-04-17 NOTE — Anesthesia Preprocedure Evaluation (Addendum)
Anesthesia Evaluation  Patient identified by MRN, date of birth, ID band Patient awake    Reviewed: Allergy & Precautions, NPO status , Patient's Chart, lab work & pertinent test results  Airway Mallampati: II  TM Distance: >3 FB Neck ROM: Full    Dental no notable dental hx.    Pulmonary former smoker,    Pulmonary exam normal breath sounds clear to auscultation       Cardiovascular hypertension, Normal cardiovascular exam Rhythm:Regular Rate:Normal  04/17/21 EKG: Normal sinus rhythm Normal ECG   Neuro/Psych  Neuromuscular disease (radiculopathy)    GI/Hepatic GERD  ,Colon polyps   Endo/Other  diabetes, Type 2  Renal/GU      Musculoskeletal  (+) Arthritis ,   Abdominal   Peds  Hematology negative hematology ROS (+)   Anesthesia Other Findings   Reproductive/Obstetrics                            Anesthesia Physical Anesthesia Plan  ASA: III  Anesthesia Plan: MAC, Regional and Spinal   Post-op Pain Management:  Regional for Post-op pain   Induction: Intravenous  PONV Risk Score and Plan: 2 and TIVA, Treatment may vary due to age or medical condition, Propofol infusion and Ondansetron  Airway Management Planned: Natural Airway and Simple Face Mask  Additional Equipment: None  Intra-op Plan:   Post-operative Plan:   Informed Consent: I have reviewed the patients History and Physical, chart, labs and discussed the procedure including the risks, benefits and alternatives for the proposed anesthesia with the patient or authorized representative who has indicated his/her understanding and acceptance.     Dental advisory given  Plan Discussed with: CRNA, Anesthesiologist and Surgeon  Anesthesia Plan Comments: (Adductor block. Spinal. GA/LMA as backup. )       Anesthesia Quick Evaluation

## 2021-04-17 NOTE — Anesthesia Procedure Notes (Signed)
Anesthesia Regional Block: Adductor canal block   Pre-Anesthetic Checklist: ,, timeout performed, Correct Patient, Correct Site, Correct Laterality, Correct Procedure, Correct Position, site marked, Risks and benefits discussed,  Surgical consent,  Pre-op evaluation,  At surgeon's request and post-op pain management  Laterality: Left  Prep: chloraprep       Needles:  Injection technique: Single-shot  Needle Type: Echogenic Stimulator Needle     Needle Length: 10cm  Needle Gauge: 20     Additional Needles:   Procedures:,,,, ultrasound used (permanent image in chart),,,,  Narrative:  Start time: 04/17/2021 8:30 AM End time: 04/17/2021 8:35 AM Injection made incrementally with aspirations every 5 mL.  Performed by: Personally  Anesthesiologist: Merlinda Frederick, MD  Additional Notes: A functioning IV was confirmed and monitors were applied.  Sterile prep and drape, hand hygiene and sterile gloves were used.  Negative aspiration and test dose prior to incremental administration of local anesthetic. The patient tolerated the procedure well.Ultrasound  guidance: relevant anatomy identified, needle position confirmed, local anesthetic spread visualized around nerve(s), vascular puncture avoided.  Image printed for medical record.

## 2021-04-17 NOTE — Interval H&P Note (Signed)
History and Physical Interval Note:  04/17/2021 9:14 AM  Felicia Shelton  has presented today for surgery, with the diagnosis of LEFT KNEE DEGENERATIVE JOINT DISEASE.  The various methods of treatment have been discussed with the patient and family. After consideration of risks, benefits and other options for treatment, the patient has consented to  Procedure(s): LEFT TOTAL KNEE ARTHROPLASTY (Left) as a surgical intervention.  The patient's history has been reviewed, patient examined, no change in status, stable for surgery.  I have reviewed the patient's chart and labs.  Questions were answered to the patient's satisfaction.     Hessie Dibble

## 2021-04-17 NOTE — Anesthesia Procedure Notes (Signed)
Procedure Name: LMA Insertion Date/Time: 04/17/2021 10:35 AM Performed by: Gwyndolyn Saxon, CRNA Pre-anesthesia Checklist: Patient identified, Emergency Drugs available, Suction available and Patient being monitored Patient Re-evaluated:Patient Re-evaluated prior to induction Oxygen Delivery Method: Circle system utilized Preoxygenation: Pre-oxygenation with 100% oxygen Induction Type: IV induction Ventilation: Mask ventilation without difficulty LMA: LMA inserted LMA Size: 4.0 Number of attempts: 1 Placement Confirmation: positive ETCO2 and breath sounds checked- equal and bilateral Tube secured with: Tape Dental Injury: Teeth and Oropharynx as per pre-operative assessment

## 2021-04-17 NOTE — Transfer of Care (Signed)
Immediate Anesthesia Transfer of Care Note  Patient: Felicia Shelton  Procedure(s) Performed: LEFT TOTAL KNEE ARTHROPLASTY (Left Knee)  Patient Location: PACU  Anesthesia Type:General and Regional  Level of Consciousness: drowsy and patient cooperative  Airway & Oxygen Therapy: Patient Spontanous Breathing and Patient connected to face mask oxygen  Post-op Assessment: Report given to RN and Post -op Vital signs reviewed and stable  Post vital signs: Reviewed and stable  Last Vitals:  Vitals Value Taken Time  BP 142/72 04/17/21 1221  Temp    Pulse 68 04/17/21 1224  Resp 15 04/17/21 1224  SpO2 98 % 04/17/21 1224  Vitals shown include unvalidated device data.  Last Pain:  Vitals:   04/17/21 0747  TempSrc: Oral         Complications: No complications documented.

## 2021-04-17 NOTE — Plan of Care (Signed)
  Problem: Education: Goal: Knowledge of the prescribed therapeutic regimen will improve Outcome: Progressing   Problem: Activity: Goal: Ability to avoid complications of mobility impairment will improve Outcome: Progressing   Problem: Pain Management: Goal: Pain level will decrease with appropriate interventions Outcome: Progressing   

## 2021-04-17 NOTE — Anesthesia Postprocedure Evaluation (Signed)
Anesthesia Post Note  Patient: Felicia Shelton  Procedure(s) Performed: LEFT TOTAL KNEE ARTHROPLASTY (Left Knee)     Patient location during evaluation: PACU Anesthesia Type: Regional and General Level of consciousness: awake and alert Pain management: pain level controlled Vital Signs Assessment: post-procedure vital signs reviewed and stable Respiratory status: spontaneous breathing, nonlabored ventilation, respiratory function stable and patient connected to nasal cannula oxygen Cardiovascular status: blood pressure returned to baseline and stable Postop Assessment: no apparent nausea or vomiting Anesthetic complications: no   No complications documented.  Last Vitals:  Vitals:   04/17/21 1345 04/17/21 1350  BP: (!) 152/66   Pulse: 75   Resp: 14 16  Temp: 36.7 C   SpO2: 98%     Last Pain:  Vitals:   04/17/21 1350  TempSrc:   PainSc: 5                  Candra R Nikeya Maxim

## 2021-04-17 NOTE — Op Note (Signed)
PREOP DIAGNOSIS: DJD LEFT KNEE POSTOP DIAGNOSIS:  same PROCEDURE: LEFT TKR ANESTHESIA: General ATTENDING SURGEON: Hessie Dibble ASSISTANT: Loni Dolly PA  INDICATIONS FOR PROCEDURE: Felicia Shelton is a 80 y.o. female who has struggled for a long time with pain due to degenerative arthritis of the left knee.  The patient has failed many conservative non-operative measures and at this point has pain which limits the ability to sleep and walk.  The patient is offered total knee replacement.  Informed operative consent was obtained after discussion of possible risks of anesthesia, infection, neurovascular injury, DVT, and death.  The importance of the post-operative rehabilitation protocol to optimize result was stressed extensively with the patient.  SUMMARY OF FINDINGS AND PROCEDURE:  Felicia Shelton was taken to the operative suite where under the above anesthesia a left knee replacement was performed.  There were advanced degenerative changes and the bone quality was fair.  We used the DePuyAttune system and placed size 5 narrow femur, 4  tibia, 35 mm all polyethylene patella, and a size 10 mm spacer.  Loni Dolly PA-C assisted throughout and was invaluable to the completion of the case in that he helped retract and maintain exposure while I placed the components.  He also helped close thereby minimizing OR time.  The patient was admitted for appropriate post-op care to include perioperative antibiotics and mechanical and pharmacologic measures for DVT prophylaxis.  DESCRIPTION OF PROCEDURE:  Felicia Shelton was taken to the operative suite where the above anesthesia was applied.  The patient was positioned supine and prepped and draped in normal sterile fashion.  An appropriate time out was performed.  After the administration of kefzol pre-op antibiotic the leg was elevated and exsanguinated and a tourniquet inflated.  A standard longitudinal incision was made on the anterior knee.  Dissection was carried down  to the extensor mechanism.  All appropriate anti-infective measures were used including the pre-operative antibiotic, betadine impregnated drape, and closed hooded exhaust systems for each member of the surgical team.  A medial parapatellar incision was made in the extensor mechanism and the knee cap flipped and the knee flexed.  Some residual meniscal tissues were removed along with any remaining ACL/PCL tissue.  A guide was placed on the tibia and a flat cut was made on it's superior surface.  An intramedullary guide was placed in the femur and was utilized to make anterior and posterior cuts creating an appropriate flexion gap.  A second intramedullary guide was placed in the femur to make a distal cut properly balancing the knee with an extension gap equal to the flexion gap.  The three bones sized to the above mentioned sizes and the appropriate guides were placed and utilized.  A trial reduction was done and the knee easily came to full extension and the patella tracked well on flexion.  The trial components were removed and all bones were cleaned with pulsatile lavage and then dried thoroughly.  Cement was mixed and was pressurized onto the bones followed by placement of the aforementioned components.  Excess cement was trimmed and pressure was held on the components until the cement had hardened.  The tourniquet was deflated and a small amount of bleeding was controlled with cautery and pressure.  The knee was irrigated thoroughly.  The extensor mechanism was re-approximated with #1 ethibond in interrupted fashion.  The knee was flexed and the repair was solid.  The subcutaneous tissues were re-approximated with #0 and #2-0 vicryl and the skin  closed with a subcuticular stitch and steristrips.  A sterile dressing was applied.  Intraoperative fluids, EBL, and tourniquet time can be obtained from anesthesia records.  DISPOSITION:  The patient was taken to recovery room in stable condition and admitted for  appropriate post-op care to include peri-operative antibiotic and DVT prophylaxis with mechanical and pharmacologic measures.  Hessie Dibble 04/17/2021, 12:02 PM

## 2021-04-17 NOTE — Progress Notes (Signed)
AssistedDr. Elgie Congo with left, ultrasound guided, adductor canal block. Side rails up, monitors on throughout procedure. See vital signs in flow sheet. Tolerated Procedure well.

## 2021-04-18 ENCOUNTER — Encounter (HOSPITAL_COMMUNITY): Payer: Self-pay | Admitting: Orthopaedic Surgery

## 2021-04-18 DIAGNOSIS — M1712 Unilateral primary osteoarthritis, left knee: Secondary | ICD-10-CM | POA: Diagnosis not present

## 2021-04-18 NOTE — TOC Transition Note (Signed)
Transition of Care [facility]) - CM/SW Discharge Note   Patient Details  Name: Felicia Shelton MRN: [id number] Date of Birth: 02-09-1941  [facility] CM/SW Contact:  Lennart Pall, LCSW Phone Number: 04/18/2021, 11:08 AM   Clinical Narrative:    Met briefly with pt and confirming need for rolling walker - order sent to [employer].  HHPT already arranged with [facility].  No further TOC needs.   Final next level of care: [place] Barriers to Discharge: No Barriers Identified   Patient Goals and CMS Choice Patient states their goals for this hospitalization and ongoing recovery are:: return home      Discharge Placement                       Discharge Plan and Services                DME Arranged: Walker rolling DME Agency: [employer] Date DME Agency Contacted: 04/18/21 Time DME Agency Contacted: 0930 Representative spoke with at DME Agency: Freda Munro HH Arranged: PT [place]: [place] Date [facility] Agency Contacted:  (arranged prior to surgery)      Social Determinants of Health (SDOH) Interventions     Readmission Risk Interventions No flowsheet data found.

## 2021-04-18 NOTE — Progress Notes (Signed)
Physical Therapy Treatment Patient Details Name: Felicia Shelton MRN: [id number] DOB: 06-26-1941 Today's Date: 04/18/2021    History of Present Illness s/p L TKA PMH: HTN, DM, neuromuscular d/o, L2-5 decompression and fusion    PT Comments    Pt  Progressing with PT however continues to require cues for safety and intermittent assist with balance. Will see for a second session  Follow Up Recommendations   follow surgeon's rec     Equipment Recommendations  Rolling walker with 5" wheels    Recommendations for Other Services       Precautions / Restrictions Precautions Precautions: Fall;Knee Restrictions Weight Bearing Restrictions: No Other Position/Activity Restrictions: WBAT    Mobility  Bed Mobility   Bed Mobility: Supine to Sit     Supine to sit: Min guard     General bed mobility comments: min/guard for safety to sit EOB    Transfers Overall transfer level: Needs assistance Equipment used: Rolling walker (2 wheeled) Transfers: Sit to/from Stand Sit to Stand: Min assist         General transfer comment: cues for safety and hand placement  Ambulation/Gait Ambulation/Gait assistance: Min guard;Min assist Gait Distance (Feet): 70 Feet Assistive device: Rolling walker (2 wheeled) Gait Pattern/deviations: Step-to pattern;Decreased stance time - left     General Gait Details: multi-modal cues for RW position, step length and sequence. assist to balance and maneuver RW   Stairs Stairs: Yes   Stair Management: One rail Right;One rail Left;Step to pattern;Sideways Number of Stairs: 3 General stair comments: multi-modal cues for sequence and safe technique   Wheelchair Mobility    Modified Rankin (Stroke Patients Only)       Balance             Standing balance-Leahy Scale: Fair                              Cognition Arousal/Alertness: Awake/alert Behavior During Therapy: WFL for tasks assessed/performed Overall Cognitive  Status: Within Functional Limits for tasks assessed                                        Exercises Total Joint Exercises Ankle Circles/Pumps: AROM;Both;10 reps Quad Sets: 10 reps;AROM;Both Heel Slides: AAROM;Left;10 reps    General Comments        Pertinent Vitals/Pain Pain Assessment: 0-10 Pain Score: 3  Pain Location: L knee Pain Descriptors / Indicators: Aching;Sore Pain Intervention(s): Limited activity within patient's tolerance;Monitored during session;Repositioned    Home Living                      Prior Function            PT Goals (current goals can now be found in the care plan section) Acute Rehab PT Goals Patient Stated Goal: walk easier PT Goal Formulation: With patient Time For Goal Achievement: 04/24/21 Potential to Achieve Goals: Good Progress towards PT goals: Progressing toward goals    Frequency    7X/week      PT Plan Current plan remains appropriate    Co-evaluation              AM-PAC PT "6 Clicks" Mobility   Outcome Measure  Help needed turning from your back to your side while in a flat bed without using bedrails?: A Little Help needed moving  from lying on your back to sitting on the side of a flat bed without using bedrails?: A Little Help needed moving to and from a bed to a chair (including a wheelchair)?: A Little Help needed standing up from a chair using your arms (e.g., wheelchair or bedside chair)?: A Little Help needed to walk in hospital room?: A Little Help needed climbing 3-5 steps with a railing? : A Little 6 Click Score: 18    End of Session Equipment Utilized During Treatment: Gait belt Activity Tolerance: Patient tolerated treatment well Patient left: with call bell/phone within reach;in bed Nurse Communication: Mobility status PT Visit Diagnosis: Other abnormalities of gait and mobility (R26.89);Difficulty in walking, not elsewhere classified (R26.2)     Time: 3533-1740 PT  Time Calculation (min) (ACUTE ONLY): 23 min  Charges:  $Gait Training: 8-22 mins                     Baxter Flattery, PT  Acute Rehab Dept ([place]) [phone] Pager [phone]  04/18/2021    [facility] 04/18/2021, 11:03 AM

## 2021-04-18 NOTE — Discharge Summary (Signed)
Patient ID: Felicia Shelton MRN: [id number] DOB/AGE: 1941-07-02 80 y.o.  Admit date: 04/17/2021 Discharge date: 04/18/2021  Admission Diagnoses:  Principal Problem:   Primary osteoarthritis of left knee Active Problems:   Primary localized osteoarthritis of left knee   Discharge Diagnoses:  Same  Past Medical History:  Diagnosis Date  . Allergy   . Arthritis    back  . Diabetes mellitus without complication ([place])    type 2   . GERD (gastroesophageal reflux disease)   . Hx of adenomatous colonic polyp 05/26/2008  . Hyperlipidemia   . Hypertension   . Neuromuscular disorder ([place])     Surgeries: Procedure(s): LEFT TOTAL KNEE ARTHROPLASTY on 04/17/2021   Consultants:   Discharged Condition: Improved  Hospital Course: BRYANNA YIM is an 80 y.o. female who was admitted 04/17/2021 for operative treatment ofPrimary osteoarthritis of left knee. Patient has severe unremitting pain that affects sleep, daily activities, and work/hobbies. After pre-op clearance the patient was taken to the operating room on 04/17/2021 and underwent  Procedure(s): LEFT TOTAL KNEE ARTHROPLASTY.    Patient was given perioperative antibiotics:  Anti-infectives (From admission, onward)   Start     Dose/Rate Route Frequency Ordered Stop   04/17/21 1630  ceFAZolin (ANCEF) IVPB 2g/100 mL premix        2 g 200 mL/hr over 30 Minutes Intravenous Every 6 hours 04/17/21 1227 04/18/21 0429   04/17/21 1614  ceFAZolin (ANCEF) 2-4 GM/100ML-% IVPB       Note to Pharmacy: Lanell Persons  : cabinet override      04/17/21 1614 04/18/21 0429   04/17/21 0800  ceFAZolin (ANCEF) IVPB 2g/100 mL premix        2 g 200 mL/hr over 30 Minutes Intravenous On call to O.R. 04/17/21 0745 04/17/21 1105       Patient was given sequential compression devices, early ambulation, and chemoprophylaxis to prevent DVT.  Patient benefited maximally from hospital stay and there were no complications.    Recent vital signs:  Patient Vitals  for the past 24 hrs:  BP Temp Temp src Pulse Resp SpO2  04/18/21 0459 (!) 147/67 98 F (36.7 C) Oral 76 16 100 %  04/18/21 0119 (!) 155/96 98 F (36.7 C) Oral 81 16 98 %  04/17/21 2022 (!) 157/83 (!) 97.5 F (36.4 C) -- 86 18 97 %  04/17/21 1858 (!) 151/59 97.6 F (36.4 C) -- 79 18 --  04/17/21 1717 (!) 163/73 97.6 F (36.4 C) Oral 86 20 98 %  04/17/21 1600 (!) 148/82 -- -- 73 16 97 %  04/17/21 1500 (!) 167/77 -- -- 72 16 97 %  04/17/21 1415 (!) 154/68 -- -- 72 16 99 %  04/17/21 1350 (!) 152/68 -- -- 72 16 98 %  04/17/21 1345 (!) 152/66 98 F (36.7 C) -- 75 14 98 %  04/17/21 1330 (!) 158/68 -- -- 72 (!) 32 95 %  04/17/21 1315 (!) 152/85 -- -- 73 16 94 %  04/17/21 1300 (!) 151/76 -- -- 72 10 100 %  04/17/21 1245 (!) 161/80 -- -- 73 20 100 %  04/17/21 1230 (!) 142/72 -- -- 66 17 96 %  04/17/21 1221 (!) 142/72 98 F (36.7 C) -- 77 18 98 %  04/17/21 0837 -- -- -- 70 11 100 %  04/17/21 0836 -- -- -- 70 12 100 %  04/17/21 0835 124/60 -- -- 71 13 100 %  04/17/21 0834 -- -- -- 71 14 100 %  04/17/21 0833 -- -- -- 72 16 100 %  04/17/21 0832 -- -- -- 71 14 100 %  04/17/21 0831 -- -- -- 72 (!) 22 100 %  04/17/21 0830 (!) 150/61 -- -- 71 20 99 %  04/17/21 0829 137/64 -- -- 70 -- 98 %     Recent laboratory studies:  Recent Labs    04/17/21 0755  WBC 5.8  HGB 12.8  HCT 38.3  PLT 238  NA 140  K 3.3*  CL 106  CO2 25  BUN 16  CREATININE 1.43*  GLUCOSE 136*  CALCIUM 9.1     Discharge Medications:   Allergies as of 04/18/2021   No Known Allergies     Medication List    STOP taking these medications   meloxicam 15 MG tablet Commonly known as: MOBIC     TAKE these medications   acetaminophen 650 MG CR tablet Commonly known as: TYLENOL Take 1,300 mg by mouth daily as needed for pain.   amLODipine 5 MG tablet Commonly known as: NORVASC Take 5 mg by mouth daily.   aspirin EC 81 MG tablet Take 1 tablet (81 mg total) by mouth 2 (two) times daily after a meal. Swallow  whole. What changed:   medication strength  how much to take  when to take this  reasons to take this  additional instructions   atorvastatin 40 MG tablet Commonly known as: LIPITOR Take 40 mg by mouth daily.   gabapentin 300 MG capsule Commonly known as: NEURONTIN Take 300 mg by mouth at bedtime.   glimepiride 1 MG tablet Commonly known as: AMARYL Take 1 mg by mouth daily.   HYDROcodone-acetaminophen 5-325 MG tablet Commonly known as: NORCO/VICODIN Take 1-2 tablets by mouth every 6 (six) hours as needed for moderate pain or severe pain.   lisinopril 20 MG tablet Commonly known as: ZESTRIL Take 20 mg by mouth daily.   metFORMIN 500 MG tablet Commonly known as: GLUCOPHAGE Take 250 mg by mouth 2 (two) times daily.   metoprolol tartrate 50 MG tablet Commonly known as: LOPRESSOR Take 50 mg by mouth every evening.   OVER THE COUNTER MEDICATION Apply 1 application topically daily as needed (pain). Pain relief cream with lidocaine   pantoprazole 40 MG tablet Commonly known as: PROTONIX Take 40 mg by mouth every evening.   Prevagen 10 MG Caps Generic drug: Apoaequorin Take 10 mg by mouth daily.   THERAWORX RELIEF EX Apply 1 application topically daily as needed (cramps).   tiZANidine 4 MG tablet Commonly known as: Zanaflex Take 1 tablet (4 mg total) by mouth every 6 (six) hours as needed for muscle spasms.   Vitamin D-3 125 MCG (5000 UT) Tabs Take 5,000 Units by mouth every Monday, Wednesday, and Friday.            Durable Medical Equipment  (From admission, onward)         Start     Ordered   04/17/21 1721  DME Walker rolling  Once       Question:  Patient needs a walker to treat with the following condition  Answer:  Primary osteoarthritis of left knee   04/17/21 1720   04/17/21 1721  DME 3 n 1  Once        04/17/21 1720   04/17/21 1721  DME Bedside commode  Once       Question:  Patient needs a bedside commode to treat with the following  condition  Answer:  Primary  osteoarthritis of left knee   04/17/21 1720          Diagnostic Studies: DG Chest 2 View  Result Date: 04/14/2021 CLINICAL DATA:  Preoperative assessment for knee surgery, previous tobacco abuse, diabetes EXAM: CHEST - 2 VIEW COMPARISON:  None. FINDINGS: The heart size and mediastinal contours are within normal limits. Both lungs are clear. The visualized skeletal structures are unremarkable. IMPRESSION: No active cardiopulmonary disease. Electronically Signed   By: Randa Ngo M.D.   On: 04/14/2021 01:27    Disposition: Discharge disposition: 01-Home or Self Care       Discharge Instructions    Call MD / Call 911   Complete by: As directed    If you experience chest pain or shortness of breath, CALL 911 and be transported to the hospital emergency room.  If you develope a fever above 101 F, pus (white drainage) or increased drainage or redness at the wound, or calf pain, call your surgeon's office.   Call MD / Call 911   Complete by: As directed    If you experience chest pain or shortness of breath, CALL 911 and be transported to the hospital emergency room.  If you develope a fever above 101 F, pus (white drainage) or increased drainage or redness at the wound, or calf pain, call your surgeon's office.   Constipation Prevention   Complete by: As directed    Drink plenty of fluids.  Prune juice may be helpful.  You may use a stool softener, such as Colace (over the counter) 100 mg twice a day.  Use MiraLax (over the counter) for constipation as needed.   Constipation Prevention   Complete by: As directed    Drink plenty of fluids.  Prune juice may be helpful.  You may use a stool softener, such as Colace (over the counter) 100 mg twice a day.  Use MiraLax (over the counter) for constipation as needed.   Diet - low sodium heart healthy   Complete by: As directed    Diet - low sodium heart healthy   Complete by: As directed    Discharge instructions    Complete by: As directed    INSTRUCTIONS AFTER JOINT REPLACEMENT   Remove items at home which could result in a fall. This includes throw rugs or furniture in walking pathways ICE to the affected joint every three hours while awake for 30 minutes at a time, for at least the first 3-5 days, and then as needed for pain and swelling.  Continue to use ice for pain and swelling. You may notice swelling that will progress down to the foot and ankle.  This is normal after surgery.  Elevate your leg when you are not up walking on it.   Continue to use the breathing machine you got in the hospital (incentive spirometer) which will help keep your temperature down.  It is common for your temperature to cycle up and down following surgery, especially at night when you are not up moving around and exerting yourself.  The breathing machine keeps your lungs expanded and your temperature down.   DIET:  As you were doing prior to hospitalization, we recommend a well-balanced diet.  DRESSING / WOUND CARE / SHOWERING  You may shower 3 days after surgery, but keep the wounds dry during showering.  You may use an occlusive plastic wrap (Press'n Seal for example), NO SOAKING/SUBMERGING IN THE BATHTUB.  If the bandage gets wet, change with a clean dry gauze.  If the incision gets wet, pat the wound dry with a clean towel.  ACTIVITY  Increase activity slowly as tolerated, but follow the weight bearing instructions below.   No driving for 6 weeks or until further direction given by your physician.  You cannot drive while taking narcotics.  No lifting or carrying greater than 10 lbs. until further directed by your surgeon. Avoid periods of inactivity such as sitting longer than an hour when not asleep. This helps prevent blood clots.  You may return to work once you are authorized by your doctor.     WEIGHT BEARING   Weight bearing as tolerated with assist device (walker, cane, etc) as directed, use it as long as  suggested by your surgeon or therapist, typically at least 4-6 weeks.   EXERCISES  Results after joint replacement surgery are often greatly improved when you follow the exercise, range of motion and muscle strengthening exercises prescribed by your doctor. Safety measures are also important to protect the joint from further injury. Any time any of these exercises cause you to have increased pain or swelling, decrease what you are doing until you are comfortable again and then slowly increase them. If you have problems or questions, call your caregiver or physical therapist for advice.   Rehabilitation is important following a joint replacement. After just a few days of immobilization, the muscles of the leg can become weakened and shrink (atrophy).  These exercises are designed to build up the tone and strength of the thigh and leg muscles and to improve motion. Often times heat used for twenty to thirty minutes before working out will loosen up your tissues and help with improving the range of motion but do not use heat for the first two weeks following surgery (sometimes heat can increase post-operative swelling).   These exercises can be done on a training (exercise) mat, on the floor, on a table or on a bed. Use whatever works the best and is most comfortable for you.    Use music or television while you are exercising so that the exercises are a pleasant break in your day. This will make your life better with the exercises acting as a break in your routine that you can look forward to.   Perform all exercises about fifteen times, three times per day or as directed.  You should exercise both the operative leg and the other leg as well.  Exercises include:   Quad Sets - Tighten up the muscle on the front of the thigh (Quad) and hold for 5-10 seconds.   Straight Leg Raises - With your knee straight (if you were given a brace, keep it on), lift the leg to 60 degrees, hold for 3 seconds, and slowly  lower the leg.  Perform this exercise against resistance later as your leg gets stronger.  Leg Slides: Lying on your back, slowly slide your foot toward your buttocks, bending your knee up off the floor (only go as far as is comfortable). Then slowly slide your foot back down until your leg is flat on the floor again.  Angel Wings: Lying on your back spread your legs to the side as far apart as you can without causing discomfort.  Hamstring Strength:  Lying on your back, push your heel against the floor with your leg straight by tightening up the muscles of your buttocks.  Repeat, but this time bend your knee to a comfortable angle, and push your heel against the floor.  You  may put a pillow under the heel to make it more comfortable if necessary.   A rehabilitation program following joint replacement surgery can speed recovery and prevent re-injury in the future due to weakened muscles. Contact your doctor or a physical therapist for more information on knee rehabilitation.    CONSTIPATION  Constipation is defined medically as fewer than three stools per week and severe constipation as less than one stool per week.  Even if you have a regular bowel pattern at home, your normal regimen is likely to be disrupted due to multiple reasons following surgery.  Combination of anesthesia, postoperative narcotics, change in appetite and fluid intake all can affect your bowels.   YOU MUST use at least one of the following options; they are listed in order of increasing strength to get the job done.  They are all available over the counter, and you may need to use some, POSSIBLY even all of these options:    Drink plenty of fluids (prune juice may be helpful) and high fiber foods Colace 100 mg by mouth twice a day  Senokot for constipation as directed and as needed Dulcolax (bisacodyl), take with full glass of water  Miralax (polyethylene glycol) once or twice a day as needed.  If you have tried all these  things and are unable to have a bowel movement in the first 3-4 days after surgery call either your surgeon or your primary doctor.    If you experience loose stools or diarrhea, hold the medications until you stool forms back up.  If your symptoms do not get better within 1 week or if they get worse, check with your doctor.  If you experience "the worst abdominal pain ever" or develop nausea or vomiting, please contact the office immediately for further recommendations for treatment.   ITCHING:  If you experience itching with your medications, try taking only a single pain pill, or even half a pain pill at a time.  You can also use Benadryl over the counter for itching or also to help with sleep.   TED HOSE STOCKINGS:  Use stockings on both legs until for at least 2 weeks or as directed by physician office. They may be removed at night for sleeping.  MEDICATIONS:  See your medication summary on the "After Visit Summary" that nursing will review with you.  You may have some home medications which will be placed on hold until you complete the course of blood thinner medication.  It is important for you to complete the blood thinner medication as prescribed.  PRECAUTIONS:  If you experience chest pain or shortness of breath - call 911 immediately for transfer to the hospital emergency department.   If you develop a fever greater that 101 F, purulent drainage from wound, increased redness or drainage from wound, foul odor from the wound/dressing, or calf pain - CONTACT YOUR SURGEON.                                                   FOLLOW-UP APPOINTMENTS:  If you do not already have a post-op appointment, please call the office for an appointment to be seen by your surgeon.  Guidelines for how soon to be seen are listed in your "After Visit Summary", but are typically between 1-4 weeks after surgery.  OTHER INSTRUCTIONS:   Knee Replacement:  Do not place pillow under knee, focus on keeping the knee  straight while resting. CPM instructions: 0-90 degrees, 2 hours in the morning, 2 hours in the afternoon, and 2 hours in the evening. Place foam block, curve side up under heel at all times except when in CPM or when walking.  DO NOT modify, tear, cut, or change the foam block in any way.  POST-OPERATIVE OPIOID TAPER INSTRUCTIONS: It is important to wean off of your opioid medication as soon as possible. If you do not need pain medication after your surgery it is ok to stop day one. Opioids include: Codeine, Hydrocodone(Norco, Vicodin), Oxycodone(Percocet, oxycontin) and hydromorphone amongst others.  Long term and even short term use of opiods can cause: Increased pain response Dependence Constipation Depression Respiratory depression And more.  Withdrawal symptoms can include Flu like symptoms Nausea, vomiting And more Techniques to manage these symptoms Hydrate well Eat regular healthy meals Stay active Use relaxation techniques(deep breathing, meditating, yoga) Do Not substitute Alcohol to help with tapering If you have been on opioids for less than two weeks and do not have pain than it is ok to stop all together.  Plan to wean off of opioids This plan should start within one week post op of your joint replacement. Maintain the same interval or time between taking each dose and first decrease the dose.  Cut the total daily intake of opioids by one tablet each day Next start to increase the time between doses. The last dose that should be eliminated is the evening dose.     MAKE SURE YOU:  Understand these instructions.  Get help right away if you are not doing well or get worse.    Thank you for letting us be a part of your medical care team.  It is a privilege we respect greatly.  We hope these instructions will help you stay on track for a fast and full recovery!   Discharge instructions   Complete by: As directed    INSTRUCTIONS AFTER JOINT REPLACEMENT   Remove  items at home which could result in a fall. This includes throw rugs or furniture in walking pathways ICE to the affected joint every three hours while awake for 30 minutes at a time, for at least the first 3-5 days, and then as needed for pain and swelling.  Continue to use ice for pain and swelling. You may notice swelling that will progress down to the foot and ankle.  This is normal after surgery.  Elevate your leg when you are not up walking on it.   Continue to use the breathing machine you got in the hospital (incentive spirometer) which will help keep your temperature down.  It is common for your temperature to cycle up and down following surgery, especially at night when you are not up moving around and exerting yourself.  The breathing machine keeps your lungs expanded and your temperature down.   DIET:  As you were doing prior to hospitalization, we recommend a well-balanced diet.  DRESSING / WOUND CARE / SHOWERING  You may shower 3 days after surgery, but keep the wounds dry during showering.  You may use an occlusive plastic wrap (Press'n Seal for example), NO SOAKING/SUBMERGING IN THE BATHTUB.  If the bandage gets wet, change with a clean dry gauze.  If the incision gets wet, pat the wound dry with a clean towel.  ACTIVITY  Increase activity slowly as tolerated, but follow the weight bearing instructions below.  No driving for 6 weeks or until further direction given by your physician.  You cannot drive while taking narcotics.  No lifting or carrying greater than 10 lbs. until further directed by your surgeon. Avoid periods of inactivity such as sitting longer than an hour when not asleep. This helps prevent blood clots.  You may return to work once you are authorized by your doctor.     WEIGHT BEARING   Weight bearing as tolerated with assist device (walker, cane, etc) as directed, use it as long as suggested by your surgeon or therapist, typically at least 4-6  weeks.   EXERCISES  Results after joint replacement surgery are often greatly improved when you follow the exercise, range of motion and muscle strengthening exercises prescribed by your doctor. Safety measures are also important to protect the joint from further injury. Any time any of these exercises cause you to have increased pain or swelling, decrease what you are doing until you are comfortable again and then slowly increase them. If you have problems or questions, call your caregiver or physical therapist for advice.   Rehabilitation is important following a joint replacement. After just a few days of immobilization, the muscles of the leg can become weakened and shrink (atrophy).  These exercises are designed to build up the tone and strength of the thigh and leg muscles and to improve motion. Often times heat used for twenty to thirty minutes before working out will loosen up your tissues and help with improving the range of motion but do not use heat for the first two weeks following surgery (sometimes heat can increase post-operative swelling).   These exercises can be done on a training (exercise) mat, on the floor, on a table or on a bed. Use whatever works the best and is most comfortable for you.    Use music or television while you are exercising so that the exercises are a pleasant break in your day. This will make your life better with the exercises acting as a break in your routine that you can look forward to.   Perform all exercises about fifteen times, three times per day or as directed.  You should exercise both the operative leg and the other leg as well.  Exercises include:   Quad Sets - Tighten up the muscle on the front of the thigh (Quad) and hold for 5-10 seconds.   Straight Leg Raises - With your knee straight (if you were given a brace, keep it on), lift the leg to 60 degrees, hold for 3 seconds, and slowly lower the leg.  Perform this exercise against resistance later as  your leg gets stronger.  Leg Slides: Lying on your back, slowly slide your foot toward your buttocks, bending your knee up off the floor (only go as far as is comfortable). Then slowly slide your foot back down until your leg is flat on the floor again.  Angel Wings: Lying on your back spread your legs to the side as far apart as you can without causing discomfort.  Hamstring Strength:  Lying on your back, push your heel against the floor with your leg straight by tightening up the muscles of your buttocks.  Repeat, but this time bend your knee to a comfortable angle, and push your heel against the floor.  You may put a pillow under the heel to make it more comfortable if necessary.   A rehabilitation program following joint replacement surgery can speed recovery and prevent re-injury in  the future due to weakened muscles. Contact your doctor or a physical therapist for more information on knee rehabilitation.    CONSTIPATION  Constipation is defined medically as fewer than three stools per week and severe constipation as less than one stool per week.  Even if you have a regular bowel pattern at home, your normal regimen is likely to be disrupted due to multiple reasons following surgery.  Combination of anesthesia, postoperative narcotics, change in appetite and fluid intake all can affect your bowels.   YOU MUST use at least one of the following options; they are listed in order of increasing strength to get the job done.  They are all available over the counter, and you may need to use some, POSSIBLY even all of these options:    Drink plenty of fluids (prune juice may be helpful) and high fiber foods Colace 100 mg by mouth twice a day  Senokot for constipation as directed and as needed Dulcolax (bisacodyl), take with full glass of water  Miralax (polyethylene glycol) once or twice a day as needed.  If you have tried all these things and are unable to have a bowel movement in the first 3-4 days  after surgery call either your surgeon or your primary doctor.    If you experience loose stools or diarrhea, hold the medications until you stool forms back up.  If your symptoms do not get better within 1 week or if they get worse, check with your doctor.  If you experience "the worst abdominal pain ever" or develop nausea or vomiting, please contact the office immediately for further recommendations for treatment.   ITCHING:  If you experience itching with your medications, try taking only a single pain pill, or even half a pain pill at a time.  You can also use Benadryl over the counter for itching or also to help with sleep.   TED HOSE STOCKINGS:  Use stockings on both legs until for at least 2 weeks or as directed by physician office. They may be removed at night for sleeping.  MEDICATIONS:  See your medication summary on the "After Visit Summary" that nursing will review with you.  You may have some home medications which will be placed on hold until you complete the course of blood thinner medication.  It is important for you to complete the blood thinner medication as prescribed.  PRECAUTIONS:  If you experience chest pain or shortness of breath - call 911 immediately for transfer to the hospital emergency department.   If you develop a fever greater that 101 F, purulent drainage from wound, increased redness or drainage from wound, foul odor from the wound/dressing, or calf pain - CONTACT YOUR SURGEON.                                                   FOLLOW-UP APPOINTMENTS:  If you do not already have a post-op appointment, please call the office for an appointment to be seen by your surgeon.  Guidelines for how soon to be seen are listed in your "After Visit Summary", but are typically between 1-4 weeks after surgery.  OTHER INSTRUCTIONS:   Knee Replacement:  Do not place pillow under knee, focus on keeping the knee straight while resting. CPM instructions: 0-90 degrees, 2 hours in the  morning, 2 hours in the afternoon,  and 2 hours in the evening. Place foam block, curve side up under heel at all times except when in CPM or when walking.  DO NOT modify, tear, cut, or change the foam block in any way.  POST-OPERATIVE OPIOID TAPER INSTRUCTIONS: It is important to wean off of your opioid medication as soon as possible. If you do not need pain medication after your surgery it is ok to stop day one. Opioids include: Codeine, Hydrocodone(Norco, Vicodin), Oxycodone(Percocet, oxycontin) and hydromorphone amongst others.  Long term and even short term use of opiods can cause: Increased pain response Dependence Constipation Depression Respiratory depression And more.  Withdrawal symptoms can include Flu like symptoms Nausea, vomiting And more Techniques to manage these symptoms Hydrate well Eat regular healthy meals Stay active Use relaxation techniques(deep breathing, meditating, yoga) Do Not substitute Alcohol to help with tapering If you have been on opioids for less than two weeks and do not have pain than it is ok to stop all together.  Plan to wean off of opioids This plan should start within one week post op of your joint replacement. Maintain the same interval or time between taking each dose and first decrease the dose.  Cut the total daily intake of opioids by one tablet each day Next start to increase the time between doses. The last dose that should be eliminated is the evening dose.     MAKE SURE YOU:  Understand these instructions.  Get help right away if you are not doing well or get worse.    Thank you for letting us be a part of your medical care team.  It is a privilege we respect greatly.  We hope these instructions will help you stay on track for a fast and full recovery!   Increase activity slowly as tolerated   Complete by: As directed    Increase activity slowly as tolerated   Complete by: As directed    Post-operative opioid taper  instructions:   Complete by: As directed    POST-OPERATIVE OPIOID TAPER INSTRUCTIONS: It is important to wean off of your opioid medication as soon as possible. If you do not need pain medication after your surgery it is ok to stop day one. Opioids include: Codeine, Hydrocodone(Norco, Vicodin), Oxycodone(Percocet, oxycontin) and hydromorphone amongst others.  Long term and even short term use of opiods can cause: Increased pain response Dependence Constipation Depression Respiratory depression And more.  Withdrawal symptoms can include Flu like symptoms Nausea, vomiting And more Techniques to manage these symptoms Hydrate well Eat regular healthy meals Stay active Use relaxation techniques(deep breathing, meditating, yoga) Do Not substitute Alcohol to help with tapering If you have been on opioids for less than two weeks and do not have pain than it is ok to stop all together.  Plan to wean off of opioids This plan should start within one week post op of your joint replacement. Maintain the same interval or time between taking each dose and first decrease the dose.  Cut the total daily intake of opioids by one tablet each day Next start to increase the time between doses. The last dose that should be eliminated is the evening dose.      Post-operative opioid taper instructions:   Complete by: As directed    POST-OPERATIVE OPIOID TAPER INSTRUCTIONS: It is important to wean off of your opioid medication as soon as possible. If you do not need pain medication after your surgery it is ok to stop day one. Opioids include:  Codeine, Hydrocodone(Norco, Vicodin), Oxycodone(Percocet, oxycontin) and hydromorphone amongst others.  Long term and even short term use of opiods can cause: Increased pain response Dependence Constipation Depression Respiratory depression And more.  Withdrawal symptoms can include Flu like symptoms Nausea, vomiting And more Techniques to manage these  symptoms Hydrate well Eat regular healthy meals Stay active Use relaxation techniques(deep breathing, meditating, yoga) Do Not substitute Alcohol to help with tapering If you have been on opioids for less than two weeks and do not have pain than it is ok to stop all together.  Plan to wean off of opioids This plan should start within one week post op of your joint replacement. Maintain the same interval or time between taking each dose and first decrease the dose.  Cut the total daily intake of opioids by one tablet each day Next start to increase the time between doses. The last dose that should be eliminated is the evening dose.          Follow-up Information    Melrose Nakayama, MD. Schedule an appointment as soon as possible for a visit in 2 weeks.   Specialty: Orthopedic Surgery Contact information: [place] [address] [phone]                Signed: Larwance Sachs Quavon Keisling 04/18/2021, 8:10 AM

## 2021-04-18 NOTE — Progress Notes (Signed)
   04/18/21 1400  PT Visit Information  Last PT Received On 04/18/21  Pt progressing. Will need assist from husband for stairs and amb for safety until HHPT sees pt   Assistance Needed +1  History of Present Illness s/p L TKA PMH: HTN, DM, neuromuscular d/o, L2-5 decompression and fusion  Subjective Data  Patient Stated Goal walk easier  Precautions  Precautions Fall;Knee  Restrictions  Weight Bearing Restrictions No  Other Position/Activity Restrictions WBAT  Pain Assessment  Pain Assessment 0-10  Pain Score 4  Pain Location L knee  Pain Descriptors / Indicators Aching;Sore  Pain Intervention(s) Limited activity within patient's tolerance;Monitored during session;Premedicated before session;Repositioned;Ice applied  Cognition  Arousal/Alertness Awake/alert  Behavior During Therapy WFL for tasks assessed/performed  Overall Cognitive Status Within Functional Limits for tasks assessed  Transfers  Overall transfer level Needs assistance  Equipment used Rolling walker (2 wheeled)  Transfers Sit to/from Stand  Sit to Stand Min assist;Min guard  General transfer comment cues for safety and hand placement  Ambulation/Gait  Ambulation/Gait assistance Min guard;Min assist  Gait Distance (Feet) 75 Feet  Assistive device Rolling walker (2 wheeled)  Gait Pattern/deviations Step-to pattern;Decreased stance time - left  General Gait Details multi-modal cues for RW position, step length and sequence. assist to balance and maneuver RW  Stairs Yes  Stairs assistance Min assist  Stair Management One rail Right;One rail Left;Step to pattern;Sideways  Number of Stairs 3  General stair comments multi-modal cues for sequence and safe technique, assist with descent  Balance  Standing balance-Leahy Scale Fair  PT - End of Session  Equipment Utilized During Treatment Gait belt  Activity Tolerance Patient tolerated treatment well  Patient left with call bell/phone within reach;in bed;with chair  alarm set  Nurse Communication Mobility status   PT - Assessment/Plan  PT Plan Current plan remains appropriate  PT Visit Diagnosis Other abnormalities of gait and mobility (R26.89);Difficulty in walking, not elsewhere classified (R26.2)  PT Frequency (ACUTE ONLY) 7X/week  PT equipment Rolling walker with 5" wheels  AM-PAC PT "6 Clicks" Mobility Outcome Measure (Version 2)  Help needed turning from your back to your side while in a flat bed without using bedrails? 3  Help needed moving from lying on your back to sitting on the side of a flat bed without using bedrails? 3  Help needed moving to and from a bed to a chair (including a wheelchair)? 3  Help needed standing up from a chair using your arms (e.g., wheelchair or bedside chair)? 3  Help needed to walk in hospital room? 3  Help needed climbing 3-5 steps with a railing?  3  6 Click Score 18  Consider Recommendation of Discharge To: Home with [facility]  PT Goal Progression  Progress towards PT goals Progressing toward goals  Acute Rehab PT Goals  PT Goal Formulation With patient  Time For Goal Achievement 04/24/21  Potential to Achieve Goals Good  PT Time Calculation  PT Start Time (ACUTE ONLY) 1350  PT Stop Time (ACUTE ONLY) 1403  PT Time Calculation (min) (ACUTE ONLY) 13 min  PT General Charges  $$ ACUTE PT VISIT 1 Visit  PT Treatments  $Gait Training 8-22 mins

## 2021-04-18 NOTE — Progress Notes (Signed)
Subjective: 1 Day Post-Op Procedure(s) (LRB): LEFT TOTAL KNEE ARTHROPLASTY (Left)   Patient is doing well. She is hoping to go home today.  Activity level:  wbat Diet tolerance:  ok Voiding:  ok Patient reports pain as mild.    Objective: Vital signs in last 24 hours: Temp:  [97.5 F (36.4 C)-98 F (36.7 C)] 98 F (36.7 C) (06/01 0459) Pulse Rate:  [66-86] 76 (06/01 0459) Resp:  [10-32] 16 (06/01 0459) BP: (124-167)/(59-96) 147/67 (06/01 0459) SpO2:  [94 %-100 %] 100 % (06/01 0459)  Labs: Recent Labs    04/17/21 0755  HGB 12.8   Recent Labs    04/17/21 0755  WBC 5.8  RBC 4.56  HCT 38.3  PLT 238   Recent Labs    04/17/21 0755  NA 140  K 3.3*  CL 106  CO2 25  BUN 16  CREATININE 1.43*  GLUCOSE 136*  CALCIUM 9.1   No results for input(s): LABPT, INR in the last 72 hours.  Physical Exam:  Neurologically intact ABD soft Neurovascular intact Sensation intact distally Intact pulses distally Dorsiflexion/Plantar flexion intact Incision: dressing C/D/I and no drainage No cellulitis present Compartment soft  Assessment/Plan:  1 Day Post-Op Procedure(s) (LRB): LEFT TOTAL KNEE ARTHROPLASTY (Left) Advance diet Up with therapy D/C IV fluids Discharge home with home health today if doing well and cleared by PT. Follow up in office 2 weeks post op. Continue on 71mng ASA BID for dvt prevention.    Felicia Shelton 04/18/2021, 8:08 AM

## 2021-04-20 ENCOUNTER — Encounter (HOSPITAL_COMMUNITY): Payer: Self-pay

## 2021-04-20 ENCOUNTER — Emergency Department (HOSPITAL_COMMUNITY)
Admission: EM | Admit: 2021-04-20 | Discharge: 2021-04-20 | Disposition: A | Discharge status: Home / Self Care | Payer: Medicare Other | Attending: Emergency Medicine | Admitting: Emergency Medicine

## 2021-04-20 ENCOUNTER — Emergency Department (HOSPITAL_COMMUNITY): Payer: Medicare Other

## 2021-04-20 ENCOUNTER — Other Ambulatory Visit: Payer: Self-pay

## 2021-04-20 DIAGNOSIS — W010XXA Fall on same level from slipping, tripping and stumbling without subsequent striking against object, initial encounter: Secondary | ICD-10-CM | POA: Insufficient documentation

## 2021-04-20 DIAGNOSIS — Y92002 Bathroom of unspecified non-institutional (private) residence single-family (private) house as the place of occurrence of the external cause: Secondary | ICD-10-CM | POA: Insufficient documentation

## 2021-04-20 DIAGNOSIS — Z87891 Personal history of nicotine dependence: Secondary | ICD-10-CM | POA: Insufficient documentation

## 2021-04-20 DIAGNOSIS — T8130XA Disruption of wound, unspecified, initial encounter: Secondary | ICD-10-CM

## 2021-04-20 DIAGNOSIS — Z96652 Presence of left artificial knee joint: Secondary | ICD-10-CM | POA: Insufficient documentation

## 2021-04-20 DIAGNOSIS — S81012A Laceration without foreign body, left knee, initial encounter: Secondary | ICD-10-CM | POA: Diagnosis not present

## 2021-04-20 DIAGNOSIS — E119 Type 2 diabetes mellitus without complications: Secondary | ICD-10-CM | POA: Diagnosis not present

## 2021-04-20 DIAGNOSIS — I10 Essential (primary) hypertension: Secondary | ICD-10-CM | POA: Insufficient documentation

## 2021-04-20 DIAGNOSIS — S8992XA Unspecified injury of left lower leg, initial encounter: Secondary | ICD-10-CM | POA: Diagnosis present

## 2021-04-20 LAB — CBG MONITORING, ED: Glucose-Capillary: 149 mg/dL — ABNORMAL HIGH (ref 70–99)

## 2021-04-20 MED ORDER — LIDOCAINE HCL (PF) 1 % IJ SOLN
5.0000 mL | Freq: Once | INTRAMUSCULAR | Status: AC
  Administered 2021-04-20: 5 mL
  Filled 2021-04-20: qty 30

## 2021-04-20 MED ORDER — CEPHALEXIN 500 MG PO CAPS: 500.0000 mg | ORAL_CAPSULE | Freq: Three times a day (TID) | ORAL | 0 refills | Status: AC

## 2021-04-20 NOTE — ED Triage Notes (Addendum)
Pt arrives from home via EMS. Pt had mechanical fall getting out of bed. Pt hit her left knee opening some sutures. Pt had total knee replacement 04/17/21. Bandage is saturated. Pt also c/o left hip pain.

## 2021-04-20 NOTE — ED Provider Notes (Signed)
[facility] Emergency Department Provider Note MRN:  [id number]  Arrival date & time: 04/20/21     Chief Complaint   Wound Check and Fall   History of Present Illness   Felicia Shelton is a 80 y.o. year-old female with a history of diabetes, hypertension presenting to the ED with chief complaint of fall.  Patient underwent total knee replacement on 5-31.  Has been recovering well.  This evening she slipped on something in the bathroom and fell.  Denies head trauma, no loss of consciousness, no neck or back pain, no chest pain or shortness of breath, no abdominal pain.  Fell onto her right hip, which does not hurt.  She thinks that the fall forced her to bend her newly replaced left knee, and now the wound is bleeding.  Here for evaluation.  Denies any significant pain.  Review of Systems  A complete 10 system review of systems was obtained and all systems are negative except as noted in the HPI and PMH.   Patient's Health History    Past Medical History:  Diagnosis Date  . Allergy   . Arthritis    back  . Diabetes mellitus without complication ([place])    type 2   . GERD (gastroesophageal reflux disease)   . Hx of adenomatous colonic polyp 05/26/2008  . Hyperlipidemia   . Hypertension   . Neuromuscular disorder [facility])     Past Surgical History:  Procedure Laterality Date  . ABDOMINAL HYSTERECTOMY    . APPENDECTOMY    . BREAST EXCISIONAL BIOPSY Left   . BREAST SURGERY     left breast  . CATARACT EXTRACTION Right 02/2020  . COLONOSCOPY  10/11/2015   polyps 2009  . JOINT REPLACEMENT    . TONSILLECTOMY     removed age 8-5  . TOTAL KNEE ARTHROPLASTY Left 04/17/2021   Procedure: LEFT TOTAL KNEE ARTHROPLASTY;  Surgeon: Melrose Nakayama, MD;  Location: [facility];  Service: Orthopedics;  Laterality: Left;  . TRANSFORAMINAL LUMBAR INTERBODY FUSION (TLIF) WITH PEDICLE SCREW FIXATION 2 LEVEL Right 12/31/2018   Procedure: RIGHT - SIDED LUMBAR 2-3,LUMBAR 3-4, LUMBAR 4-5  TRANSFORAMINAL LUMBAR INTERBODY FUSION WITH INSTRUMENTATION AND ALLOGRAFT;  Surgeon: Phylliss Bob, MD;  Location: [facility];  Service: Orthopedics;  Laterality: Right;    Family History  Adopted: Yes  Problem Relation Age of Onset  . Colon cancer Maternal Aunt     Social History   Socioeconomic History  . Marital status: Married    Spouse name: Not on file  . Number of children: Not on file  . Years of education: Not on file  . Highest education level: Not on file  Occupational History  . Not on file  Tobacco Use  . Smoking status: Former Smoker    Packs/day: 0.00    Years: 48.00    Pack years: 0.00    Types: Cigarettes    Quit date: 08/18/2018    Years since quitting: 2.6  . Smokeless tobacco: Never Used  Vaping Use  . Vaping Use: Never used  Substance and Sexual Activity  . Alcohol use: Never    Alcohol/week: 0.0 standard drinks  . Drug use: No  . Sexual activity: Not on file  Other Topics Concern  . Not on file  Social History Narrative  . Not on file   Social Determinants of Health   Financial Resource Strain: Not on file  Food Insecurity: Not on file  Transportation Needs: Not on file  Physical Activity:  Not on file  Stress: Not on file  Social Connections: Not on file  Intimate Partner Violence: Not on file     Physical Exam   Vitals:   04/20/21 0400 04/20/21 0405  BP: (!) 184/69 (!) 184/69  Pulse: 93 87  Resp:  17  Temp:    SpO2: (!) 86% 100%    CONSTITUTIONAL: Well-appearing, NAD NEURO:  Alert and oriented x 3, no focal deficits EYES:  eyes equal and reactive ENT/NECK:  no LAD, no JVD CARDIO: Regular rate, well-perfused, normal S1 and S2 PULM:  CTAB no wheezing or rhonchi GI/GU:  normal bowel sounds, non-distended, non-tender MSK/SPINE:  No gross deformities, no edema SKIN:  no rash, atraumatic PSYCH:  Appropriate speech and behavior  Media Information         Document Information  Photos    04/20/2021 03:01  Attached To:   Hospital Encounter on 04/20/21   Source Information  Dovie Kapusta, Barth Kirks, MD  [facility]    *Additional and/or pertinent findings included in MDM below  Diagnostic and Interventional Summary    EKG Interpretation  Date/Time:    Ventricular Rate:    PR Interval:    QRS Duration:   QT Interval:    QTC Calculation:   R Axis:     Text Interpretation:        Labs Reviewed  CBG MONITORING, ED - Abnormal; Notable for the following components:      Result Value   Glucose-Capillary 149 (*)    All other components within normal limits    DG Knee Complete 4 Views Left  Final Result      Medications  lidocaine (PF) (XYLOCAINE) 1 % injection 5 mL (5 mLs Infiltration Given 04/20/21 0435)     Procedures  /  Critical Care .Marland KitchenLaceration Repair  Date/Time: 04/20/2021 4:50 AM Performed by: Maudie Flakes, MD Authorized by: Maudie Flakes, MD   Consent:    Consent obtained:  Verbal   Consent given by:  Patient   Risks, benefits, and alternatives were discussed: yes     Risks discussed:  Infection, need for additional repair, nerve damage, pain, poor cosmetic result, poor wound healing, retained foreign body, tendon damage and vascular damage   Alternatives discussed:  No treatment Universal protocol:    Procedure explained and questions answered to patient or proxy's satisfaction: yes     Imaging studies available: yes     Site/side marked: yes     Immediately prior to procedure, a time out was called: yes     Patient identity confirmed:  Verbally with patient Anesthesia:    Anesthesia method:  Local infiltration   Local anesthetic:  Lidocaine 1% w/o epi Laceration details:    Location:  Leg   Leg location:  L knee   Length (cm):  8.5   Depth (mm):  3 Pre-procedure details:    Preparation:  Patient was prepped and draped in usual sterile fashion Exploration:    Limited defect created (wound extended): no     Hemostasis achieved with:  Direct pressure   Imaging  obtained: x-ray     Imaging outcome: foreign body not noted     Wound exploration: wound explored through full range of motion and entire depth of wound visualized     Contaminated: no   Treatment:    Area cleansed with:  Saline   Amount of cleaning:  Standard   Debridement:  Minimal   Undermining:  None   Scar revision:  no   Skin repair:    Repair method:  Staples   Number of staples:  8 Approximation:    Approximation:  Close Repair type:    Repair type:  Simple Post-procedure details:    Dressing:  Non-adherent dressing   Procedure completion:  Tolerated well, no immediate complications    ED Course and Medical Decision Making  I have reviewed the triage vital signs, the nursing notes, and pertinent available records from the EMR.  Listed above are laboratory and imaging tests that I personally ordered, reviewed, and interpreted and then considered in my medical decision making (see below for details).  Mechanical fall, bleeding from recent surgical site, seems to be a very small area of dehiscence.  X-rays pending, will reach out to orthopedics.  Surgery done by Dr. Rhona Raider.     X-ray is normal.  Case discussed with Dr. Mayer Camel, who is on-call for [facility] orth[facility].  Patient is appropriate for staple repair and close follow-up.  Will cover with Keflex to prevent infection.  Barth Kirks. Sedonia Small, [place] [email or web address]  Final Clinical Impressions(s) / ED Diagnoses     ICD-10-CM   1. Wound dehiscence  T81.30XA     ED Discharge Orders         Ordered    cephALEXin (KEFLEX) 500 MG capsule  3 times daily        04/20/21 0445           Discharge Instructions Discussed with and Provided to Patient:     Discharge Instructions     You were evaluated in the Emergency Department and after careful evaluation, we did not find any emergent condition requiring admission or further testing in the hospital.  Your  exam/testing today was overall reassuring.  X-ray did not show any broken bones or complications.  We put a few staples in your need to reclose the wound.  Please follow-up with Dr. Rhona Raider within the next day or 2 for reassessment.  Please take the antibiotics as directed to prevent infection.  Please return to the Emergency Department if you experience any worsening of your condition.  Thank you for allowing [place] to be a part of your care.        Maudie Flakes, MD 04/20/21 [phone]

## 2021-04-20 NOTE — ED Notes (Signed)
Pt verbalized understanding of d/c and follow up care. Ambulatory with assistance

## 2021-04-20 NOTE — Discharge Instructions (Addendum)
You were evaluated in the Emergency Department and after careful evaluation, we did not find any emergent condition requiring admission or further testing in the hospital.  Your exam/testing today was overall reassuring.  X-ray did not show any broken bones or complications.  We put a few staples in your need to reclose the wound.  Please follow-up with Dr. Rhona Raider within the next day or 2 for reassessment.  Please take the antibiotics as directed to prevent infection.  Please return to the Emergency Department if you experience any worsening of your condition.  Thank you for allowing [place] to be a part of your care.

## 2021-04-20 NOTE — ED Notes (Signed)
Pt returned to room  

## 2021-04-20 NOTE — ED Notes (Signed)
Pt transported to xray 

## 2021-04-21 LAB — TYPE AND SCREEN
ABO/RH(D): O POS
Antibody Screen: NEGATIVE
Unit division: 0
Unit division: 0

## 2021-04-21 LAB — BPAM RBC
Blood Product Expiration Date: 202206282359
Blood Product Expiration Date: 202206282359
Unit Type and Rh: 5100
Unit Type and Rh: 5100

## 2021-05-26 DIAGNOSIS — E119 Type 2 diabetes mellitus without complications: Secondary | ICD-10-CM | POA: Insufficient documentation

## 2021-05-26 DIAGNOSIS — E1169 Type 2 diabetes mellitus with other specified complication: Secondary | ICD-10-CM | POA: Insufficient documentation

## 2021-06-05 ENCOUNTER — Other Ambulatory Visit: Payer: Self-pay

## 2021-06-05 ENCOUNTER — Ambulatory Visit: Payer: Medicare Other | Admitting: Podiatry

## 2021-06-05 ENCOUNTER — Encounter: Payer: Self-pay | Admitting: Podiatry

## 2021-06-05 DIAGNOSIS — M79674 Pain in right toe(s): Secondary | ICD-10-CM | POA: Diagnosis not present

## 2021-06-05 DIAGNOSIS — M79675 Pain in left toe(s): Secondary | ICD-10-CM

## 2021-06-05 DIAGNOSIS — B351 Tinea unguium: Secondary | ICD-10-CM | POA: Diagnosis not present

## 2021-06-05 DIAGNOSIS — E1142 Type 2 diabetes mellitus with diabetic polyneuropathy: Secondary | ICD-10-CM | POA: Diagnosis not present

## 2021-06-08 NOTE — Progress Notes (Signed)
Subjective: Felicia Shelton is a pleasant 80 y.o. female patient seen today for at risk foot care. She has h/o diabetic neuropathy. She is seen for painful thick toenails that are difficult to trim. Pain interferes with ambulation. Aggravating factors include wearing enclosed shoe gear. Pain is relieved with periodic professional debridement.  PCP is Dixie Dials, MD. Last visit was: May, 2022.  No Known Allergies  Objective: Physical Exam  General: Felicia Shelton is a pleasant 80 y.o. African American female, WD, WN in NAD. AAO x 3.   Vascular:  Capillary fill time to digits <3 seconds b/l lower extremities. Faintly palpable DP pulse(s) b/l lower extremities. Faintly palpable PT pulse(s) b/l lower extremities. Pedal hair present. Lower extremity skin temperature gradient within normal limits. No pain with calf compression b/l. No edema noted b/l lower extremities.  Dermatological:  Pedal skin with normal turgor, texture and tone b/l lower extremities. Toenails L hallux, L 2nd toe, L 3rd toe, L 4th toe, R hallux, R 2nd toe, R 3rd toe, and R 4th toe elongated, discolored, dystrophic, thickened, and crumbly with subungual debris and tenderness to dorsal palpation. Anonychia noted L 5th toe and R 5th toe. Nailbed(s) epithelialized.   Musculoskeletal:  Normal muscle strength 5/5 to all lower extremity muscle groups bilaterally. No pain crepitus or joint limitation noted with ROM b/l. Hallux valgus with bunion deformity noted b/l lower extremities. Hammertoe(s) noted to the 2-5 bilaterally.  Neurological:  Pt has subjective symptoms of neuropathy. Protective sensation intact 5/5 intact bilaterally with 10g monofilament b/l. Vibratory sensation diminished b/l. Proprioception intact bilaterally.  Assessment and Plan:  1. Pain due to onychomycosis of toenails of both feet   2. Diabetic polyneuropathy associated with type 2 diabetes mellitus ([place])     -No new findings. No new orders. -Continue diabetic  foot care principles. -Patient to continue soft, supportive shoe gear daily. -Toenails L hallux, L 2nd toe, L 3rd toe, L 4th toe, R hallux, R 2nd toe, R 3rd toe, and R 4th toe debrided in length and girth without iatrogenic bleeding with sterile nail nipper and dremel.  -Patient to report any pedal injuries to medical professional immediately. -Patient/POA to call should there be question/concern in the interim.  Return in about 3 months (around 09/05/2021).  Marzetta Board, DPM

## 2021-08-20 ENCOUNTER — Other Ambulatory Visit: Payer: Self-pay | Admitting: Cardiovascular Disease

## 2021-08-20 DIAGNOSIS — Z1231 Encounter for screening mammogram for malignant neoplasm of breast: Secondary | ICD-10-CM

## 2021-09-05 ENCOUNTER — Ambulatory Visit: Payer: Medicare Other | Admitting: Podiatry

## 2021-09-05 ENCOUNTER — Encounter: Payer: Self-pay | Admitting: Podiatry

## 2021-09-05 ENCOUNTER — Other Ambulatory Visit: Payer: Self-pay

## 2021-09-05 DIAGNOSIS — E1142 Type 2 diabetes mellitus with diabetic polyneuropathy: Secondary | ICD-10-CM

## 2021-09-05 DIAGNOSIS — M79674 Pain in right toe(s): Secondary | ICD-10-CM

## 2021-09-05 DIAGNOSIS — M79675 Pain in left toe(s): Secondary | ICD-10-CM

## 2021-09-05 DIAGNOSIS — B351 Tinea unguium: Secondary | ICD-10-CM | POA: Diagnosis not present

## 2021-09-11 NOTE — Progress Notes (Signed)
Subjective: Felicia Shelton is a 80 y.o. female patient seen today for follow up of  painful thick toenails that are difficult to trim. Pain interferes with ambulation. Aggravating factors include wearing enclosed shoe gear. Pain is relieved with periodic professional debridement.  Patient is seen for at risk foot care with h/o diabetic neuropathy.  New problems reported today: Patient states her left 4th digit toenail came off. She notes no redness, drainage or swelling..  Patient states their blood glucose was 83 mg/dl on yesterday. Patient did not check blood glucose on today.  PCP is Dixie Dials, MD. Last visit was: May, 2022 .  No Known Allergies  Objective: Physical Exam  General: Patient is a pleasant 80 y.o. African American female in NAD. AAO x 3.   Neurovascular Examination: CFT <3 seconds b/l LE. Faintly palpable pedal pulses b/l. Pedal hair sparse. Lower extremity skin temperature gradient within normal limits. No pain with calf compression RLE. No edema noted b/l LE.  Pt has subjective symptoms of neuropathy. Protective sensation intact 5/5 intact bilaterally with 10g monofilament b/l. Vibratory sensation diminished b/l.  Dermatological:  Pedal skin is warm and supple b/l LE. No open wounds b/l LE. No interdigital macerations noted b/l LE. Toenails L hallux, L 2nd toe, L 3rd toe, R hallux, R 2nd toe, R 3rd toe, and R 4th toe elongated, discolored, dystrophic, thickened, and crumbly with subungual debris and tenderness to dorsal palpation. Anonychia noted L 4th toe, L 5th toe, and R 5th toe. Nailbed(s) epithelialized.   Musculoskeletal:  Normal muscle strength 5/5 to all lower extremity muscle groups bilaterally. HAV with bunion deformity noted b/l LE. Hammertoe deformity noted 2-5 b/l.  Assessment: 1. Pain due to onychomycosis of toenails of both feet   2. Diabetic polyneuropathy associated with type 2 diabetes mellitus ([place])    Plan: Patient was evaluated and treated and  all questions answered. Consent given for treatment as described below: -Continue diabetic foot care principles: inspect feet daily, monitor glucose as recommended by PCP and/or Endocrinologist, and follow prescribed diet per PCP, Endocrinologist and/or dietician. -Toenails L hallux, L 2nd toe, L 3rd toe, R hallux, R 2nd toe, R 3rd toe, and R 4th toe debrided in length and girth without iatrogenic bleeding with sterile nail nipper and dremel.  -Patient to report any pedal injuries to medical professional immediately. -Patient/POA to call should there be question/concern in the interim.  Return in about 3 months (around 12/06/2021).  Marzetta Board, DPM

## 2021-10-02 ENCOUNTER — Ambulatory Visit
Admission: RE | Admit: 2021-10-02 | Discharge: 2021-10-02 | Disposition: A | Discharge status: Home / Self Care | Payer: Medicare Other | Source: Ambulatory Visit | Attending: Cardiovascular Disease | Admitting: Cardiovascular Disease

## 2021-10-02 ENCOUNTER — Other Ambulatory Visit: Payer: Self-pay

## 2021-10-02 DIAGNOSIS — Z1231 Encounter for screening mammogram for malignant neoplasm of breast: Secondary | ICD-10-CM

## 2021-12-18 ENCOUNTER — Other Ambulatory Visit: Payer: Self-pay

## 2021-12-18 ENCOUNTER — Ambulatory Visit (INDEPENDENT_AMBULATORY_CARE_PROVIDER_SITE_OTHER): Payer: Medicare Other | Admitting: Podiatry

## 2021-12-18 ENCOUNTER — Encounter: Payer: Self-pay | Admitting: Podiatry

## 2021-12-18 DIAGNOSIS — M79674 Pain in right toe(s): Secondary | ICD-10-CM | POA: Diagnosis not present

## 2021-12-18 DIAGNOSIS — M2011 Hallux valgus (acquired), right foot: Secondary | ICD-10-CM

## 2021-12-18 DIAGNOSIS — E1142 Type 2 diabetes mellitus with diabetic polyneuropathy: Secondary | ICD-10-CM | POA: Diagnosis not present

## 2021-12-18 DIAGNOSIS — B351 Tinea unguium: Secondary | ICD-10-CM

## 2021-12-18 DIAGNOSIS — M2042 Other hammer toe(s) (acquired), left foot: Secondary | ICD-10-CM

## 2021-12-18 DIAGNOSIS — M2041 Other hammer toe(s) (acquired), right foot: Secondary | ICD-10-CM | POA: Diagnosis not present

## 2021-12-18 DIAGNOSIS — M79675 Pain in left toe(s): Secondary | ICD-10-CM | POA: Diagnosis not present

## 2021-12-18 DIAGNOSIS — M2012 Hallux valgus (acquired), left foot: Secondary | ICD-10-CM

## 2021-12-23 NOTE — Progress Notes (Signed)
ANNUAL DIABETIC FOOT EXAM  Subjective: Felicia Shelton presents today for for annual diabetic foot examination.  Patient relates 7 year h/o diabetes.  Patient denies any h/o foot wounds.  Patient denies any numbness, tingling, burning, or pins/needle sensation in feet.  Patient's blood sugar was 65 mg/dl on yesterday. She was symptomatic for low blood glucose and ate a donut and draink orange juice to bring her blood sugar up. Patient did not check blood glucose this morning.  Risk factors: diabetes, foot deformity, hyperlipidemia, HTN.  Dixie Dials, MD is patient's PCP. Last visit was 10/25/2020.  Past Medical History:  Diagnosis Date   Allergy    Arthritis    back   Diabetes mellitus without complication ([place])    type 2    GERD (gastroesophageal reflux disease)    Hx of adenomatous colonic polyp 05/26/2008   Hyperlipidemia    Hypertension    Neuromuscular disorder [facility])    Patient Active Problem List   Diagnosis Date Noted   Primary osteoarthritis of left knee 04/17/2021   Primary localized osteoarthritis of left knee 04/17/2021   Radiculopathy 12/31/2018   Hypertension 08/04/2012   Hx of adenomatous colonic polyp 05/26/2008   Past Surgical History:  Procedure Laterality Date   ABDOMINAL HYSTERECTOMY     APPENDECTOMY     BREAST EXCISIONAL BIOPSY Left    BREAST SURGERY     left breast   CATARACT EXTRACTION Right 02/2020   COLONOSCOPY  10/11/2015   polyps 2009   JOINT REPLACEMENT     TONSILLECTOMY     removed age 40-5   TOTAL KNEE ARTHROPLASTY Left 04/17/2021   Procedure: LEFT TOTAL KNEE ARTHROPLASTY;  Surgeon: Melrose Nakayama, MD;  Location: [facility];  Service: Orthopedics;  Laterality: Left;   TRANSFORAMINAL LUMBAR INTERBODY FUSION (TLIF) WITH PEDICLE SCREW FIXATION 2 LEVEL Right 12/31/2018   Procedure: RIGHT - SIDED LUMBAR 2-3,LUMBAR 3-4, LUMBAR 4-5 TRANSFORAMINAL LUMBAR INTERBODY FUSION WITH INSTRUMENTATION AND ALLOGRAFT;  Surgeon: Phylliss Bob, MD;  Location: [place];  Service: Orthopedics;  Laterality: Right;   Current Outpatient Medications on File Prior to Visit  Medication Sig Dispense Refill   acetaminophen (TYLENOL) 650 MG CR tablet Take 1,300 mg by mouth daily as needed for pain.     amLODipine (NORVASC) 5 MG tablet Take 5 mg by mouth daily.      Apoaequorin (PREVAGEN) 10 MG CAPS Take 10 mg by mouth daily.     aspirin EC 81 MG tablet Take 1 tablet (81 mg total) by mouth 2 (two) times daily after a meal. Swallow whole. 45 tablet 2   atorvastatin (LIPITOR) 40 MG tablet Take 40 mg by mouth daily.     cephALEXin (KEFLEX) 500 MG capsule Take 500 mg by mouth 3 (three) times daily.     Cholecalciferol (VITAMIN D-3) 5000 units TABS Take 5,000 Units by mouth every Monday, Wednesday, and Friday.      doxycycline (VIBRAMYCIN) 100 MG capsule Take 100 mg by mouth 2 (two) times daily.     gabapentin (NEURONTIN) 300 MG capsule Take 300 mg by mouth at bedtime.     glimepiride (AMARYL) 1 MG tablet Take 1 mg by mouth daily.     Homeopathic Products (THERAWORX RELIEF EX) Apply 1 application topically daily as needed (cramps).     HYDROcodone-acetaminophen (NORCO/VICODIN) 5-325 MG tablet Take 1-2 tablets by mouth every 6 (six) hours as needed for moderate pain or severe pain. 40 tablet 0   lisinopril (PRINIVIL,ZESTRIL) 20 MG tablet Take 20  mg by mouth daily.     meloxicam (MOBIC) 15 MG tablet Take 15 mg by mouth daily.     metFORMIN (GLUCOPHAGE) 500 MG tablet Take 250 mg by mouth 2 (two) times daily.     metoprolol tartrate (LOPRESSOR) 50 MG tablet Take 50 mg by mouth every evening.   0   OVER THE COUNTER MEDICATION Apply 1 application topically daily as needed (pain). Pain relief cream with lidocaine     pantoprazole (PROTONIX) 40 MG tablet Take 40 mg by mouth every evening.   1   tiZANidine (ZANAFLEX) 4 MG tablet Take 1 tablet (4 mg total) by mouth every 6 (six) hours as needed for muscle spasms. 40 tablet 1   No current facility-administered medications on file  prior to visit.    No Known Allergies Social History   Occupational History   Not on file  Tobacco Use   Smoking status: Former    Packs/day: 0.00    Years: 48.00    Pack years: 0.00    Types: Cigarettes    Quit date: 08/18/2018    Years since quitting: 3.3   Smokeless tobacco: Never  Vaping Use   Vaping Use: Never used  Substance and Sexual Activity   Alcohol use: Never    Alcohol/week: 0.0 standard drinks   Drug use: No   Sexual activity: Not on file   Family History  Adopted: Yes  Problem Relation Age of Onset   Colon cancer Maternal Aunt    Immunization History  Administered Date(s) Administered   Influenza, High Dose Seasonal PF 08/25/2014, 08/31/2015, 07/23/2017   Pneumococcal Conjugate-13 08/25/2014   Td 08/03/2012     Review of Systems: Negative except as noted in the HPI.   Objective: There were no vitals filed for this visit.  Felicia Shelton is a pleasant 81 y.o. female in NAD. AAO X 3.  Vascular Examination: CFT <3 seconds b/l LE. Faintly palpable DP pulses b/l LE. Faintly palpable PT pulse(s) b/l LE. Pedal hair sparse. No pain with calf compression b/l. Lower extremity skin temperature gradient within normal limits. No edema noted b/l LE. No cyanosis or clubbing noted b/l LE.  Dermatological Examination: Pedal integument with normal turgor, texture and tone BLE. Toenails bilateral great toes, bilateral 2nd toes, bilateral 3rd toes, and R 4th toe elongated, discolored, dystrophic, thickened, and crumbly with subungual debris and tenderness to dorsal palpation. Anonychia noted bilateral 5th toes and L 4th toe. Nailbed(s) epithelialized.   Musculoskeletal Examination: Muscle strength 5/5 to all lower extremity muscle groups bilaterally. HAV with bunion deformity noted b/l LE. Hammertoe deformity noted 2-5 b/l.  Footwear Assessment: Does the patient wear appropriate shoes? Yes. Does the patient need inserts/orthotics? No.  Neurological Examination: Pt has  subjective symptoms of neuropathy. Protective sensation intact 5/5 intact bilaterally with 10g monofilament b/l.  Assessment: 1. Pain due to onychomycosis of toenails of both feet   2. Acquired hammertoes of both feet   3. Hallux valgus, acquired, bilateral   4. Diabetic polyneuropathy associated with type 2 diabetes mellitus (HCC)    ADA Risk Categorization: Low Risk :  Patient has all of the following: Intact protective sensation No prior foot ulcer  No severe deformity Pedal pulses present  Plan: -Diabetic foot examination performed today. -Continue foot and shoe inspections daily. Monitor blood glucose per PCP/Endocrinologist's recommendations. -Mycotic toenails bilateral great toes, bilateral 2nd toes, bilateral 3rd toes, and R 4th toe were debrided in length and girth with sterile nail nippers and dremel  without iatrogenic bleeding. -Patient/POA to call should there be question/concern in the interim.  Return in about 3 months (around 03/17/2022) for diabetic nail trim.  Marzetta Board, DPM

## 2022-02-01 ENCOUNTER — Other Ambulatory Visit: Payer: Self-pay | Admitting: Nephrology

## 2022-02-01 DIAGNOSIS — N1832 Chronic kidney disease, stage 3b: Secondary | ICD-10-CM

## 2022-02-01 DIAGNOSIS — I129 Hypertensive chronic kidney disease with stage 1 through stage 4 chronic kidney disease, or unspecified chronic kidney disease: Secondary | ICD-10-CM

## 2022-02-12 ENCOUNTER — Other Ambulatory Visit: Payer: Self-pay

## 2022-02-12 ENCOUNTER — Ambulatory Visit
Admission: RE | Admit: 2022-02-12 | Discharge: 2022-02-12 | Disposition: A | Discharge status: Home / Self Care | Payer: Medicare Other | Source: Ambulatory Visit | Attending: Nephrology | Admitting: Nephrology

## 2022-02-12 DIAGNOSIS — N1832 Chronic kidney disease, stage 3b: Secondary | ICD-10-CM

## 2022-02-12 DIAGNOSIS — I129 Hypertensive chronic kidney disease with stage 1 through stage 4 chronic kidney disease, or unspecified chronic kidney disease: Secondary | ICD-10-CM

## 2022-02-21 DIAGNOSIS — K219 Gastro-esophageal reflux disease without esophagitis: Secondary | ICD-10-CM | POA: Insufficient documentation

## 2022-03-12 ENCOUNTER — Other Ambulatory Visit: Payer: Self-pay | Admitting: Nephrology

## 2022-03-12 DIAGNOSIS — N281 Cyst of kidney, acquired: Secondary | ICD-10-CM

## 2022-03-18 ENCOUNTER — Ambulatory Visit: Payer: Medicare Other | Admitting: Podiatry

## 2022-03-19 ENCOUNTER — Encounter: Payer: Self-pay | Admitting: Podiatry

## 2022-03-19 ENCOUNTER — Ambulatory Visit (INDEPENDENT_AMBULATORY_CARE_PROVIDER_SITE_OTHER): Payer: Medicare Other | Admitting: Podiatry

## 2022-03-19 DIAGNOSIS — M79674 Pain in right toe(s): Secondary | ICD-10-CM

## 2022-03-19 DIAGNOSIS — M79675 Pain in left toe(s): Secondary | ICD-10-CM | POA: Diagnosis not present

## 2022-03-19 DIAGNOSIS — B351 Tinea unguium: Secondary | ICD-10-CM | POA: Diagnosis not present

## 2022-03-19 DIAGNOSIS — E1142 Type 2 diabetes mellitus with diabetic polyneuropathy: Secondary | ICD-10-CM | POA: Diagnosis not present

## 2022-03-19 DIAGNOSIS — I739 Peripheral vascular disease, unspecified: Secondary | ICD-10-CM

## 2022-03-28 NOTE — Progress Notes (Signed)
?  Subjective:  ?Patient ID: Felicia Shelton, female    DOB: Dec 16, 1940,  MRN: [id number] ? ?Felicia Shelton presents to clinic today for at risk foot care with history of diabetic neuropathy and painful elongated mycotic toenails 1-5 bilaterally which are tender when wearing enclosed shoe gear. Pain is relieved with periodic professional debridement. ? ?Last known HgA1c was 6.4%. Patient did not check blood glucose today. ? ?New problem(s): None.  ? ?PCP is Long, [place], PA-C , and last visit was April, 2023. ? ?No Known Allergies ? ?Review of Systems: Negative except as noted in the HPI. ? ?Objective: No changes noted in today's physical examination. ? ?There were no vitals filed for this visit. ? ?Felicia Shelton is a pleasant 81 y.o. female in NAD. AAO X 3. ? ?Vascular Examination: ?CFT <3 seconds b/l LE. Faintly palpable DP pulses b/l LE. Faintly palpable PT pulse(s) b/l LE. Pedal hair sparse. No pain with calf compression b/l. Lower extremity skin temperature gradient within normal limits. No edema noted b/l LE. No cyanosis or clubbing noted b/l LE. ? ?Dermatological Examination: ?Pedal integument with normal turgor, texture and tone BLE. Toenails bilateral great toes, bilateral 2nd toes, bilateral 3rd toes, and R 4th toe elongated, discolored, dystrophic, thickened, and crumbly with subungual debris and tenderness to dorsal palpation. Anonychia noted bilateral 5th toes and L 4th toe. Nailbed(s) epithelialized.  ? ?Musculoskeletal Examination: ?Muscle strength 5/5 to all lower extremity muscle groups bilaterally. HAV with bunion deformity noted b/l LE. Hammertoe deformity noted 2-5 b/l. ? ?Neurological Examination: ?Pt has subjective symptoms of neuropathy. Protective sensation intact 5/5 intact bilaterally with 10g monofilament b/l. ? ?Assessment/Plan: ?1. Pain due to onychomycosis of toenails of both feet   ?2. PAD (peripheral artery disease) ([place])   ?3. Diabetic polyneuropathy associated with type 2 diabetes mellitus  ([place])   ?  ?-Patient was evaluated and treated. All patient's and/or POA's questions/concerns answered on today's visit. ?-Continue diabetic foot care principles: inspect feet daily, monitor glucose as recommended by PCP and/or Endocrinologist, and follow prescribed diet per PCP, Endocrinologist and/or dietician. ?-Patient to continue soft, supportive shoe gear daily. ?-Toenails 1-5 b/l were debrided in length and girth with sterile nail nippers and dremel without iatrogenic bleeding.  ?-Patient/POA to call should there be question/concern in the interim.  ? ?Return in about 3 months (around 06/19/2022). ? ?Marzetta Board, DPM  ?

## 2022-03-30 ENCOUNTER — Ambulatory Visit
Admission: RE | Admit: 2022-03-30 | Discharge: 2022-03-30 | Disposition: A | Discharge status: Home / Self Care | Payer: Medicare Other | Source: Ambulatory Visit | Attending: Nephrology | Admitting: Nephrology

## 2022-03-30 DIAGNOSIS — N281 Cyst of kidney, acquired: Secondary | ICD-10-CM

## 2022-04-23 DIAGNOSIS — J300 Vasomotor rhinitis: Secondary | ICD-10-CM | POA: Diagnosis not present

## 2022-05-01 DIAGNOSIS — Z13228 Encounter for screening for other metabolic disorders: Secondary | ICD-10-CM | POA: Diagnosis not present

## 2022-05-01 DIAGNOSIS — E11649 Type 2 diabetes mellitus with hypoglycemia without coma: Secondary | ICD-10-CM | POA: Diagnosis not present

## 2022-05-01 DIAGNOSIS — Z0000 Encounter for general adult medical examination without abnormal findings: Secondary | ICD-10-CM | POA: Diagnosis not present

## 2022-05-01 DIAGNOSIS — Z1211 Encounter for screening for malignant neoplasm of colon: Secondary | ICD-10-CM | POA: Diagnosis not present

## 2022-06-04 DIAGNOSIS — Z1211 Encounter for screening for malignant neoplasm of colon: Secondary | ICD-10-CM | POA: Diagnosis not present

## 2022-06-07 DIAGNOSIS — R5381 Other malaise: Secondary | ICD-10-CM | POA: Diagnosis not present

## 2022-06-07 DIAGNOSIS — Z87891 Personal history of nicotine dependence: Secondary | ICD-10-CM | POA: Diagnosis not present

## 2022-06-07 DIAGNOSIS — N189 Chronic kidney disease, unspecified: Secondary | ICD-10-CM | POA: Diagnosis not present

## 2022-06-07 DIAGNOSIS — R8271 Bacteriuria: Secondary | ICD-10-CM | POA: Diagnosis not present

## 2022-06-07 DIAGNOSIS — E11649 Type 2 diabetes mellitus with hypoglycemia without coma: Secondary | ICD-10-CM | POA: Diagnosis not present

## 2022-06-11 LAB — COLOGUARD: COLOGUARD: POSITIVE — AB

## 2022-06-25 ENCOUNTER — Ambulatory Visit (INDEPENDENT_AMBULATORY_CARE_PROVIDER_SITE_OTHER): Payer: Medicare Other | Admitting: Podiatry

## 2022-06-25 ENCOUNTER — Encounter: Payer: Self-pay | Admitting: Podiatry

## 2022-06-25 DIAGNOSIS — M79674 Pain in right toe(s): Secondary | ICD-10-CM

## 2022-06-25 DIAGNOSIS — I739 Peripheral vascular disease, unspecified: Secondary | ICD-10-CM

## 2022-06-25 DIAGNOSIS — M79675 Pain in left toe(s): Secondary | ICD-10-CM | POA: Diagnosis not present

## 2022-06-25 DIAGNOSIS — E1142 Type 2 diabetes mellitus with diabetic polyneuropathy: Secondary | ICD-10-CM | POA: Diagnosis not present

## 2022-06-25 DIAGNOSIS — B351 Tinea unguium: Secondary | ICD-10-CM | POA: Diagnosis not present

## 2022-06-25 NOTE — Progress Notes (Signed)
  Subjective:  Patient ID: Felicia Shelton, female    DOB: 18-Jan-1941,  MRN: [id number]  Felicia Shelton presents to clinic today for at risk foot care with history of diabetic neuropathy and painful thick toenails that are difficult to trim. Pain interferes with ambulation. Aggravating factors include wearing enclosed shoe gear. Pain is relieved with periodic professional debridement.  Patient states blood glucose was 150 mg/dl today.  Last A1c was 6.2%.  Patient states her neuropathy is bothering her more at times.  PCP is Long, [place], PA-C , and last visit was  June 07, 2022  No Known Allergies  Review of Systems: Negative except as noted in the HPI.  Objective: No changes noted in today's physical examination. There were no vitals filed for this visit.  Felicia Shelton is a pleasant 81 y.o. female in NAD. AAO X 3.  Vascular Examination: CFT <3 seconds b/l LE. Faintly palpable DP pulses b/l LE. Faintly palpable PT pulse(s) b/l LE. Pedal hair sparse. No pain with calf compression b/l. Lower extremity skin temperature gradient within normal limits. No edema noted b/l LE. No cyanosis or clubbing noted b/l LE.  Dermatological Examination: Pedal integument with normal turgor, texture and tone BLE. Toenails 1-5 b/l elongated, discolored, dystrophic, thickened, and crumbly with subungual debris and tenderness to dorsal palpation.   Musculoskeletal Examination: Muscle strength 5/5 to all lower extremity muscle groups bilaterally. HAV with bunion deformity noted b/l LE. Hammertoe deformity noted 2-5 b/l.  Neurological Examination: Pt has subjective symptoms of neuropathy. Protective sensation intact 5/5 intact bilaterally with 10g monofilament b/l.  Assessment/Plan: 1. Pain due to onychomycosis of toenails of both feet   2. PAD (peripheral artery disease) ([place])   3. Diabetic polyneuropathy associated with type 2 diabetes mellitus ([place])     -Examined patient. -Discussed treatment for mycotic  toenails She is to apply tea tree oil to nails once daily. For increased neuropathy symptoms, recommended Nervive Cream before bedtime. -Patient to continue soft, supportive shoe gear daily. -Mycotic toenails 1-5 bilaterally were debrided in length and girth with sterile nail nippers and dremel without incident. -Patient/POA to call should there be question/concern in the interim.   Return in about 3 months (around 09/25/2022).  Marzetta Board, DPM

## 2022-07-03 ENCOUNTER — Encounter: Payer: Self-pay | Admitting: Internal Medicine

## 2022-07-03 ENCOUNTER — Ambulatory Visit: Payer: Medicare Other | Admitting: Internal Medicine

## 2022-07-03 VITALS — BP 152/64 | HR 98 | Ht 65.0 in | Wt 184.0 lb

## 2022-07-03 DIAGNOSIS — R195 Other fecal abnormalities: Secondary | ICD-10-CM

## 2022-07-03 DIAGNOSIS — Z86010 Personal history of colonic polyps: Secondary | ICD-10-CM

## 2022-07-03 DIAGNOSIS — R9431 Abnormal electrocardiogram [ECG] [EKG]: Secondary | ICD-10-CM

## 2022-07-03 NOTE — Progress Notes (Signed)
VAISHALI BAISE 81 y.o. [birth year] [id number]  Assessment & Plan:   Encounter Diagnoses  Name Primary?   Positive colorectal cancer screening using Cologuard test Yes   Hx of adenomatous colonic polyps    Nonspecific abnormal electrocardiogram (ECG) (EKG)    I suppose a colonoscopy is appropriate given that we do not know why her Cologuard test is positive.  It is unlikely but not impossible that there is a serious problem related to this.  Patient is aware of the situation.  So we will plan on a colonoscopy though I have a low suspicion that there were any significant cardiac issues based upon her current functional status and lack of chest pain or dyspnea, I will await cardiology evaluation prior to scheduling a colonoscopy with deep sedation.  She is advised to contact us if she does not hear from [place] after cardiology work-up is completed and she is cleared for deep sedation.  I do not have a copy of the EKG abnormality.  CC: Long, [place], PA-C Dr. Adrian Prows   Subjective:   Chief Complaint: Positive Cologuard  HPI 81 year old African-American woman with a history of adenomatous colonic polyps who had Cologuard testing sent by primary care this year and it returned positive.  Last colonoscopy June 2021 and 7 diminutive polyps were removed, 4 of which were adenomas.  No routine repeat colonoscopy was recommended at her age and with those findings.  She has not seen any signs of rectal bleeding or melena.  She denies abdominal pain or bowel habit changes.  She was fatigued and had an abnormal EKG.  She has been referred to Dr. Joyce Gross practice for evaluation and that appointment is tomorrow.  After she has left I have obtained the records from her visit in July and reviewed that.  Lab testing showed hemoglobin A1c 6.2 on July 21, B12 748 folate 20.1, hemoglobin 12 MCV 86 normal white count and platelet count.  GFR was 41 with a creatinine of 1.33.  Otherwise electrolytes and chemistries  normal.  Ferritin 95 with a normal iron sat and TIBC. No Known Allergies Current Meds  Medication Sig   acetaminophen (TYLENOL) 650 MG CR tablet Take 1,300 mg by mouth daily as needed for pain.   amLODipine (NORVASC) 5 MG tablet Take 5 mg by mouth daily.    Apoaequorin (PREVAGEN) 10 MG CAPS Take 10 mg by mouth daily.   atorvastatin (LIPITOR) 40 MG tablet Take 40 mg by mouth daily.   b complex vitamins capsule Take 1 capsule by mouth daily.   Cholecalciferol (VITAMIN D-3) 5000 units TABS Take 5,000 Units by mouth every Monday, Wednesday, and Friday.    fexofenadine-pseudoephedrine (ALLEGRA-D) 60-120 MG 12 hr tablet Take by mouth as needed.   gabapentin (NEURONTIN) 300 MG capsule Take 300 mg by mouth daily.   glimepiride (AMARYL) 1 MG tablet Take 0.5 mg by mouth daily.   Homeopathic Products (THERAWORX RELIEF EX) Apply 1 application topically daily as needed (cramps).   lisinopril (ZESTRIL) 20 MG tablet Take 1 tablet by mouth daily.   metFORMIN (GLUCOPHAGE) 500 MG tablet Take 250 mg by mouth 2 (two) times daily.   metoprolol succinate (TOPROL-XL) 50 MG 24 hr tablet Take 50 mg by mouth daily.   pantoprazole (PROTONIX) 40 MG tablet Take 40 mg by mouth daily.   Past Medical History:  Diagnosis Date   Allergy    Arthritis    back   Diabetes mellitus without complication ([place])    type 2  GERD (gastroesophageal reflux disease)    Hx of adenomatous colonic polyp 05/26/2008   Hyperlipidemia    Hypertension    Neuromuscular disorder [facility])    Past Surgical History:  Procedure Laterality Date   ABDOMINAL HYSTERECTOMY     APPENDECTOMY     BREAST EXCISIONAL BIOPSY Left    BREAST SURGERY     left breast   CATARACT EXTRACTION Right 02/2020   COLONOSCOPY  10/11/2015   polyps 2009   JOINT REPLACEMENT     TONSILLECTOMY     removed age 82-5   TOTAL KNEE ARTHROPLASTY Left 04/17/2021   Procedure: LEFT TOTAL KNEE ARTHROPLASTY;  Surgeon: Melrose Nakayama, MD;  Location: [facility];  Service:  Orthopedics;  Laterality: Left;   TRANSFORAMINAL LUMBAR INTERBODY FUSION (TLIF) WITH PEDICLE SCREW FIXATION 2 LEVEL Right 12/31/2018   Procedure: RIGHT - SIDED LUMBAR 2-3,LUMBAR 3-4, LUMBAR 4-5 TRANSFORAMINAL LUMBAR INTERBODY FUSION WITH INSTRUMENTATION AND ALLOGRAFT;  Surgeon: Phylliss Bob, MD;  Location: [place];  Service: Orthopedics;  Laterality: Right;   Social History   Social History Narrative   Patient is married   Former smoker never alcohol never drug use   family history includes Colon cancer in her maternal aunt. She was adopted.   Review of Systems As per HPI otherwise negative  Objective:   Physical Exam BP (!) 152/64   Pulse 98   Ht '5\' 5"'$  (1.651 m)   Wt 184 lb (83.5 kg)   BMI 30.62 kg/m  Elderly bw NAD Lungs cta Cor NL S1S2 no rmg

## 2022-07-03 NOTE — Patient Instructions (Signed)
_______________________________________________________  If you are age 80 or older, your body mass index should be between 23-30. Your Body mass index is 30.62 kg/m. If this is out of the aforementioned range listed, please consider follow up with your Primary Care Provider.  If you are age 103 or younger, your body mass index should be between 19-25. Your Body mass index is 30.62 kg/m. If this is out of the aformentioned range listed, please consider follow up with your Primary Care Provider.   ________________________________________________________  The [facility] providers would like to encourage you to use [facility] to communicate with providers for non-urgent requests or questions.  Due to long hold times on the telephone, sending your provider a message by [facility] may be a faster and more efficient way to get a response.  Please allow 48 business hours for a response.  Please remember that this is for non-urgent requests.  _______________________________________________________   Please call us once you are seen by your cardiologist to tell us if you are cleared to have a procedure or not.  Thank you for choosing me and [facility].  Gatha Mayer, M.D., [facility]

## 2022-07-04 ENCOUNTER — Ambulatory Visit: Payer: Medicare Other | Admitting: Cardiology

## 2022-07-04 ENCOUNTER — Encounter: Payer: Self-pay | Admitting: Cardiology

## 2022-07-04 VITALS — BP 147/68 | HR 88 | Temp 98.1°F | Resp 16 | Ht 65.0 in | Wt 185.0 lb

## 2022-07-04 DIAGNOSIS — I10 Essential (primary) hypertension: Secondary | ICD-10-CM | POA: Diagnosis not present

## 2022-07-04 DIAGNOSIS — R9431 Abnormal electrocardiogram [ECG] [EKG]: Secondary | ICD-10-CM

## 2022-07-04 DIAGNOSIS — E7800 Pure hypercholesterolemia, unspecified: Secondary | ICD-10-CM

## 2022-07-04 DIAGNOSIS — R012 Other cardiac sounds: Secondary | ICD-10-CM

## 2022-07-04 DIAGNOSIS — I517 Cardiomegaly: Secondary | ICD-10-CM

## 2022-07-04 NOTE — Progress Notes (Signed)
Primary Physician/Referring:  Shanon Rosser, PA-C  Patient ID: Felicia Shelton, female    DOB: [birth year], 81 y.o.   MRN: [id number]  Chief Complaint  Patient presents with   Abnormal ECG   New Patient (Initial Visit)   HPI:    Felicia Shelton  is a 81 y.o. African-American female patient who is fairly active, underwent left knee replacement last year, has still right knee arthropathy, hypertension which is well controlled, diabetes mellitus type 2 with stage IIIa-b chronic kidney disease, hyperlipidemia on therapy, referred to me for evaluation of abnormal EKG.  Although not very active, relatively functional and does household activities and routine chores without any limitations without chest pain or dyspnea.   Past Medical History:  Diagnosis Date   Allergy    Arthritis    back   Diabetes mellitus without complication ([place])    type 2    GERD (gastroesophageal reflux disease)    Hx of adenomatous colonic polyp 05/26/2008   Hyperlipidemia    Hypertension    Neuromuscular disorder ([place])    Past Surgical History:  Procedure Laterality Date   ABDOMINAL HYSTERECTOMY     APPENDECTOMY     BREAST EXCISIONAL BIOPSY Left    BREAST SURGERY     left breast   CATARACT EXTRACTION Right 02/2020   COLONOSCOPY  10/11/2015   polyps 2009   JOINT REPLACEMENT     TONSILLECTOMY     removed age 73-5   TOTAL KNEE ARTHROPLASTY Left 04/17/2021   Procedure: LEFT TOTAL KNEE ARTHROPLASTY;  Surgeon: Melrose Nakayama, MD;  Location: [facility];  Service: Orthopedics;  Laterality: Left;   TRANSFORAMINAL LUMBAR INTERBODY FUSION (TLIF) WITH PEDICLE SCREW FIXATION 2 LEVEL Right 12/31/2018   Procedure: RIGHT - SIDED LUMBAR 2-3,LUMBAR 3-4, LUMBAR 4-5 TRANSFORAMINAL LUMBAR INTERBODY FUSION WITH INSTRUMENTATION AND ALLOGRAFT;  Surgeon: Phylliss Bob, MD;  Location: [facility];  Service: Orthopedics;  Laterality: Right;   Family History  Adopted: Yes  Problem Relation Age of Onset   Colon cancer Maternal Aunt     Social  History   Tobacco Use   Smoking status: Former    Packs/day: 0.00    Years: 48.00    Total pack years: 0.00    Types: Cigarettes    Quit date: 08/18/2018    Years since quitting: 3.8   Smokeless tobacco: Never  Substance Use Topics   Alcohol use: Never    Alcohol/week: 0.0 standard drinks of alcohol   Marital Status: Married  ROS  Review of Systems  Cardiovascular:  Negative for chest pain, dyspnea on exertion and leg swelling.   Objective      07/04/2022    1:16 PM 07/04/2022   12:56 PM 07/03/2022   10:42 AM  Vitals with BMI  Height  5' 5"  5' 5"   Weight  185 lbs 184 lbs  BMI  42.87 68.11  Systolic 572 620 355  Diastolic 68 77 64  Pulse  88 98   Today's Vitals   07/04/22 1256 07/04/22 1316  BP: (!) 151/77 (!) 147/68  Pulse: 88   Resp: 16   Temp: 98.1 F (36.7 C)   TempSrc: Temporal   SpO2: 96%   Weight: 185 lb (83.9 kg)   Height: 5' 5"  (1.651 m)    Body mass index is 30.79 kg/m.  Physical Exam Neck:     Vascular: No carotid bruit or JVD.  Cardiovascular:     Rate and Rhythm: Normal rate and regular rhythm.  Pulses: Intact distal pulses.     Heart sounds: Normal heart sounds. No murmur heard.    No gallop.  Pulmonary:     Effort: Pulmonary effort is normal.     Breath sounds: Normal breath sounds.  Abdominal:     General: Bowel sounds are normal.     Palpations: Abdomen is soft.  Musculoskeletal:     Right lower leg: No edema.     Left lower leg: No edema.    Medications and allergies  No Known Allergies   Medication list after today's encounter   Current Outpatient Medications:    acetaminophen (TYLENOL) 650 MG CR tablet, Take 1,300 mg by mouth daily as needed for pain., Disp: , Rfl:    amLODipine (NORVASC) 5 MG tablet, Take 5 mg by mouth daily. , Disp: , Rfl:    atorvastatin (LIPITOR) 40 MG tablet, Take 40 mg by mouth daily., Disp: , Rfl:    b complex vitamins capsule, Take 1 capsule by mouth daily., Disp: , Rfl:    Cholecalciferol  (VITAMIN D-3) 5000 units TABS, Take 5,000 Units by mouth every Monday, Wednesday, and Friday. , Disp: , Rfl:    fexofenadine-pseudoephedrine (ALLEGRA-D) 60-120 MG 12 hr tablet, Take by mouth as needed., Disp: , Rfl:    gabapentin (NEURONTIN) 300 MG capsule, Take 300 mg by mouth daily., Disp: , Rfl:    glimepiride (AMARYL) 1 MG tablet, Take 0.5 mg by mouth daily., Disp: , Rfl:    Homeopathic Products ([place]), Apply 1 application topically daily as needed (cramps)., Disp: , Rfl:    lisinopril (ZESTRIL) 20 MG tablet, Take 1 tablet by mouth daily., Disp: , Rfl:    metFORMIN (GLUCOPHAGE) 500 MG tablet, Take 250 mg by mouth 2 (two) times daily., Disp: , Rfl:    metoprolol succinate (TOPROL-XL) 50 MG 24 hr tablet, Take 50 mg by mouth daily., Disp: , Rfl:    Multiple Vitamins-Minerals (CENTRUM ADULT PO), Take by mouth., Disp: , Rfl:    OVER THE COUNTER MEDICATION, Apply 1 application topically daily as needed (pain). Pain relief cream with lidocaine, Disp: , Rfl:    fluticasone (FLONASE) 50 MCG/ACT nasal spray, fluticasone propionate 50 mcg/actuation nasal spray,suspension  USE 2 SPRAY(S) IN EACH NOSTRIL ONCE DAILY FOR 30 DAYS (Patient not taking: Reported on 07/03/2022), Disp: , Rfl:   Laboratory examination:   Lab Results  Component Value Date   NA 140 04/17/2021   K 3.3 (L) 04/17/2021   CO2 25 04/17/2021   GLUCOSE 136 (H) 04/17/2021   BUN 16 04/17/2021   CREATININE 1.43 (H) 04/17/2021   CALCIUM 9.1 04/17/2021   GFRNONAA 38 (L) 04/17/2021       Latest Ref Rng & Units 04/17/2021    7:55 AM 12/29/2018   10:13 AM  BMP  Glucose 70 - 99 mg/dL 136  146   BUN 8 - 23 mg/dL 16  19   Creatinine 0.44 - 1.00 mg/dL 1.43  1.35   Sodium 135 - 145 mmol/L 140  140   Potassium 3.5 - 5.1 mmol/L 3.3  3.9   Chloride 98 - 111 mmol/L 106  109   CO2 22 - 32 mmol/L 25  20   Calcium 8.9 - 10.3 mg/dL 9.1  9.6       Latest Ref Rng & Units 04/17/2021    7:55 AM 12/29/2018   10:13 AM  Hepatic  Function  Total Protein 6.5 - 8.1 g/dL 6.9  6.9   Albumin 3.5 -  5.0 g/dL 4.0  3.8   AST 15 - 41 U/L 24  22   ALT 0 - 44 U/L 19  20   Alk Phosphatase 38 - 126 U/L 77  69   Total Bilirubin 0.3 - 1.2 mg/dL 0.5  0.8     External labs:   Labs 06/08/2022:   A1c 6.2%  TSH 0.9, normal.  Hb 12.0/HCT 36.7, platelets 221, normal indicis.  Serum glucose 89, BUN 18, creatinine 1.33, EGFR 41, potassium 3.8, LFTs normal.  Radiology:    Cardiac Studies:   NA  EKG:   EKG 07/04/2022: Normal sinus rhythm at rate of 85 bpm, biatrial enlargement, normal axis.  No evidence of ischemia.  No significant change from PCP EKG 06/07/2022.  Compared to 04/17/2021, previously EKG was normal.  Assessment     ICD-10-CM   1. Nonspecific abnormal electrocardiogram (ECG) (EKG)  R94.31 EKG 12-Lead    2. Biatrial enlargement  I51.7     3. Primary hypertension  I10     4. Pure hypercholesterolemia  E78.00       Orders Placed This Encounter  Procedures   EKG 12-Lead   No orders of the defined types were placed in this encounter.  Medications Discontinued During This Encounter  Medication Reason   Apoaequorin (PREVAGEN) 10 MG CAPS    ipratropium (ATROVENT) 0.03 % nasal spray    meloxicam (MOBIC) 15 MG tablet    pantoprazole (PROTONIX) 40 MG tablet    tiZANidine (ZANAFLEX) 4 MG tablet      Recommendations:   Felicia Shelton is a 81 y.o. African-American female patient who is fairly active, underwent left knee replacement last year, has still right knee arthropathy, hypertension which is well controlled, diabetes mellitus type 2 with stage IIIa-b chronic kidney disease, hyperlipidemia on therapy, referred to me for evaluation of abnormal EKG.  Although not very active, relatively functional and does household activities and routine chores without any limitations without chest pain or dyspnea.  Physical examination except for abnormal heart sounds in the form of extrasystolic heart sounds is unremarkable  including vascular examination.  Clearly the EKG abnormalities of biatrial enlargement is new, possible etiologies include valvular heart disease, longstanding hypertension.  It is very unusual to see biatrial enlargement and longstanding hypertension or chronic diastolic heart failure unless it is very progressed.  There is no clinical evidence of heart failure.  There is no clinical evidence of right-sided heart failure or right ventricular strain by auscultation as well and by physical exam.  In view of abnormal heart sounds, abnormal EKG, will obtain echocardiogram.  I would like to see her back after this.  I do not want to do any stress testing as she is asymptomatic and has undergone surgery a year ago without periprocedural complications.  Blood pressure is elevated today however mostly blood pressure has been well controlled as per patient, hence I did not make any changes to her medicines today but will consider increasing the dose of amlodipine to 10 mg if elevated blood pressure persists.  Labs reviewed, diabetes is well controlled, lipids are being managed by PCP, I do not have recent labs.  I will see her back after the echocardiogram and make further recommendations.  Patient is scheduled for colonoscopy by Dr. Silvano Rusk, I do not see any contraindication for her to proceed with colonoscopy with low risk.    Adrian Prows, MD, [facility] 07/04/2022, 1:29 PM Office: [phone]

## 2022-07-07 ENCOUNTER — Encounter: Payer: Self-pay | Admitting: Cardiology

## 2022-07-09 ENCOUNTER — Telehealth: Payer: Self-pay | Admitting: Internal Medicine

## 2022-07-09 NOTE — Telephone Encounter (Signed)
PT is trying to schedule colonoscopy but is wanting to know if we received clearance from her cardiologist to do so. Please advise

## 2022-07-10 NOTE — Telephone Encounter (Signed)
Patient called to schedule colonoscopy but is waiting to know if we received clearance from her cardiologist to schedule the appointment. Please call to advise.

## 2022-07-11 ENCOUNTER — Encounter: Payer: Self-pay | Admitting: Internal Medicine

## 2022-07-11 NOTE — Telephone Encounter (Signed)
Pt has cardiac clearance, see note below:  Patient is scheduled for colonoscopy by Dr. Silvano Rusk, I do not see any contraindication for her to proceed with colonoscopy with low risk.      Adrian Prows, MD, [facility] 07/04/2022, 1:29 PM Office: [phone]  Please call pt and schedule her for previsit and colon with Dr. Carlean Purl.

## 2022-07-12 NOTE — Telephone Encounter (Signed)
PV- 08/09/2022 2:30pm;  Colonoscopy- 09/03/2022 2pm

## 2022-07-29 ENCOUNTER — Telehealth: Payer: Self-pay

## 2022-07-29 ENCOUNTER — Encounter: Payer: Self-pay | Admitting: Internal Medicine

## 2022-07-29 NOTE — Telephone Encounter (Signed)
Dr.Gessner, This patient has been scheduled for a Pre Visit appt on 08/09/2022.  However, there is a letter in [place] on 04/2020 stating she does not need any further testing due to her increased age.  Please advise if this patient should continue as scheduled for her colonoscopy or if her PV on 08/09/2022 and colon on 09/03/2022 need to be cancelled. Please/Thank you

## 2022-07-29 NOTE — Telephone Encounter (Signed)
See 06/2022 office visit  She has + cologuard so getting repeat colonoscopy

## 2022-08-05 NOTE — Telephone Encounter (Signed)
Noted on PV chart-resolved

## 2022-08-07 ENCOUNTER — Encounter: Payer: Self-pay | Admitting: Internal Medicine

## 2022-08-07 DIAGNOSIS — R059 Cough, unspecified: Secondary | ICD-10-CM | POA: Diagnosis not present

## 2022-08-07 DIAGNOSIS — U071 COVID-19: Secondary | ICD-10-CM | POA: Diagnosis not present

## 2022-08-21 ENCOUNTER — Ambulatory Visit: Payer: Medicare Other

## 2022-08-21 DIAGNOSIS — I517 Cardiomegaly: Secondary | ICD-10-CM

## 2022-08-21 DIAGNOSIS — R012 Other cardiac sounds: Secondary | ICD-10-CM | POA: Diagnosis not present

## 2022-08-21 DIAGNOSIS — R9431 Abnormal electrocardiogram [ECG] [EKG]: Secondary | ICD-10-CM | POA: Diagnosis not present

## 2022-08-22 DIAGNOSIS — E785 Hyperlipidemia, unspecified: Secondary | ICD-10-CM | POA: Diagnosis not present

## 2022-08-22 DIAGNOSIS — E11649 Type 2 diabetes mellitus with hypoglycemia without coma: Secondary | ICD-10-CM | POA: Diagnosis not present

## 2022-08-22 DIAGNOSIS — N189 Chronic kidney disease, unspecified: Secondary | ICD-10-CM | POA: Diagnosis not present

## 2022-08-22 DIAGNOSIS — I10 Essential (primary) hypertension: Secondary | ICD-10-CM | POA: Diagnosis not present

## 2022-08-23 ENCOUNTER — Other Ambulatory Visit: Payer: Self-pay | Admitting: Physician Assistant

## 2022-08-23 DIAGNOSIS — Z1231 Encounter for screening mammogram for malignant neoplasm of breast: Secondary | ICD-10-CM

## 2022-08-25 DIAGNOSIS — N189 Chronic kidney disease, unspecified: Secondary | ICD-10-CM | POA: Insufficient documentation

## 2022-08-26 ENCOUNTER — Ambulatory Visit (AMBULATORY_SURGERY_CENTER): Payer: Self-pay

## 2022-08-26 VITALS — Ht 65.0 in | Wt 180.0 lb

## 2022-08-26 DIAGNOSIS — R195 Other fecal abnormalities: Secondary | ICD-10-CM

## 2022-08-26 DIAGNOSIS — Z86010 Personal history of colonic polyps: Secondary | ICD-10-CM

## 2022-08-26 NOTE — Progress Notes (Signed)
No egg or soy allergy known to patient  No issues known to pt with past sedation with any surgeries or procedures Patient denies ever being told they had issues or difficulty with intubation  No FH of Malignant Hyperthermia Pt is not on diet pills Pt is not on  home 02  Pt is not on blood thinners  Pt denies issues with constipation  No A fib or A flutter Have any cardiac testing pending--denied Pt instructed to use [email or web address] or GoodRx for a price reduction on prep   

## 2022-08-28 DIAGNOSIS — N281 Cyst of kidney, acquired: Secondary | ICD-10-CM | POA: Diagnosis not present

## 2022-08-28 DIAGNOSIS — E1122 Type 2 diabetes mellitus with diabetic chronic kidney disease: Secondary | ICD-10-CM | POA: Diagnosis not present

## 2022-08-28 DIAGNOSIS — N1832 Chronic kidney disease, stage 3b: Secondary | ICD-10-CM | POA: Diagnosis not present

## 2022-08-28 DIAGNOSIS — I129 Hypertensive chronic kidney disease with stage 1 through stage 4 chronic kidney disease, or unspecified chronic kidney disease: Secondary | ICD-10-CM | POA: Diagnosis not present

## 2022-09-03 ENCOUNTER — Encounter: Payer: Medicare Other | Admitting: Internal Medicine

## 2022-09-04 ENCOUNTER — Ambulatory Visit: Payer: Medicare Other | Admitting: Cardiology

## 2022-09-04 ENCOUNTER — Encounter: Payer: Self-pay | Admitting: Cardiology

## 2022-09-04 VITALS — BP 145/81 | HR 76 | Temp 96.0°F | Resp 16 | Ht 65.0 in | Wt 183.0 lb

## 2022-09-04 DIAGNOSIS — E7800 Pure hypercholesterolemia, unspecified: Secondary | ICD-10-CM

## 2022-09-04 DIAGNOSIS — N1832 Chronic kidney disease, stage 3b: Secondary | ICD-10-CM | POA: Diagnosis not present

## 2022-09-04 DIAGNOSIS — R0609 Other forms of dyspnea: Secondary | ICD-10-CM

## 2022-09-04 DIAGNOSIS — R9431 Abnormal electrocardiogram [ECG] [EKG]: Secondary | ICD-10-CM

## 2022-09-04 DIAGNOSIS — I10 Essential (primary) hypertension: Secondary | ICD-10-CM

## 2022-09-04 MED ORDER — AMLODIPINE BESYLATE 10 MG PO TABS: 10.0000 mg | ORAL_TABLET | Freq: Every day | ORAL | 3 refills | Status: DC

## 2022-09-04 MED ORDER — POTASSIUM CHLORIDE ER 10 MEQ PO TBCR: 10.0000 meq | EXTENDED_RELEASE_TABLET | ORAL | 2 refills | Status: DC

## 2022-09-04 MED ORDER — CHLORTHALIDONE 25 MG PO TABS: 25.0000 mg | ORAL_TABLET | ORAL | 2 refills | Status: DC

## 2022-09-04 NOTE — Progress Notes (Signed)
Primary Physician/Referring:  Shanon Rosser, PA-C  Patient ID: Felicia Shelton, female    DOB: 1941-08-18, 81 y.o.   MRN: [id number]  Chief Complaint  Patient presents with   Abnormal heart sounds   Abnormal ECG   Follow-up    2 months   HPI:    Felicia Shelton  is a 81 y.o. African-American female patient who is fairly active, underwent left knee replacement last year, has still right knee arthropathy, hypertension which is well controlled, diabetes mellitus type 2 with stage IIIa-b chronic kidney disease, hyperlipidemia on therapy, presents for follow-up of difficult to control hypertension, abnormal EKG.  Although not very active, relatively functional and does household activities and routine chores without any limitations but activity limited by arthritis. Now admits to dyspnea on exertion and describes it as mild. No PND or orthopnea.   Past Medical History:  Diagnosis Date   Allergy    Arthritis    back   Diabetes mellitus without complication ([place])    type 2    GERD (gastroesophageal reflux disease)    Hx of adenomatous colonic polyp 05/26/2008   Hyperlipidemia    Hypertension    Neuromuscular disorder ([place])    Past Surgical History:  Procedure Laterality Date   ABDOMINAL HYSTERECTOMY     APPENDECTOMY     BREAST EXCISIONAL BIOPSY Left    BREAST SURGERY     left breast   CATARACT EXTRACTION Right 02/2020   COLONOSCOPY  10/11/2015   polyps 2009   JOINT REPLACEMENT     TONSILLECTOMY     removed age 35-5   TOTAL KNEE ARTHROPLASTY Left 04/17/2021   Procedure: LEFT TOTAL KNEE ARTHROPLASTY;  Surgeon: Melrose Nakayama, MD;  Location: [facility];  Service: Orthopedics;  Laterality: Left;   TRANSFORAMINAL LUMBAR INTERBODY FUSION (TLIF) WITH PEDICLE SCREW FIXATION 2 LEVEL Right 12/31/2018   Procedure: RIGHT - SIDED LUMBAR 2-3,LUMBAR 3-4, LUMBAR 4-5 TRANSFORAMINAL LUMBAR INTERBODY FUSION WITH INSTRUMENTATION AND ALLOGRAFT;  Surgeon: Phylliss Bob, MD;  Location: [facility];  Service: Orthopedics;   Laterality: Right;   Family History  Adopted: Yes  Problem Relation Age of Onset   Colon cancer Maternal Aunt    Esophageal cancer Neg Hx    Rectal cancer Neg Hx    Stomach cancer Neg Hx     Social History   Tobacco Use   Smoking status: Former    Packs/day: 0.00    Years: 48.00    Total pack years: 0.00    Types: Cigarettes    Quit date: 08/18/2018    Years since quitting: 4.0   Smokeless tobacco: Never  Substance Use Topics   Alcohol use: Never    Alcohol/week: 0.0 standard drinks of alcohol   Marital Status: Married  ROS  Review of Systems  Cardiovascular:  Positive for dyspnea on exertion. Negative for chest pain and leg swelling.   Objective      09/04/2022   10:03 AM 09/04/2022    9:59 AM 08/26/2022    9:43 AM  Vitals with BMI  Height  _0  _1   Weight  183 lbs 180 lbs  BMI  57.84 69.62  Systolic 952 841   Diastolic 81 73   Pulse 76 83    Today's Vitals   09/04/22 0959 09/04/22 1003  BP: (!) 167/73 (!) 145/81  Pulse: 83 76  Resp: 16   Temp: (!) 96 F (35.6 C)   TempSrc: Temporal   SpO2: 96%   Weight: 183 lb (  83 kg)   Height: _0  (1.651 m)    Body mass index is 30.45 kg/m.  Physical Exam Neck:     Vascular: No carotid bruit or JVD.  Cardiovascular:     Rate and Rhythm: Normal rate and regular rhythm.     Pulses: Intact distal pulses.     Heart sounds: Normal heart sounds. No murmur heard.    No gallop.  Pulmonary:     Effort: Pulmonary effort is normal.     Breath sounds: Normal breath sounds.  Abdominal:     General: Bowel sounds are normal.     Palpations: Abdomen is soft.  Musculoskeletal:     Right lower leg: No edema.     Left lower leg: No edema.    Medications and allergies  No Known Allergies   Medication list after today's encounter   Current Outpatient Medications:    acetaminophen (TYLENOL) 650 MG CR tablet, Take 1,300 mg by mouth daily as needed for pain., Disp: , Rfl:    atorvastatin (LIPITOR) 40 MG tablet,  Take 40 mg by mouth daily., Disp: , Rfl:    b complex vitamins capsule, Take 1 capsule by mouth daily., Disp: , Rfl:    chlorthalidone (HYGROTON) 25 MG tablet, Take 1 tablet (25 mg total) by mouth every morning., Disp: 30 tablet, Rfl: 2   Cholecalciferol (VITAMIN D-3) 5000 units TABS, Take 5,000 Units by mouth every Monday, Wednesday, and Friday. , Disp: , Rfl:    fexofenadine-pseudoephedrine (ALLEGRA-D) 60-120 MG 12 hr tablet, Take by mouth as needed., Disp: , Rfl:    fluticasone (FLONASE) 50 MCG/ACT nasal spray, , Disp: , Rfl:    gabapentin (NEURONTIN) 300 MG capsule, Take 300 mg by mouth daily., Disp: , Rfl:    glimepiride (AMARYL) 1 MG tablet, Take 0.5 mg by mouth daily., Disp: , Rfl:    Homeopathic Products ([place]), Apply 1 application topically daily as needed (cramps)., Disp: , Rfl:    lisinopril (ZESTRIL) 20 MG tablet, Take 1 tablet by mouth daily., Disp: , Rfl:    metFORMIN (GLUCOPHAGE) 500 MG tablet, Take 250 mg by mouth 2 (two) times daily., Disp: , Rfl:    metoprolol succinate (TOPROL-XL) 50 MG 24 hr tablet, Take 50 mg by mouth daily., Disp: , Rfl:    Multiple Vitamins-Minerals (CENTRUM ADULT PO), Take by mouth., Disp: , Rfl:    OVER THE COUNTER MEDICATION, Apply 1 application topically daily as needed (pain). Pain relief cream with lidocaine, Disp: , Rfl:    potassium chloride (KLOR-CON) 10 MEQ tablet, Take 1 tablet (10 mEq total) by mouth every morning., Disp: 30 tablet, Rfl: 2   amLODipine (NORVASC) 10 MG tablet, Take 1 tablet (10 mg total) by mouth daily., Disp: 90 tablet, Rfl: 3  Laboratory examination:   External labs:   Labs 06/07/2022:  TSH normal at 0.98.  A1c 6.4%.  Serum glucose 89 mg, BUN 18, creatinine 1.33, EGFR 41 mL.  Potassium 3.8.  LFTs normal.  Hb 12.0/HCT 36.7, platelets 221.  Urine analysis negative for protein.  Radiology:    Cardiac Studies:   ABI 04/06/2018: Left ABI 1.10 Right ABI 1.11.  Triphasic waveform pattern  bilateral.  PCV ECHOCARDIOGRAM COMPLETE 08/21/2022  Narrative Echocardiogram 08/21/2022: Left ventricle cavity is normal in size and wall thickness. Normal global wall motion. Normal LV systolic function with EF 61%. Doppler evidence of grade I (impaired) diastolic dysfunction, normal LAP. Mild aortic valve leaflet calcification. No significant stenosis or regurgitation. Mild to  moderate mitral regurgitation. Mild to moderate tricuspid regurgitation. Estimated pulmonary artery systolic pressure 30 mmHg.     EKG:   EKG 07/04/2022: Normal sinus rhythm at rate of 85 bpm, biatrial enlargement, normal axis.  No evidence of ischemia.  No significant change from PCP EKG 06/07/2022.  Compared to 04/17/2021, previously EKG was normal.  Assessment     ICD-10-CM   1. Primary hypertension  I10 amLODipine (NORVASC) 10 MG tablet    potassium chloride (KLOR-CON) 10 MEQ tablet    2. Pure hypercholesterolemia  E78.00     3. Nonspecific abnormal electrocardiogram (ECG) (EKG)  R94.31     4. Dyspnea on exertion  R06.09 PCV MYOCARDIAL PERFUSION WITH LEXISCAN    5. Stage 3b chronic kidney disease ([place])  N18.32       Orders Placed This Encounter  Procedures   PCV MYOCARDIAL PERFUSION WITH LEXISCAN    Standing Status:   Future    Standing Expiration Date:   11/04/2022   Meds ordered this encounter  Medications   amLODipine (NORVASC) 10 MG tablet    Sig: Take 1 tablet (10 mg total) by mouth daily.    Dispense:  90 tablet    Refill:  3   chlorthalidone (HYGROTON) 25 MG tablet    Sig: Take 1 tablet (25 mg total) by mouth every morning.    Dispense:  30 tablet    Refill:  2   potassium chloride (KLOR-CON) 10 MEQ tablet    Sig: Take 1 tablet (10 mEq total) by mouth every morning.    Dispense:  30 tablet    Refill:  2   Medications Discontinued During This Encounter  Medication Reason   amLODipine (NORVASC) 5 MG tablet Reorder     Recommendations:   Felicia Shelton is a 81 y.o. African-American  female patient who is fairly active, underwent left knee replacement last year, has still right knee arthropathy, hypertension which is well controlled, diabetes mellitus type 2 with stage IIIa-b chronic kidney disease, hyperlipidemia on therapy, presents for follow-up of difficult to control hypertension, abnormal EKG.  EKG revealed biatrial enlargement, echocardiogram reviewed, images personally reviewed as well, there is no biatrial enlargement.  There is mild MR and TR.  Suspect hypertensive heart disease.  Blood pressure still uncontrolled.  I will place her in [facility] for hypertension management.  She also has stage IIIb chronic kidney disease.  I will try chlorthalidone for control of hypertension, will closely monitor her renal function. She will obtain BMP in 10 days, I will also request BNP to be added to her labs, labs are being done by  [employer], PA-C.  I will add potassium 10 mEq daily as well as her potassium has been fairly low previously.  I will also increase amlodipine to 10 mg daily from 5 mg.  Car[facility] Clamp would be a best option for her in view of diabetes mellitus and also hypertension with stage IIIb chronic kidney disease.  Cost is an issue, I advised her that I could try patient assistance.  She would like to talk to her insurance agents and get back to me regarding this.  She is also contemplating changing diabetes medicines to either [place] or Jardiance as well, I encouraged her to this in view of cardiovascular protection.  She now admits to having dyspnea on exertion.  In view of high risk features, I have recommended screening her for coronary artery disease by Lexiscan nuclear stress test as she is unable to walk the treadmill  due to arthritis.  I would like to see her back in 6 weeks for close monitoring.  Will obtain labs including lipid profile testing on her next office visit.     Adrian Prows, MD, [facility] 09/04/2022, 9:46 PM Office: [phone]

## 2022-09-09 DIAGNOSIS — R0609 Other forms of dyspnea: Secondary | ICD-10-CM | POA: Diagnosis not present

## 2022-09-09 DIAGNOSIS — E11649 Type 2 diabetes mellitus with hypoglycemia without coma: Secondary | ICD-10-CM | POA: Diagnosis not present

## 2022-09-10 ENCOUNTER — Telehealth: Payer: Self-pay | Admitting: Internal Medicine

## 2022-09-10 DIAGNOSIS — Z86010 Personal history of colonic polyps: Secondary | ICD-10-CM

## 2022-09-10 MED ORDER — NA SULFATE-K SULFATE-MG SULF 17.5-3.13-1.6 GM/177ML PO SOLN: 1.0000 | Freq: Once | ORAL | 0 refills | Status: AC

## 2022-09-10 NOTE — Telephone Encounter (Signed)
Returned call and question answered

## 2022-09-10 NOTE — Telephone Encounter (Signed)
Patient called requesting to speak with a nurse because she can not find the Gatorade 64 oz

## 2022-09-10 NOTE — Telephone Encounter (Signed)
Patient called states she would like to speak with you again. Requesting a call back. Please call to advise.

## 2022-09-10 NOTE — Telephone Encounter (Signed)
Pt is unable to get the 64 oz or equivalent of Gatorade.  Changed to Suprep. New instructions sent to pt and told to use Goodrx for Coupon.  Suprep sent to [place]

## 2022-09-17 ENCOUNTER — Encounter: Payer: Self-pay | Admitting: Internal Medicine

## 2022-09-26 ENCOUNTER — Ambulatory Visit (AMBULATORY_SURGERY_CENTER): Payer: Medicare Other | Admitting: Internal Medicine

## 2022-09-26 ENCOUNTER — Encounter: Payer: Self-pay | Admitting: Internal Medicine

## 2022-09-26 VITALS — BP 148/65 | HR 75 | Temp 96.0°F | Resp 12 | Ht 65.0 in | Wt 180.0 lb

## 2022-09-26 DIAGNOSIS — Z09 Encounter for follow-up examination after completed treatment for conditions other than malignant neoplasm: Secondary | ICD-10-CM

## 2022-09-26 DIAGNOSIS — D124 Benign neoplasm of descending colon: Secondary | ICD-10-CM | POA: Diagnosis not present

## 2022-09-26 DIAGNOSIS — R195 Other fecal abnormalities: Secondary | ICD-10-CM | POA: Diagnosis not present

## 2022-09-26 DIAGNOSIS — Z86010 Personal history of colonic polyps: Secondary | ICD-10-CM | POA: Diagnosis not present

## 2022-09-26 DIAGNOSIS — D122 Benign neoplasm of ascending colon: Secondary | ICD-10-CM

## 2022-09-26 MED ORDER — SODIUM CHLORIDE 0.9 % IV SOLN: 500.0000 mL | Freq: Once | INTRAVENOUS | Status: DC

## 2022-09-26 NOTE — Progress Notes (Signed)
[facility] History and Physical   Primary Care Physician:  Shanon Rosser, PA-C   Reason for Procedure:   Hx colon polyps and + cologuard  Plan:    colonoscopy     HPI: Felicia Shelton is a 81 y.o. female here for polyp surveillance and also had + cologuard  For 05/2008 15 mm sessile adenoma 2010 no polyps 2013 - 2 8-10 mm cecal adenomas ([facility]) 10/11/2015 - no polyps - 04/18/2020 7 diminutive polyps 4 were adenomas  Past Medical History:  Diagnosis Date   Allergy    Arthritis    back   Diabetes mellitus without complication ([place])    type 2    GERD (gastroesophageal reflux disease)    Hx of adenomatous colonic polyp 05/26/2008   Hyperlipidemia    Hypertension    Neuromuscular disorder ([place])     Past Surgical History:  Procedure Laterality Date   ABDOMINAL HYSTERECTOMY     APPENDECTOMY     BREAST EXCISIONAL BIOPSY Left    BREAST SURGERY     left breast   CATARACT EXTRACTION Right 02/2020   COLONOSCOPY  10/11/2015   polyps 2009   JOINT REPLACEMENT     TONSILLECTOMY     removed age 27-5   TOTAL KNEE ARTHROPLASTY Left 04/17/2021   Procedure: LEFT TOTAL KNEE ARTHROPLASTY;  Surgeon: Melrose Nakayama, MD;  Location: [facility];  Service: Orthopedics;  Laterality: Left;   TRANSFORAMINAL LUMBAR INTERBODY FUSION (TLIF) WITH PEDICLE SCREW FIXATION 2 LEVEL Right 12/31/2018   Procedure: RIGHT - SIDED LUMBAR 2-3,LUMBAR 3-4, LUMBAR 4-5 TRANSFORAMINAL LUMBAR INTERBODY FUSION WITH INSTRUMENTATION AND ALLOGRAFT;  Surgeon: Phylliss Bob, MD;  Location: [place];  Service: Orthopedics;  Laterality: Right;    Prior to Admission medications   Medication Sig Start Date End Date Taking? Authorizing Provider  acetaminophen (TYLENOL) 650 MG CR tablet Take 1,300 mg by mouth daily as needed for pain.   Yes [provider]  amLODipine (NORVASC) 10 MG tablet Take 1 tablet (10 mg total) by mouth daily. 09/04/22  Yes Adrian Prows, MD  atorvastatin (LIPITOR) 40 MG tablet Take 40 mg by mouth  daily. 04/08/22  Yes [provider]  b complex vitamins capsule Take 1 capsule by mouth daily.   Yes [provider]  chlorthalidone (HYGROTON) 25 MG tablet Take 1 tablet (25 mg total) by mouth every morning. 09/04/22 12/03/22 Yes Adrian Prows, MD  Cholecalciferol (VITAMIN D-3) 5000 units TABS Take 5,000 Units by mouth every Monday, Wednesday, and Friday.    Yes [provider]  FARXIGA 10 MG TABS tablet Take 10 mg by mouth daily. 09/19/22  Yes [provider]  fexofenadine-pseudoephedrine (ALLEGRA-D) 60-120 MG 12 hr tablet Take by mouth as needed.   Yes [provider]  fluticasone (FLONASE) 50 MCG/ACT nasal spray    Yes [provider]  gabapentin (NEURONTIN) 300 MG capsule Take 300 mg by mouth daily.   Yes [provider]  Homeopathic Products [facility] RELIEF EX) Apply 1 application topically daily as needed (cramps).   Yes [provider]  lisinopril (ZESTRIL) 20 MG tablet Take 1 tablet by mouth daily. 04/16/22  Yes [provider]  metFORMIN (GLUCOPHAGE) 500 MG tablet Take 250 mg by mouth 2 (two) times daily. 10/22/20  Yes [provider]  metoprolol succinate (TOPROL-XL) 50 MG 24 hr tablet Take 50 mg by mouth daily.   Yes [provider]  Multiple Vitamins-Minerals (CENTRUM ADULT PO) Take by mouth.   Yes [provider]  OVER THE COUNTER MEDICATION Apply 1 application topically daily as needed (pain). Pain relief cream with lidocaine   Yes [provider]  potassium chloride (KLOR-CON) 10 MEQ tablet Take 1 tablet (10 mEq total) by mouth every morning. Patient not taking: Reported on 09/26/2022 09/04/22 12/03/22  Adrian Prows, MD    Current Outpatient Medications  Medication Sig Dispense Refill   acetaminophen (TYLENOL) 650 MG CR tablet Take 1,300 mg by mouth daily as needed for pain.     amLODipine (NORVASC) 10 MG tablet Take 1 tablet (10 mg total) by mouth daily. 90 tablet 3    atorvastatin (LIPITOR) 40 MG tablet Take 40 mg by mouth daily.     b complex vitamins capsule Take 1 capsule by mouth daily.     chlorthalidone (HYGROTON) 25 MG tablet Take 1 tablet (25 mg total) by mouth every morning. 30 tablet 2   Cholecalciferol (VITAMIN D-3) 5000 units TABS Take 5,000 Units by mouth every Monday, Wednesday, and Friday.      FARXIGA 10 MG TABS tablet Take 10 mg by mouth daily.     fexofenadine-pseudoephedrine (ALLEGRA-D) 60-120 MG 12 hr tablet Take by mouth as needed.     fluticasone (FLONASE) 50 MCG/ACT nasal spray      gabapentin (NEURONTIN) 300 MG capsule Take 300 mg by mouth daily.     Homeopathic Products (THERAWORX RELIEF EX) Apply 1 application topically daily as needed (cramps).     lisinopril (ZESTRIL) 20 MG tablet Take 1 tablet by mouth daily.     metFORMIN (GLUCOPHAGE) 500 MG tablet Take 250 mg by mouth 2 (two) times daily.     metoprolol succinate (TOPROL-XL) 50 MG 24 hr tablet Take 50 mg by mouth daily.     Multiple Vitamins-Minerals (CENTRUM ADULT PO) Take by mouth.     OVER THE COUNTER MEDICATION Apply 1 application topically daily as needed (pain). Pain relief cream with lidocaine     potassium chloride (KLOR-CON) 10 MEQ tablet Take 1 tablet (10 mEq total) by mouth every morning. (Patient not taking: Reported on 09/26/2022) 30 tablet 2   Current Facility-Administered Medications  Medication Dose Route Frequency Provider Last Rate Last Admin   0.9 %  sodium chloride infusion  500 mL Intravenous Once Gatha Mayer, MD        Allergies as of 09/26/2022   (No Known Allergies)    Family History  Adopted: Yes  Problem Relation Age of Onset   Colon cancer Maternal Aunt    Esophageal cancer Neg Hx    Rectal cancer Neg Hx    Stomach cancer Neg Hx     Social History   Socioeconomic History   Marital status: Married    Spouse name: Not on file   Number of children: Not on file   Years of education: Not on file   Highest education level: Not on file   Occupational History   Not on file  Tobacco Use   Smoking status: Former    Packs/day: 0.00    Years: 48.00    Total pack years: 0.00    Types: Cigarettes    Quit date: 08/18/2018    Years since quitting: 4.1   Smokeless tobacco: Never  Vaping Use   Vaping Use: Never used  Substance and Sexual Activity   Alcohol use: Never    Alcohol/week: 0.0 standard drinks of alcohol   Drug use: No   Sexual activity: Not on file  Other Topics Concern   Not on file  Social History Narrative   Patient is married   Former smoker never alcohol never drug use   Social Determinants of Radio broadcast assistant Strain: Not on file  Food Insecurity: Not on file  Transportation Needs: Not on file  Physical Activity: Not on file  Stress: Not on file  Social Connections: Not on file  Intimate Partner Violence: Not on file    Review of Systems:  All other review of systems negative except as mentioned in the HPI.  Physical Exam: Vital signs BP (!) 161/77   Pulse 83   Temp (!) 96 F (35.6 C) (Temporal)   Ht '5\' 5"'$  (1.651 m)   Wt 180 lb (81.6 kg)   SpO2 98%   BMI 29.95 kg/m   General:   Alert,  Well-developed, well-nourished, pleasant and cooperative in NAD Lungs:  Clear throughout to auscultation.   Heart:  Regular rate and rhythm; no murmurs, clicks, rubs,  or gallops. Abdomen:  Soft, nontender and nondistended. Normal bowel sounds.   Neuro/Psych:  Alert and cooperative. Normal mood and affect. A and O x 3   '@Lavanya Roa'$  Simonne Maffucci, MD, [facility] [phone] (pager) 09/26/2022 11:21 AM@

## 2022-09-26 NOTE — Op Note (Signed)
[place] Patient Name: Felicia Shelton Procedure Date: 09/26/2022 11:21 AM MRN: [id number] Endoscopist: Gatha Mayer , MD, [id number] Age: 81 Referring MD:  Date of Birth: 16-Jun-1941 Gender: Female Account #: [id number] Procedure:                Colonoscopy Indications:              Positive Cologuard test Medicines:                Monitored Anesthesia Care Procedure:                Pre-Anesthesia Assessment:                           - Prior to the procedure, a History and Physical                            was performed, and patient medications and                            allergies were reviewed. The patient's tolerance of                            previous anesthesia was also reviewed. The risks                            and benefits of the procedure and the sedation                            options and risks were discussed with the patient.                            All questions were answered, and informed consent                            was obtained. Prior Anticoagulants: The patient has                            taken no anticoagulant or antiplatelet agents. ASA                            Grade Assessment: II - A patient with mild systemic                            disease. After reviewing the risks and benefits,                            the patient was deemed in satisfactory condition to                            undergo the procedure.                           After obtaining informed consent, the colonoscope  was passed under direct vision. Throughout the                            procedure, the patient's blood pressure, pulse, and                            oxygen saturations were monitored continuously. The                            PCF-HQ190L Colonoscope was introduced through the                            anus and advanced to the the cecum, identified by                            appendiceal orifice and ileocecal  valve. The                            colonoscopy was performed without difficulty. The                            patient tolerated the procedure well. The quality                            of the bowel preparation was excellent. The                            ileocecal valve, appendiceal orifice, and rectum                            were photographed. Scope In: 11:31:43 AM Scope Out: 11:43:46 AM Scope Withdrawal Time: 0 hours 9 minutes 9 seconds  Total Procedure Duration: 0 hours 12 minutes 3 seconds  Findings:                 The perianal and digital rectal examinations were                            normal.                           Three sessile polyps were found in the descending                            colon and ascending colon. The polyps were                            diminutive in size. These polyps were removed with                            a cold snare. Resection and retrieval were                            complete. Verification of patient identification  for the specimen was done. Estimated blood loss was                            minimal.                           The exam was otherwise without abnormality on                            direct and retroflexion views. Complications:            No immediate complications. Estimated Blood Loss:     Estimated blood loss was minimal. Impression:               - Three diminutive polyps in the descending colon                            and in the ascending colon, removed with a cold                            snare. Resected and retrieved.                           - The examination was otherwise normal on direct                            and retroflexion views.                           - Personal history of colonic polyps. For 05/2008 15                            mm sessile adenoma                           2010 no polyps                           2013 - 2 8-10 mm cecal adenomas  ([facility])                           10/11/2015 - no polyps -                           04/18/2020 7 diminutive polyps 4 were adenomas Recommendation:           - Patient has a contact number available for                            emergencies. The signs and symptoms of potential                            delayed complications were discussed with the                            patient. Return to normal activities tomorrow.  Written discharge instructions were provided to the                            patient.                           - Resume previous diet.                           - Continue present medications.                           - Await pathology results.                           - No repeat colonoscopy due to age. And no stool                            tests, either Gatha Mayer, MD 09/26/2022 11:54:07 AM This report has been signed electronically.

## 2022-09-26 NOTE — Patient Instructions (Addendum)
I found and removed 3 little polyps that look benign.  I will let you know what they were.   At your age I do not recommend any further routine colonoscopy testing and do not do routine stool tests if offered, either.  Enjoy your upcoming birthday!  I appreciate the opportunity to care for you. Gatha Mayer, MD, FACG  YOU HAD AN ENDOSCOPIC PROCEDURE TODAY AT [facility]:   Refer to the procedure report that was given to you for any specific questions about what was found during the examination.  If the procedure report does not answer your questions, please call your gastroenterologist to clarify.  If you requested that your care partner not be given the details of your procedure findings, then the procedure report has been included in a sealed envelope for you to review at your convenience later.  YOU SHOULD EXPECT: Some feelings of bloating in the abdomen. Passage of more gas than usual.  Walking can help get rid of the air that was put into your GI tract during the procedure and reduce the bloating. If you had a lower endoscopy (such as a colonoscopy or flexible sigmoidoscopy) you may notice spotting of blood in your stool or on the toilet paper. If you underwent a bowel prep for your procedure, you may not have a normal bowel movement for a few days.  Please Note:  You might notice some irritation and congestion in your nose or some drainage.  This is from the oxygen used during your procedure.  There is no need for concern and it should clear up in a day or so.  SYMPTOMS TO REPORT IMMEDIATELY:  Following lower endoscopy (colonoscopy or flexible sigmoidoscopy):  Excessive amounts of blood in the stool  Significant tenderness or worsening of abdominal pains  Swelling of the abdomen that is new, acute  Fever of 100F or higher  For urgent or emergent issues, a gastroenterologist can be reached at any hour by calling [phone]. Do not use MyChart messaging for  urgent concerns.    DIET:  We do recommend a small meal at first, but then you may proceed to your regular diet.  Drink plenty of fluids but you should avoid alcoholic beverages for 24 hours.  ACTIVITY:  You should plan to take it easy for the rest of today and you should NOT DRIVE or use heavy machinery until tomorrow (because of the sedation medicines used during the test).    FOLLOW UP: Our staff will call the number listed on your records the next business day following your procedure.  We will call around 7:15- 8:00 am to check on you and address any questions or concerns that you may have regarding the information given to you following your procedure. If we do not reach you, we will leave a message.     If any biopsies were taken you will be contacted by phone or by letter within the next 1-3 weeks.  Please call us at [phone] if you have not heard about the biopsies in 3 weeks.    SIGNATURES/CONFIDENTIALITY: You and/or your care partner have signed paperwork which will be entered into your electronic medical record.  These signatures attest to the fact that that the information above on your After Visit Summary has been reviewed and is understood.  Full responsibility of the confidentiality of this discharge information lies with you and/or your care-partner.

## 2022-09-26 NOTE — Progress Notes (Signed)
To pacu, VSS. Report to [email or web address] 

## 2022-09-26 NOTE — Progress Notes (Signed)
Pt's states no medical or surgical changes since previsit or office visit. 

## 2022-09-26 NOTE — Progress Notes (Signed)
Called to room to assist during endoscopic procedure.  Patient ID and intended procedure confirmed with present staff. Received instructions for my participation in the procedure from the performing physician.  

## 2022-09-27 ENCOUNTER — Telehealth: Payer: Self-pay

## 2022-09-27 NOTE — Telephone Encounter (Signed)
  Follow up Call-     09/26/2022   10:30 AM 04/18/2020    8:17 AM  Call back number  Post procedure Call Back phone  # [phone]  Permission to leave phone message Yes Yes     Patient questions:  Do you have a fever, pain , or abdominal swelling? No. Pain Score  0 *  Have you tolerated food without any problems? Yes.    Have you been able to return to your normal activities? Yes.    Do you have any questions about your discharge instructions: Diet   No. Medications  No. Follow up visit  No.  Do you have questions or concerns about your Care? No.  Actions: * If pain score is 4 or above: No action needed, pain <4.

## 2022-09-30 ENCOUNTER — Ambulatory Visit: Payer: Medicare Other

## 2022-09-30 DIAGNOSIS — R0609 Other forms of dyspnea: Secondary | ICD-10-CM

## 2022-10-01 NOTE — Progress Notes (Signed)
Normal stress

## 2022-10-02 NOTE — Progress Notes (Signed)
Normal stress

## 2022-10-02 NOTE — Progress Notes (Signed)
Called patient to inform her about her stress test. Patient understood

## 2022-10-03 ENCOUNTER — Ambulatory Visit
Admission: RE | Admit: 2022-10-03 | Discharge: 2022-10-03 | Disposition: A | Discharge status: Home / Self Care | Payer: Medicare Other | Source: Ambulatory Visit | Attending: Physician Assistant | Admitting: Physician Assistant

## 2022-10-03 DIAGNOSIS — Z1231 Encounter for screening mammogram for malignant neoplasm of breast: Secondary | ICD-10-CM | POA: Diagnosis not present

## 2022-10-03 NOTE — Progress Notes (Signed)
Called and spoke with patient regarding her stress test results.

## 2022-10-08 ENCOUNTER — Ambulatory Visit (INDEPENDENT_AMBULATORY_CARE_PROVIDER_SITE_OTHER): Payer: Medicare Other | Admitting: Podiatry

## 2022-10-08 ENCOUNTER — Encounter: Payer: Self-pay | Admitting: Podiatry

## 2022-10-08 DIAGNOSIS — E1142 Type 2 diabetes mellitus with diabetic polyneuropathy: Secondary | ICD-10-CM

## 2022-10-08 DIAGNOSIS — B351 Tinea unguium: Secondary | ICD-10-CM | POA: Diagnosis not present

## 2022-10-08 DIAGNOSIS — M79675 Pain in left toe(s): Secondary | ICD-10-CM | POA: Diagnosis not present

## 2022-10-08 DIAGNOSIS — I739 Peripheral vascular disease, unspecified: Secondary | ICD-10-CM | POA: Diagnosis not present

## 2022-10-08 DIAGNOSIS — M79674 Pain in right toe(s): Secondary | ICD-10-CM

## 2022-10-09 ENCOUNTER — Encounter: Payer: Self-pay | Admitting: Internal Medicine

## 2022-10-09 DIAGNOSIS — Z86010 Personal history of colonic polyps: Secondary | ICD-10-CM

## 2022-10-12 NOTE — Progress Notes (Signed)
  Subjective:  Patient ID: Felicia Shelton, female    DOB: 06-Sep-1941,  MRN: [id number]  Felicia Shelton presents to clinic today for at risk foot care with history of diabetic neuropathy and painful elongated mycotic toenails 1-5 bilaterally which are tender when wearing enclosed shoe gear. Pain is relieved with periodic professional debridement.  Chief Complaint  Patient presents with   foot care    Patient is here for diabetic foot care, blood sugar 151 on yesterday, A1c 6.4 primary care doctor is Nicki Reaper Long last seen in August 2023.   New problem(s): None.   PCP is Long, Event organiser, [employer].  No Known Allergies  Review of Systems: Negative except as noted in the HPI.  Objective: No changes noted in today's physical examination.  Felicia Shelton is a pleasant 81 y.o. female in NAD. AAO x 3.  Vascular Examination: CFT <3 seconds b/l LE. Faintly palpable DP pulses b/l LE. Faintly palpable PT pulse(s) b/l LE. Pedal hair sparse. No pain with calf compression b/l. Lower extremity skin temperature gradient within normal limits. No edema noted b/l LE. No cyanosis or clubbing noted b/l LE.  Dermatological Examination: Pedal integument with normal turgor, texture and tone BLE. Toenails 1-5 b/l elongated, discolored, dystrophic, thickened, and crumbly with subungual debris and tenderness to dorsal palpation.   Musculoskeletal Examination: Muscle strength 5/5 to all lower extremity muscle groups bilaterally. HAV with bunion deformity noted b/l LE. Hammertoe deformity noted 2-5 b/l.  Neurological Examination: Pt has subjective symptoms of neuropathy. Protective sensation intact 5/5 intact bilaterally with 10g monofilament b/l.  Assessment/Plan: 1. Pain due to onychomycosis of toenails of both feet   2. PAD (peripheral artery disease) ([place])   3. Diabetic polyneuropathy associated with type 2 diabetes mellitus ([place])     No orders of the defined types were placed in this encounter.   -Patient was evaluated  and treated. All patient's and/or POA's questions/concerns answered on today's visit. -Continue diabetic foot care principles: inspect feet daily, monitor glucose as recommended by PCP and/or Endocrinologist, and follow prescribed diet per PCP, Endocrinologist and/or dietician. -Continue supportive shoe gear daily. -Toenails 1-5 b/l were debrided in length and girth with sterile nail nippers and dremel without iatrogenic bleeding.  -Patient/POA to call should there be question/concern in the interim.   Return in about 3 months (around 01/08/2023).  Marzetta Board, DPM

## 2022-10-16 ENCOUNTER — Encounter: Payer: Self-pay | Admitting: Internal Medicine

## 2022-10-16 ENCOUNTER — Ambulatory Visit: Payer: Medicare Other | Admitting: Internal Medicine

## 2022-10-16 VITALS — BP 132/78 | HR 96 | Ht 65.0 in | Wt 179.4 lb

## 2022-10-16 DIAGNOSIS — E7800 Pure hypercholesterolemia, unspecified: Secondary | ICD-10-CM | POA: Diagnosis not present

## 2022-10-16 DIAGNOSIS — R0609 Other forms of dyspnea: Secondary | ICD-10-CM

## 2022-10-16 DIAGNOSIS — I10 Essential (primary) hypertension: Secondary | ICD-10-CM

## 2022-10-16 NOTE — Progress Notes (Signed)
Primary Physician/Referring:  Shanon Rosser, PA-C  Patient ID: Felicia Shelton, female    DOB: 1941/05/25, 81 y.o.   MRN: [id number]  Chief Complaint  Patient presents with   Follow-up    6 week, test results   Hypertension   Shortness of Breath   HPI:    Felicia Shelton  is a 81 y.o. African-American female patient who is fairly active, underwent left knee replacement last year, has still right knee arthropathy, hypertension which is well controlled, diabetes mellitus type 2 with stage IIIa-b chronic kidney disease, hyperlipidemia on therapy, presents for follow-up of difficult to control hypertension, abnormal EKG.  Patient presents for follow-up visit. Last time her blood pressure meds were adjusted as her pressure was still elevated and today it is very well controlled. Patient is tolerating medication changes well. Her PCP also just switched her to [place]. Denies chest pain, shortness of breath, palpitations, diaphoresis, syncope, edema, PND, orthopnea.   Past Medical History:  Diagnosis Date   Allergy    Arthritis    back   Diabetes mellitus without complication ([place])    type 2    GERD (gastroesophageal reflux disease)    Hx of adenomatous colonic polyp 05/26/2008   Hyperlipidemia    Hypertension    Neuromuscular disorder ([place])    Past Surgical History:  Procedure Laterality Date   ABDOMINAL HYSTERECTOMY     APPENDECTOMY     BREAST EXCISIONAL BIOPSY Left    BREAST SURGERY     left breast   CATARACT EXTRACTION Right 02/2020   COLONOSCOPY  10/11/2015   polyps 2009   JOINT REPLACEMENT     TONSILLECTOMY     removed age [age]   TOTAL KNEE ARTHROPLASTY Left 04/17/2021   Procedure: LEFT TOTAL KNEE ARTHROPLASTY;  Surgeon: Melrose Nakayama, MD;  Location: [facility];  Service: Orthopedics;  Laterality: Left;   TRANSFORAMINAL LUMBAR INTERBODY FUSION (TLIF) WITH PEDICLE SCREW FIXATION 2 LEVEL Right 12/31/2018   Procedure: RIGHT - SIDED LUMBAR 2-3,LUMBAR 3-4, LUMBAR 4-5 TRANSFORAMINAL LUMBAR  INTERBODY FUSION WITH INSTRUMENTATION AND ALLOGRAFT;  Surgeon: Phylliss Bob, MD;  Location: [facility];  Service: Orthopedics;  Laterality: Right;   Family History  Adopted: Yes  Problem Relation Age of Onset   Colon cancer Maternal Aunt    Esophageal cancer Neg Hx    Rectal cancer Neg Hx    Stomach cancer Neg Hx     Social History   Tobacco Use   Smoking status: Former    Packs/day: 0.00    Years: 48.00    Total pack years: 0.00    Types: Cigarettes    Quit date: 08/18/2018    Years since quitting: 4.1   Smokeless tobacco: Never  Substance Use Topics   Alcohol use: Never    Alcohol/week: 0.0 standard drinks of alcohol   Marital Status: Married  ROS  Review of Systems  Cardiovascular:  Negative for chest pain, dyspnea on exertion and leg swelling.   Objective      10/16/2022   11:47 AM 09/26/2022   12:06 PM 09/26/2022   11:56 AM  Vitals with BMI  Height _0     Weight 179 lbs 6 oz    BMI 57.01    Systolic 779 390 300  Diastolic 78 65 57  Pulse 96 75 76   Today's Vitals   10/16/22 1147  BP: 132/78  Pulse: 96  SpO2: 98%  Weight: 179 lb 6.4 oz (81.4 kg)  Height: _1  (1.651 m)  Body mass index is 29.85 kg/m.  Physical Exam Neck:     Vascular: No carotid bruit or JVD.  Cardiovascular:     Rate and Rhythm: Normal rate and regular rhythm.     Pulses: Intact distal pulses.     Heart sounds: Normal heart sounds. No murmur heard.    No gallop.  Pulmonary:     Effort: Pulmonary effort is normal.     Breath sounds: Normal breath sounds.  Abdominal:     General: Bowel sounds are normal.     Palpations: Abdomen is soft.  Musculoskeletal:     Right lower leg: No edema.     Left lower leg: No edema.    Medications and allergies  No Known Allergies   Medication list after today's encounter   Current Outpatient Medications:    acetaminophen (TYLENOL) 650 MG CR tablet, Take 1,300 mg by mouth daily as needed for pain., Disp: , Rfl:    amLODipine (NORVASC)  10 MG tablet, Take 1 tablet (10 mg total) by mouth daily., Disp: 90 tablet, Rfl: 3   atorvastatin (LIPITOR) 40 MG tablet, Take 40 mg by mouth daily., Disp: , Rfl:    b complex vitamins capsule, Take 1 capsule by mouth daily., Disp: , Rfl:    Cholecalciferol (VITAMIN D-3) 5000 units TABS, Take 5,000 Units by mouth every Monday, Wednesday, and Friday. , Disp: , Rfl:    FARXIGA 10 MG TABS tablet, Take 10 mg by mouth daily., Disp: , Rfl:    fexofenadine-pseudoephedrine (ALLEGRA-D) 60-120 MG 12 hr tablet, Take by mouth as needed., Disp: , Rfl:    fluticasone (FLONASE) 50 MCG/ACT nasal spray, , Disp: , Rfl:    gabapentin (NEURONTIN) 300 MG capsule, Take 300 mg by mouth daily., Disp: , Rfl:    Homeopathic Products ([place]), Apply 1 application topically daily as needed (cramps)., Disp: , Rfl:    lisinopril (ZESTRIL) 20 MG tablet, Take 1 tablet by mouth daily., Disp: , Rfl:    metFORMIN (GLUCOPHAGE) 500 MG tablet, Take 250 mg by mouth 2 (two) times daily., Disp: , Rfl:    metoprolol succinate (TOPROL-XL) 50 MG 24 hr tablet, Take 50 mg by mouth daily., Disp: , Rfl:    Multiple Vitamins-Minerals (CENTRUM ADULT PO), Take by mouth., Disp: , Rfl:    OVER THE COUNTER MEDICATION, Apply 1 application topically daily as needed (pain). Pain relief cream with lidocaine, Disp: , Rfl:   Laboratory examination:   External labs:   Labs 06/07/2022:  TSH normal at 0.98.  A1c 6.4%.  Serum glucose 89 mg, BUN 18, creatinine 1.33, EGFR 41 mL.  Potassium 3.8.  LFTs normal.  Hb 12.0/HCT 36.7, platelets 221.  Urine analysis negative for protein.  Radiology:    Cardiac Studies:   ABI 04/06/2018: Left ABI 1.10 Right ABI 1.11.  Triphasic waveform pattern bilateral.  PCV ECHOCARDIOGRAM COMPLETE 08/21/2022  Narrative Echocardiogram 08/21/2022: Left ventricle cavity is normal in size and wall thickness. Normal global wall motion. Normal LV systolic function with EF 61%. Doppler evidence of grade I  (impaired) diastolic dysfunction, normal LAP. Mild aortic valve leaflet calcification. No significant stenosis or regurgitation. Mild to moderate mitral regurgitation. Mild to moderate tricuspid regurgitation. Estimated pulmonary artery systolic pressure 30 mmHg.   Lexiscan (with Mod Bruce protocol) Nuclear stress test 09/30/2022 Myocardial perfusion is normal. Overall LV systolic function is normal without regional wall motion abnormalities. Stress LV EF: 62%. Low risk study. Nondiagnostic ECG stress. The heart rate response was consistent  with Regadenoson. The blood pressure response was physiologic. No previous exam available for comparison.     EKG:   EKG 07/04/2022: Normal sinus rhythm at rate of 85 bpm, biatrial enlargement, normal axis.  No evidence of ischemia.  No significant change from PCP EKG 06/07/2022.  Compared to 04/17/2021, previously EKG was normal.  Assessment     ICD-10-CM   1. Dyspnea on exertion  R06.09     2. Primary hypertension  I10     3. Pure hypercholesterolemia  E78.00        No orders of the defined types were placed in this encounter.  No orders of the defined types were placed in this encounter.  Medications Discontinued During This Encounter  Medication Reason   chlorthalidone (HYGROTON) 25 MG tablet Discontinued by provider   potassium chloride (KLOR-CON) 10 MEQ tablet Discontinued by provider     Recommendations:   Felicia Shelton is a 81 y.o. African-American female patient who is fairly active, underwent left knee replacement last year, has still right knee arthropathy, hypertension which is well controlled, diabetes mellitus type 2 with stage IIIa-b chronic kidney disease, hyperlipidemia on therapy, presents for follow-up of difficult to control hypertension, abnormal EKG.  Dyspnea on exertion Nuclear stress test negative for ischemia Breathing is improved now Patient was started on Farxiga by PCP   Primary hypertension Well controlled at  today's visit since changes made last time Continue current cardiac medications. Encourage low-sodium diet, less than 2000 mg daily.   Pure hypercholesterolemia Continue statin   Follow-up with Dr Einar Gip in 6 months or sooner if needed    Floydene Flock, DO, [facility] 10/16/2022, 11:50 AM Office: [phone]

## 2022-11-04 DIAGNOSIS — M545 Low back pain, unspecified: Secondary | ICD-10-CM | POA: Diagnosis not present

## 2023-01-11 DIAGNOSIS — M545 Low back pain, unspecified: Secondary | ICD-10-CM | POA: Diagnosis not present

## 2023-01-11 DIAGNOSIS — M7061 Trochanteric bursitis, right hip: Secondary | ICD-10-CM | POA: Diagnosis not present

## 2023-01-22 DIAGNOSIS — E11649 Type 2 diabetes mellitus with hypoglycemia without coma: Secondary | ICD-10-CM | POA: Diagnosis not present

## 2023-01-22 DIAGNOSIS — K219 Gastro-esophageal reflux disease without esophagitis: Secondary | ICD-10-CM | POA: Diagnosis not present

## 2023-01-22 DIAGNOSIS — I10 Essential (primary) hypertension: Secondary | ICD-10-CM | POA: Diagnosis not present

## 2023-01-22 DIAGNOSIS — N189 Chronic kidney disease, unspecified: Secondary | ICD-10-CM | POA: Diagnosis not present

## 2023-01-23 LAB — LAB REPORT - SCANNED
A1c: 6.9
EGFR: 43

## 2023-01-28 ENCOUNTER — Ambulatory Visit (INDEPENDENT_AMBULATORY_CARE_PROVIDER_SITE_OTHER): Payer: Medicare Other | Admitting: Podiatry

## 2023-01-28 ENCOUNTER — Encounter: Payer: Self-pay | Admitting: Podiatry

## 2023-01-28 DIAGNOSIS — E1142 Type 2 diabetes mellitus with diabetic polyneuropathy: Secondary | ICD-10-CM | POA: Diagnosis not present

## 2023-01-28 DIAGNOSIS — M2041 Other hammer toe(s) (acquired), right foot: Secondary | ICD-10-CM

## 2023-01-28 DIAGNOSIS — M2011 Hallux valgus (acquired), right foot: Secondary | ICD-10-CM

## 2023-01-28 DIAGNOSIS — B351 Tinea unguium: Secondary | ICD-10-CM

## 2023-01-28 DIAGNOSIS — I739 Peripheral vascular disease, unspecified: Secondary | ICD-10-CM | POA: Diagnosis not present

## 2023-01-28 DIAGNOSIS — E119 Type 2 diabetes mellitus without complications: Secondary | ICD-10-CM

## 2023-01-28 DIAGNOSIS — L720 Epidermal cyst: Secondary | ICD-10-CM | POA: Insufficient documentation

## 2023-01-28 DIAGNOSIS — M2012 Hallux valgus (acquired), left foot: Secondary | ICD-10-CM

## 2023-01-28 DIAGNOSIS — M2042 Other hammer toe(s) (acquired), left foot: Secondary | ICD-10-CM

## 2023-02-01 NOTE — Progress Notes (Signed)
ANNUAL DIABETIC FOOT EXAM  Subjective: Felicia Shelton presents today for annual diabetic foot examination.  Chief Complaint  Patient presents with   Nail Problem    DFC BS-did not check A1C-6.? PCP-Scott Long PCP [facility]-"last week"    Patient confirms h/o diabetes.  Patient relates 10 year h/o diabetes.  Patient denies any h/o foot wounds.  Patient denies any numbness, tingling, burning, or pins/needle sensation in feet.  Risk factors: diabetes, HTN, hyperlipidemia, h/o tobacco use in remission.  Long, Scott, PA-C is patient's PCP.  Past Medical History:  Diagnosis Date   Allergy    Arthritis    back   Diabetes mellitus without complication ([place])    type 2    GERD (gastroesophageal reflux disease)    Hx of adenomatous colonic polyp 05/26/2008   Hyperlipidemia    Hypertension    Neuromuscular disorder [facility])    Patient Active Problem List   Diagnosis Date Noted   [place] 01/28/2023   Chronic kidney disease 08/25/2022   Vasomotor rhinitis 04/23/2022   Gastroesophageal reflux disease 02/21/2022   Diabetes mellitus ([place]) 05/26/2021   Primary osteoarthritis of left knee 04/17/2021   Primary localized osteoarthritis of left knee 04/17/2021   Radiculopathy 12/31/2018   Hypertension 08/04/2012   Hx of adenomatous colonic polyp 05/26/2008   Past Surgical History:  Procedure Laterality Date   ABDOMINAL HYSTERECTOMY     APPENDECTOMY     BREAST EXCISIONAL BIOPSY Left    BREAST SURGERY     left breast   CATARACT EXTRACTION Right 02/2020   COLONOSCOPY  10/11/2015   polyps 2009   JOINT REPLACEMENT     TONSILLECTOMY     removed age [age]   TOTAL KNEE ARTHROPLASTY Left 04/17/2021   Procedure: LEFT TOTAL KNEE ARTHROPLASTY;  Surgeon: Melrose Nakayama, MD;  [facility];  Service: Orthopedics;  Laterality: Left;   TRANSFORAMINAL LUMBAR INTERBODY FUSION (TLIF) WITH PEDICLE SCREW FIXATION 2 LEVEL Right 12/31/2018   Procedure: RIGHT - SIDED LUMBAR 2-3,LUMBAR 3-4, LUMBAR 4-5  TRANSFORAMINAL LUMBAR INTERBODY FUSION WITH INSTRUMENTATION AND ALLOGRAFT;  Surgeon: Phylliss Bob, MD;  Location: [facility];  Service: Orthopedics;  Laterality: Right;   Current Outpatient Medications on File Prior to Visit  Medication Sig Dispense Refill   acetaminophen (TYLENOL) 650 MG CR tablet Take 1,300 mg by mouth daily as needed for pain.     amLODipine (NORVASC) 10 MG tablet Take 1 tablet (10 mg total) by mouth daily. 90 tablet 3   atorvastatin (LIPITOR) 40 MG tablet Take 40 mg by mouth daily.     b complex vitamins capsule Take 1 capsule by mouth daily.     Cholecalciferol (VITAMIN D-3) 5000 units TABS Take 5,000 Units by mouth every Monday, Wednesday, and Friday.      FARXIGA 10 MG TABS tablet Take 10 mg by mouth daily.     fexofenadine-pseudoephedrine (ALLEGRA-D) 60-120 MG 12 hr tablet Take by mouth as needed.     fluticasone (FLONASE) 50 MCG/ACT nasal spray      gabapentin (NEURONTIN) 300 MG capsule Take 300 mg by mouth daily.     Homeopathic Products (THERAWORX RELIEF EX) Apply 1 application topically daily as needed (cramps).     lisinopril (ZESTRIL) 20 MG tablet Take 1 tablet by mouth daily.     metFORMIN (GLUCOPHAGE) 500 MG tablet Take 250 mg by mouth 2 (two) times daily.     metoprolol succinate (TOPROL-XL) 50 MG 24 hr tablet Take 50 mg by mouth daily.  Multiple Vitamins-Minerals (CENTRUM ADULT PO) Take by mouth.     OVER THE COUNTER MEDICATION Apply 1 application topically daily as needed (pain). Pain relief cream with lidocaine     No current facility-administered medications on file prior to visit.    No Known Allergies Social History   Occupational History   Not on file  Tobacco Use   Smoking status: Former    Packs/day: 0.00    Years: 48.00    Additional pack years: 0.00    Total pack years: 0.00    Types: Cigarettes    Quit date: 08/18/2018    Years since quitting: 4.4   Smokeless tobacco: Never  Vaping Use   Vaping Use: Never used  Substance and Sexual  Activity   Alcohol use: Never    Alcohol/week: 0.0 standard drinks of alcohol   Drug use: No   Sexual activity: Not on file   Family History  Adopted: Yes  Problem Relation Age of Onset   Colon cancer Maternal Aunt    Esophageal cancer Neg Hx    Rectal cancer Neg Hx    Stomach cancer Neg Hx    Immunization History  Administered Date(s) Administered   Influenza, High Dose Seasonal PF 08/25/2014, 08/31/2015, 07/23/2017   Pneumococcal Conjugate-13 08/25/2014   Td 08/03/2012     Review of Systems: Negative except as noted in the HPI.   Objective: There were no vitals filed for this visit.  Felicia Shelton is a pleasant 82 y.o. female in NAD. AAO X 3.  Vascular Examination: CFT <3 seconds b/l. DP/PT pulses faintly palpable b/l. Skin temperature gradient warm to warm b/l. No pain with calf compression. No ischemia or gangrene. No cyanosis or clubbing noted b/l. Pedal hair sparse.   Neurological Examination: Sensation grossly intact b/l with 10 gram monofilament. Vibratory sensation intact b/l. Pt has subjective symptoms of neuropathy.  Dermatological Examination: Pedal skin warm and supple b/l.   No open wounds. No interdigital macerations.  Toenails 1-5 b/l thick, discolored, elongated with subungual debris and pain on dorsal palpation.    Pedal skin is warm and supple b/l LE. No open wounds b/l LE. No interdigital macerations noted b/l LE. Toenails 1-4 bilaterally elongated, discolored, dystrophic, thickened, and crumbly with subungual debris and tenderness to dorsal palpation. Anonychia noted bilateral 5th toes. Nailbed(s) epithelialized.   Musculoskeletal Examination: Muscle strength 5/5 to b/l LE. HAV with bunion bilaterally and hammertoes 2-5 b/l.  Radiographs: None  Footwear Assessment: Does the patient wear appropriate shoes? Yes. Does the patient need inserts/orthotics? No.  Lab Results  Component Value Date   HGBA1C 6.6 (H) 12/29/2018   ADA Risk  Categorization: Low Risk :  Patient has all of the following: Intact protective sensation No prior foot ulcer  No severe deformity Pedal pulses present  Assessment: 1. Pain due to onychomycosis of toenails of both feet   2. Acquired hammertoes of both feet   3. Hallux valgus, acquired, bilateral   4. PAD (peripheral artery disease) ([place])   5. Diabetic polyneuropathy associated with type 2 diabetes mellitus ([place])   6. Encounter for diabetic foot exam [facility])     Plan: -Patient was evaluated and treated. All patient's and/or POA's questions/concerns answered on today's visit. -Diabetic foot examination performed today. -Stressed the importance of good glycemic control and the detriment of not  controlling glucose levels in relation to the foot. -Patient to continue soft, supportive shoe gear daily. -Toenails were debrided in length and girth 1-4 bilaterally with sterile  nail nippers and dremel without iatrogenic bleeding.  -Patient/POA to call should there be question/concern in the interim. Return in about 3 months (around 04/30/2023).  Marzetta Board, DPM

## 2023-02-21 DIAGNOSIS — M1711 Unilateral primary osteoarthritis, right knee: Secondary | ICD-10-CM | POA: Diagnosis not present

## 2023-02-24 DIAGNOSIS — N1832 Chronic kidney disease, stage 3b: Secondary | ICD-10-CM | POA: Diagnosis not present

## 2023-02-24 DIAGNOSIS — I129 Hypertensive chronic kidney disease with stage 1 through stage 4 chronic kidney disease, or unspecified chronic kidney disease: Secondary | ICD-10-CM | POA: Diagnosis not present

## 2023-02-24 DIAGNOSIS — N281 Cyst of kidney, acquired: Secondary | ICD-10-CM | POA: Diagnosis not present

## 2023-02-24 DIAGNOSIS — E1122 Type 2 diabetes mellitus with diabetic chronic kidney disease: Secondary | ICD-10-CM | POA: Diagnosis not present

## 2023-02-28 DIAGNOSIS — M1711 Unilateral primary osteoarthritis, right knee: Secondary | ICD-10-CM | POA: Diagnosis not present

## 2023-03-04 DIAGNOSIS — L723 Sebaceous cyst: Secondary | ICD-10-CM | POA: Diagnosis not present

## 2023-03-12 DIAGNOSIS — M1711 Unilateral primary osteoarthritis, right knee: Secondary | ICD-10-CM | POA: Diagnosis not present

## 2023-03-18 DIAGNOSIS — E1165 Type 2 diabetes mellitus with hyperglycemia: Secondary | ICD-10-CM | POA: Diagnosis not present

## 2023-03-18 DIAGNOSIS — I10 Essential (primary) hypertension: Secondary | ICD-10-CM | POA: Diagnosis not present

## 2023-03-18 DIAGNOSIS — E559 Vitamin D deficiency, unspecified: Secondary | ICD-10-CM | POA: Diagnosis not present

## 2023-03-18 DIAGNOSIS — N189 Chronic kidney disease, unspecified: Secondary | ICD-10-CM | POA: Diagnosis not present

## 2023-04-16 ENCOUNTER — Ambulatory Visit: Payer: Self-pay | Admitting: Internal Medicine

## 2023-04-16 DIAGNOSIS — L720 Epidermal cyst: Secondary | ICD-10-CM | POA: Diagnosis not present

## 2023-04-16 DIAGNOSIS — L728 Other follicular cysts of the skin and subcutaneous tissue: Secondary | ICD-10-CM | POA: Diagnosis not present

## 2023-04-16 DIAGNOSIS — L723 Sebaceous cyst: Secondary | ICD-10-CM | POA: Diagnosis not present

## 2023-04-16 NOTE — Progress Notes (Signed)
No show

## 2023-04-18 DIAGNOSIS — H524 Presbyopia: Secondary | ICD-10-CM | POA: Diagnosis not present

## 2023-04-30 DIAGNOSIS — I10 Essential (primary) hypertension: Secondary | ICD-10-CM | POA: Diagnosis not present

## 2023-04-30 DIAGNOSIS — N189 Chronic kidney disease, unspecified: Secondary | ICD-10-CM | POA: Diagnosis not present

## 2023-04-30 DIAGNOSIS — E11649 Type 2 diabetes mellitus with hypoglycemia without coma: Secondary | ICD-10-CM | POA: Diagnosis not present

## 2023-04-30 DIAGNOSIS — M179 Osteoarthritis of knee, unspecified: Secondary | ICD-10-CM | POA: Diagnosis not present

## 2023-05-20 DIAGNOSIS — E785 Hyperlipidemia, unspecified: Secondary | ICD-10-CM | POA: Diagnosis not present

## 2023-05-20 DIAGNOSIS — E1140 Type 2 diabetes mellitus with diabetic neuropathy, unspecified: Secondary | ICD-10-CM | POA: Diagnosis not present

## 2023-05-20 DIAGNOSIS — D519 Vitamin B12 deficiency anemia, unspecified: Secondary | ICD-10-CM | POA: Diagnosis not present

## 2023-06-03 ENCOUNTER — Ambulatory Visit (INDEPENDENT_AMBULATORY_CARE_PROVIDER_SITE_OTHER): Payer: Medicare Other | Admitting: Podiatry

## 2023-06-03 ENCOUNTER — Encounter: Payer: Self-pay | Admitting: Podiatry

## 2023-06-03 VITALS — BP 169/71

## 2023-06-03 DIAGNOSIS — I739 Peripheral vascular disease, unspecified: Secondary | ICD-10-CM | POA: Diagnosis not present

## 2023-06-03 DIAGNOSIS — B351 Tinea unguium: Secondary | ICD-10-CM | POA: Diagnosis not present

## 2023-06-03 DIAGNOSIS — E1142 Type 2 diabetes mellitus with diabetic polyneuropathy: Secondary | ICD-10-CM

## 2023-06-03 DIAGNOSIS — M79675 Pain in left toe(s): Secondary | ICD-10-CM | POA: Diagnosis not present

## 2023-06-03 DIAGNOSIS — M79674 Pain in right toe(s): Secondary | ICD-10-CM

## 2023-06-09 NOTE — Progress Notes (Signed)
  Subjective:  Patient ID: Felicia Shelton, female    DOB: Feb 20, 1941,  MRN: [id number]  Felicia Shelton presents to clinic today for at risk foot care. Patient has h/o PAD and neuropathy and painful thick toenails that are difficult to trim. Pain interferes with ambulation. Aggravating factors include wearing enclosed shoe gear. Pain is relieved with periodic professional debridement.  Chief Complaint  Patient presents with   Nail Problem    Dfc, pt checked her blood sugar this morning and it was 127   New problem(s): None.   PCP is Long, Acupuncturist, [employer].  No Known Allergies  Review of Systems: Negative except as noted in the HPI.  Objective: No changes noted in today's physical examination. Vitals:   06/03/23 1558  BP: (!) 169/71   Felicia Shelton is a pleasant 82 y.o. female in NAD. AAO x 3.  Vascular Examination: CFT <3 seconds b/l. DP/PT pulses faintly palpable b/l. Digital hair decreased.  Skin temperature gradient warm to warm b/l. No pain with calf compression. No ischemia or gangrene. No cyanosis or clubbing noted b/l.    Neurological Examination: Sensation grossly intact b/l with 10 gram monofilament. Vibratory sensation intact b/l. Pt has subjective symptoms of neuropathy.  Dermatological Examination: Pedal skin warm and supple b/l.   No open wounds. No interdigital macerations.  Toenails 1-4 b/l thick, discolored, elongated with subungual debris and pain on dorsal palpation. Anonychia noted L 5th toe and R 5th toe. Nailbed(s) epithelialized.   Musculoskeletal Examination: Muscle strength 5/5 to b/l LE. HAV with bunion deformity noted b/l LE. Hammertoe deformity noted 2-5 b/l.  Radiographs: None  Assessment/Plan: 1. Pain due to onychomycosis of toenails of both feet   2. PAD (peripheral artery disease) (HCC)   3. Diabetic polyneuropathy associated with type 2 diabetes mellitus (HCC)     -Consent given for treatment as described below: -Examined patient. -Continue foot and  shoe inspections daily. Monitor blood glucose per PCP/Endocrinologist's recommendations. -Continue supportive shoe gear daily. -Mycotic toenails 1-5 bilaterally were debrided in length and girth with sterile nail nippers and dremel without incident. -Patient/POA to call should there be question/concern in the interim.   Return in about 3 months (around 09/03/2023).  Felicia Shelton, DPM

## 2023-07-14 DIAGNOSIS — E785 Hyperlipidemia, unspecified: Secondary | ICD-10-CM | POA: Diagnosis not present

## 2023-08-12 DIAGNOSIS — L728 Other follicular cysts of the skin and subcutaneous tissue: Secondary | ICD-10-CM | POA: Diagnosis not present

## 2023-08-12 DIAGNOSIS — L723 Sebaceous cyst: Secondary | ICD-10-CM | POA: Diagnosis not present

## 2023-08-12 DIAGNOSIS — L720 Epidermal cyst: Secondary | ICD-10-CM | POA: Diagnosis not present

## 2023-08-29 ENCOUNTER — Other Ambulatory Visit: Payer: Self-pay | Admitting: Physician Assistant

## 2023-08-29 DIAGNOSIS — Z1231 Encounter for screening mammogram for malignant neoplasm of breast: Secondary | ICD-10-CM

## 2023-09-03 DIAGNOSIS — Z23 Encounter for immunization: Secondary | ICD-10-CM | POA: Diagnosis not present

## 2023-09-09 ENCOUNTER — Ambulatory Visit (INDEPENDENT_AMBULATORY_CARE_PROVIDER_SITE_OTHER): Payer: Medicare Other | Admitting: Podiatry

## 2023-09-09 ENCOUNTER — Encounter: Payer: Self-pay | Admitting: Podiatry

## 2023-09-09 VITALS — Ht 65.0 in | Wt 179.4 lb

## 2023-09-09 DIAGNOSIS — E1142 Type 2 diabetes mellitus with diabetic polyneuropathy: Secondary | ICD-10-CM

## 2023-09-09 DIAGNOSIS — I739 Peripheral vascular disease, unspecified: Secondary | ICD-10-CM

## 2023-09-09 DIAGNOSIS — M79674 Pain in right toe(s): Secondary | ICD-10-CM

## 2023-09-09 DIAGNOSIS — B351 Tinea unguium: Secondary | ICD-10-CM | POA: Diagnosis not present

## 2023-09-09 DIAGNOSIS — M79675 Pain in left toe(s): Secondary | ICD-10-CM

## 2023-09-14 NOTE — Progress Notes (Signed)
Subjective:  Patient ID: Felicia Shelton, female    DOB: 03/27/1941,  MRN: [id number]  82 y.o. female presents with at risk footcare. Patient has h/o diabetes, neuropathy and PAD and is seen for  and painful thick toenails that are difficult to trim. Pain interferes with ambulation. Aggravating factors include wearing enclosed shoe gear. Pain is relieved with periodic professional debridement. Chief Complaint  Patient presents with   Diabetes    Pt is here for dfc, Last A1C was 6.6, last office visit was in August     PCP: Lindaann Pascal, PA-C.  New problem(s): None.   Review of Systems: Negative except as noted in the HPI.   No Known Allergies  Objective:  There were no vitals filed for this visit. Constitutional Patient is a pleasant 81 y.o. African American female WD, WN in NAD. AAO x 3.  Vascular Capillary fill time to digits <3 seconds.  DP/PT pulse(s) are faintly palpable b/l lower extremities. Pedal hair absent b/l. Lower extremity skin temperature gradient warm to cool b/l. No pain with calf compression b/l. No cyanosis or clubbing noted. No ischemia nor gangrene noted b/l.   Neurologic Protective sensation intact 5/5 intact bilaterally with 10g monofilament b/l. Vibratory sensation intact b/l. No clonus b/l. Pt has subjective symptoms of neuropathy.  Dermatologic Pedal skin is thin, shiny and atrophic b/l.  No open wounds b/l lower extremities. No interdigital macerations b/l lower extremities. Toenails 1-4 b/l elongated, discolored, dystrophic, thickened, crumbly with subungual debris and tenderness to dorsal palpation.   Orthopedic: Normal muscle strength 5/5 to all lower extremity muscle groups bilaterally. HAV with bunion deformity noted b/l LE. Hammertoe deformity noted 2-5 b/l.   Last HgA1c:      No data to display           Assessment:   1. Pain due to onychomycosis of toenails of both feet   2. PAD (peripheral artery disease) (HCC)   3. Diabetic polyneuropathy  associated with type 2 diabetes mellitus (HCC)    Plan:  Patient was evaluated and treated and all questions answered. Consent given for treatment as described below: -Consent given for treatment as described below: -Examined patient. -Continue supportive shoe gear daily. -Mycotic toenails 1-4 bilaterally were debrided in length and girth with sterile nail nippers and dremel without iatrogenic bleeding. -Patient/POA to call should there be question/concern in the interim.  Return in about 3 months (around 12/10/2023).  Freddie Breech, DPM

## 2023-09-17 DIAGNOSIS — I129 Hypertensive chronic kidney disease with stage 1 through stage 4 chronic kidney disease, or unspecified chronic kidney disease: Secondary | ICD-10-CM | POA: Diagnosis not present

## 2023-09-17 DIAGNOSIS — N1832 Chronic kidney disease, stage 3b: Secondary | ICD-10-CM | POA: Diagnosis not present

## 2023-09-17 DIAGNOSIS — N281 Cyst of kidney, acquired: Secondary | ICD-10-CM | POA: Diagnosis not present

## 2023-09-17 DIAGNOSIS — E1122 Type 2 diabetes mellitus with diabetic chronic kidney disease: Secondary | ICD-10-CM | POA: Diagnosis not present

## 2023-10-07 ENCOUNTER — Ambulatory Visit
Admission: RE | Admit: 2023-10-07 | Discharge: 2023-10-07 | Disposition: A | Discharge status: Home / Self Care | Payer: Medicare Other | Source: Ambulatory Visit | Attending: Physician Assistant | Admitting: Physician Assistant

## 2023-10-07 DIAGNOSIS — Z1231 Encounter for screening mammogram for malignant neoplasm of breast: Secondary | ICD-10-CM

## 2023-10-12 ENCOUNTER — Other Ambulatory Visit: Payer: Self-pay | Admitting: Cardiology

## 2023-10-12 DIAGNOSIS — I10 Essential (primary) hypertension: Secondary | ICD-10-CM

## 2023-10-13 ENCOUNTER — Telehealth: Payer: Self-pay | Admitting: Cardiology

## 2023-10-13 ENCOUNTER — Other Ambulatory Visit: Payer: Self-pay | Admitting: Cardiology

## 2023-10-13 DIAGNOSIS — I10 Essential (primary) hypertension: Secondary | ICD-10-CM

## 2023-10-13 NOTE — Telephone Encounter (Signed)
*  STAT* If patient is at the pharmacy, call can be transferred to refill team.   1. Which medications need to be refilled? (please list name of each medication and dose if known)   amLODipine (NORVASC) 10 MG tablet     2. Would you like to learn more about the convenience, safety, & potential cost savings by using the [facility]? No   3. Are you open to using the [facility] No   4. Which pharmacy/location (including street and city if local pharmacy) is medication to be sent to?[facility] - [place] ([place]), [place] - [address]    5. Do they need a 30 day or 90 day supply? 90 Day Supply

## 2023-10-14 NOTE — Telephone Encounter (Signed)
Rx already sent to pharmacy 10/13/23

## 2023-10-21 DIAGNOSIS — Z0000 Encounter for general adult medical examination without abnormal findings: Secondary | ICD-10-CM | POA: Diagnosis not present

## 2023-10-21 DIAGNOSIS — I10 Essential (primary) hypertension: Secondary | ICD-10-CM | POA: Diagnosis not present

## 2023-10-21 DIAGNOSIS — E7800 Pure hypercholesterolemia, unspecified: Secondary | ICD-10-CM | POA: Diagnosis not present

## 2023-10-21 DIAGNOSIS — N1832 Chronic kidney disease, stage 3b: Secondary | ICD-10-CM | POA: Diagnosis not present

## 2023-10-21 DIAGNOSIS — E1122 Type 2 diabetes mellitus with diabetic chronic kidney disease: Secondary | ICD-10-CM | POA: Diagnosis not present

## 2023-10-23 ENCOUNTER — Other Ambulatory Visit: Payer: Self-pay | Admitting: Family Medicine

## 2023-10-23 DIAGNOSIS — E2839 Other primary ovarian failure: Secondary | ICD-10-CM

## 2023-12-15 ENCOUNTER — Telehealth: Payer: Self-pay | Admitting: Podiatry

## 2023-12-15 NOTE — Telephone Encounter (Signed)
Pt left message on 1/24 at 431pm stating her big toenail maybe ingrown.  I returned call to see if I could get her scheduled  and it just rings

## 2023-12-24 ENCOUNTER — Ambulatory Visit: Payer: Medicare Other | Admitting: Podiatry

## 2023-12-24 ENCOUNTER — Encounter: Payer: Self-pay | Admitting: Podiatry

## 2023-12-24 VITALS — Wt 179.0 lb

## 2023-12-24 DIAGNOSIS — M79675 Pain in left toe(s): Secondary | ICD-10-CM

## 2023-12-24 DIAGNOSIS — M79674 Pain in right toe(s): Secondary | ICD-10-CM

## 2023-12-24 DIAGNOSIS — E1142 Type 2 diabetes mellitus with diabetic polyneuropathy: Secondary | ICD-10-CM

## 2023-12-24 DIAGNOSIS — I739 Peripheral vascular disease, unspecified: Secondary | ICD-10-CM

## 2023-12-24 DIAGNOSIS — B351 Tinea unguium: Secondary | ICD-10-CM | POA: Diagnosis not present

## 2023-12-24 NOTE — Progress Notes (Signed)
  Subjective:  Patient ID: Felicia Shelton, female    DOB: 1941/10/27,  MRN: [id number]  Felicia Shelton presents to clinic today for painful, elongated thickened toenails x 10 which are symptomatic when wearing enclosed shoe gear. This interferes with his/her daily activities. She states right great toe feels like it is ingrown.  Chief Complaint  Patient presents with   dfc    She is here for a nail trim, last A1C was 6.4, PCP is Dr. Chrystal.  The right big toe felt like it was digging into my skin and  I think it was bleeding    PCP is Chrystal Lamarr RAMAN, MD.  No Known Allergies  Review of Systems: Negative except as noted in the HPI.  Objective: No changes noted in today's physical examination. There were no vitals filed for this visit. Felicia Shelton is a pleasant 83 y.o. female WD, WN in NAD. AAO x 3.  Vascular Examination: CFT <3 seconds b/l. DP/PT pulses faintly palpable b/l. Skin temperature gradient warm to warm b/l. No pain with calf compression. No ischemia or gangrene. No cyanosis or clubbing noted b/l.   Neurological Examination: Sensation grossly intact b/l with 10 gram monofilament. Vibratory sensation intact b/l.   Dermatological Examination: Pedal skin warm and supple b/l.   No open wounds. No interdigital macerations.  Toenails 1-5 b/l thick, discolored, elongated with subungual debris and pain on dorsal palpation.  Incurvated nailplate right great toe medial border(s) with tenderness to palpation. No erythema, no edema, no drainage noted.  Musculoskeletal Examination: Normal muscle strength 5/5 to all lower extremity muscle groups bilaterally. HAV with bunion deformity noted b/l LE. Hammertoe deformity noted 2-5 b/l.  Radiographs: None  Assessment/Plan: 1. Pain due to onychomycosis of toenails of both feet   2. PAD (peripheral artery disease) (HCC)   3. Diabetic polyneuropathy associated with type 2 diabetes mellitus (HCC)    Patient was evaluated and  treated. All patient's and/or POA's questions/concerns addressed on today's visit. Toenails 1-5 debrided in length and girth without incident. Continue foot and shoe inspections daily. Monitor blood glucose per PCP/Endocrinologist's recommendations. Continue soft, supportive shoe gear daily. Report any pedal injuries to medical professional. Call office if there are any questions/concerns. -Patient/POA to call should there be question/concern in the interim.   Return in about 3 months (around 03/22/2024).  Felicia Shelton, DPM      [facility] LOCATION: [address], [place] [address]                   Office [phone]   [facility] LOCATION: [address] [place], [place] [address] Office [phone]

## 2024-01-20 ENCOUNTER — Telehealth: Payer: Self-pay | Admitting: Podiatry

## 2024-01-20 NOTE — Telephone Encounter (Signed)
 Patient is hoping Dr. Eloy End will remember the doctor, she recommended patient to see regarding issues with her big toe. Patient was unable to give any other information. She said Dr. Eloy End was supposed to give her the information before she left the office on last visit 12/24/23

## 2024-01-21 ENCOUNTER — Ambulatory Visit: Payer: Medicare Other | Admitting: Cardiology

## 2024-02-03 ENCOUNTER — Encounter: Payer: Self-pay | Admitting: Podiatry

## 2024-02-03 ENCOUNTER — Ambulatory Visit: Admitting: Podiatry

## 2024-02-03 ENCOUNTER — Ambulatory Visit (INDEPENDENT_AMBULATORY_CARE_PROVIDER_SITE_OTHER)

## 2024-02-03 VITALS — Ht 65.0 in | Wt 179.0 lb

## 2024-02-03 DIAGNOSIS — M899 Disorder of bone, unspecified: Secondary | ICD-10-CM

## 2024-02-03 NOTE — Patient Instructions (Signed)
 More silicone pads can be purchased from:  [email or web address]    Let me know if you would like to have the bone spur removed from the toe

## 2024-02-08 NOTE — Progress Notes (Signed)
  Subjective:  Patient ID: Felicia Shelton, female    DOB: January 23, 1941,  MRN: [id number]  Chief Complaint  Patient presents with   Foot Pain    " I have had foot pain since January and I was concerned since I am a diabetic, I thought I may have an ingrown nail but it gets sore, no numbness or tingling and no swelling, I will use fungi nail for a couple of days and it gets better and then pain is back"     83 y.o. female presents with the above complaint. History confirmed with patient.   Objective:  Physical Exam: warm, good capillary refill, no trophic changes or ulcerative lesions, normal DP and PT pulses, normal sensory exam, and right hallux painful at base of nail medially, palpable bony prominence here  Radiographs: Multiple views x-ray of the right foot: Subungual exostosis noted medially likely consistent with an osteochondroma Assessment:   1. Subungual exostosis of great toe      Plan:  Patient was evaluated and treated and all questions answered.  We reviewed her x-rays discussed the presence of the subungual exostosis likely an osteochondroma and discussed the nature of these.  Discussed nonsurgical treatment options including offloading with silicone padding which I dispensed and she will trial this.  If this fails she will return me for surgical consultation for excision.  Follow-up as needed.  Return if symptoms worsen or fail to improve.

## 2024-03-11 ENCOUNTER — Encounter: Payer: Self-pay | Admitting: Cardiology

## 2024-03-11 ENCOUNTER — Ambulatory Visit: Payer: Medicare Other | Attending: Cardiology | Admitting: Cardiology

## 2024-03-11 VITALS — BP 126/80 | HR 91 | Ht 65.0 in | Wt 170.0 lb

## 2024-03-11 DIAGNOSIS — I361 Nonrheumatic tricuspid (valve) insufficiency: Secondary | ICD-10-CM | POA: Diagnosis not present

## 2024-03-11 DIAGNOSIS — E7800 Pure hypercholesterolemia, unspecified: Secondary | ICD-10-CM

## 2024-03-11 DIAGNOSIS — I10 Essential (primary) hypertension: Secondary | ICD-10-CM

## 2024-03-11 MED ORDER — METOPROLOL SUCCINATE ER 50 MG PO TB24: 50.0000 mg | ORAL_TABLET | Freq: Every day | ORAL | 3 refills | Status: AC

## 2024-03-11 MED ORDER — EZETIMIBE 10 MG PO TABS: 10.0000 mg | ORAL_TABLET | Freq: Every day | ORAL | 3 refills | Status: DC

## 2024-03-11 NOTE — Patient Instructions (Signed)
 Medication Instructions:  Your physician has recommended you make the following change in your medication:  1-Change to Metoprolol  Succinate 50 mg by mouth daily. 2- START Zetia  10 mg by mouth daily *If you need a refill on your cardiac medications before your next appointment, please call your pharmacy*  Lab Work: If you have labs (blood work) drawn today and your tests are completely normal, you will receive your results only by: MyChart Message (if you have MyChart) OR A paper copy in the mail If you have any lab test that is abnormal or we need to change your treatment, we will call you to review the results.  Testing/Procedures: Your physician has requested that you have an echocardiogram. Echocardiography is a painless test that uses sound waves to create images of your heart. It provides your doctor with information about the size and shape of your heart and how well your heart's chambers and valves are working. This procedure takes approximately one hour. There are no restrictions for this procedure. Please do NOT wear cologne, perfume, aftershave, or lotions (deodorant is allowed). Please arrive 15 minutes prior to your appointment time.  Please note: We ask at that you not bring children with you during ultrasound (echo/ vascular) testing. Due to room size and safety concerns, children are not allowed in the ultrasound rooms during exams. Our front office staff cannot provide observation of children in our lobby area while testing is being conducted. An adult accompanying a patient to their appointment will only be allowed in the ultrasound room at the discretion of the ultrasound technician under special circumstances. We apologize for any inconvenience. Follow-Up: At [facility], you and your health needs are our priority.  As part of our continuing mission to provide you with exceptional heart care, our providers are all part of one team.  This team includes your primary  Cardiologist (physician) and Advanced Practice Providers or APPs (Physician Assistants and Nurse Practitioners) who all work together to provide you with the care you need, when you need it.  Your next appointment:   As needed  Provider:   Dr. Berry Bristol    We recommend signing up for the patient portal called "MyChart".  Sign up information is provided on this After Visit Summary.  MyChart is used to connect with patients for Virtual Visits (Telemedicine).  Patients are able to view lab/test results, encounter notes, upcoming appointments, etc.  Non-urgent messages can be sent to your provider as well.   To learn more about what you can do with MyChart, go to [email or web address].   Other Instructions      1st Floor: - Lobby - Registration  - Pharmacy  - Lab - Cafe  2nd Floor: - PV Lab - Diagnostic Testing (echo, CT, nuclear med)  3rd Floor: - Vacant  4th Floor: - TCTS (cardiothoracic surgery) - AFib Clinic - Structural Heart Clinic - Vascular Surgery  - Vascular Ultrasound  5th Floor: - HeartCare Cardiology (general and EP) - Clinical Pharmacy for coumadin, hypertension, lipid, weight-loss medications, and med management appointments    Valet parking services will be available as well.

## 2024-03-11 NOTE — Progress Notes (Signed)
 Cardiology Office Note:  .   Date:  03/13/2024  ID:  Felicia Shelton, DOB 1941/03/17, MRN [id number] PCP: Ransom Byers, MD  [facility] Providers Cardiologist:  None   History of Present Illness: .   Felicia Shelton is a 83 y.o. African-American female patient who is fairly active, primary hypertension, diabetes mellitus type 2 with stage II chronic kidney disease, hyperlipidemia on therapy, was had a essentially normal echocardiogram in 08/21/2022 with mild aortic leaflet calcification and mild to moderate TR with normal PA pressure, low risk nuclear stress test on 09/30/2022 without ischemia and EF 62% presents here for annual visit.  She remains asymptomatic.  Discussed the use of AI scribe software for clinical note transcription with the patient, who gave verbal consent to proceed.  History of Present Illness Ms. Felicia Shelton, a patient with a history of diabetes, kidney disease stage II-IIIa, hypertension and hypercholesterolemia and moderate tricuspid regurgitation by echocardiogram in 2023 presents for annual follow-up.  She denies any shortness of breath and reports weight loss due to decreased appetite. She and her husband eat once or twice a day, often skipping meals. She also mentions using an exercise bike for two to three hours a day, which she believes has helped with her neuropathy and circulation in her legs.  She also complains of mild ankle edema at the end of the day.  Labs   External Labs:  [facility] labs from PCP 07/04/2023:  Total cholesterol 181, triglycerides 59, HDL 60, LDL 110.  TSH normal at 1.780.  Labs 01/30/2024:  A1c 5.3%.  BUN 13, creatinine 0.92, EGFR 87 mL.  Potassium 4.0.  Hb 13.1.  Review of Systems  Cardiovascular:  Positive for leg swelling (slightly worse than last year). Negative for chest pain and dyspnea on exertion.   Physical Exam:   VS:  BP 126/80 (BP Location: Left Arm, Patient Position: Sitting, Cuff Size: Normal)   Pulse 91   Ht 5\' 5"   (1.651 m)   Wt 170 lb (77.1 kg)   SpO2 96%   BMI 28.29 kg/m    Wt Readings from Last 3 Encounters:  03/11/24 170 lb (77.1 kg)  02/03/24 179 lb (81.2 kg)  12/24/23 179 lb (81.2 kg)    Physical Exam Neck:     Vascular: No JVD.  Cardiovascular:     Rate and Rhythm: Normal rate and regular rhythm.     Pulses: Intact distal pulses.     Heart sounds: S1 normal and S2 normal. Murmur heard.     Midsystolic murmur is present with a grade of 2/6 at the lower left sternal border and lower right sternal border.     No gallop.  Pulmonary:     Effort: Pulmonary effort is normal.     Breath sounds: Normal breath sounds.  Abdominal:     General: Bowel sounds are normal.     Palpations: Abdomen is soft.  Musculoskeletal:     Right lower leg: No edema.     Left lower leg: No edema.    Studies Reviewed: .    Echocardiogram 08/21/2022: Left ventricle cavity is normal in size and wall thickness. Normal global wall motion. Normal LV systolic function with EF 61%. Doppler evidence of grade I (impaired) diastolic dysfunction, normal LAP. Mild aortic valve leaflet calcification. No significant stenosis or regurgitation. Mild to moderate mitral regurgitation. Mild to moderate tricuspid regurgitation. Estimated pulmonary artery systolic pressure 30 mmHg.  EKG:    EKG Interpretation Date/Time:  Thursday March 11 2024 16:34:31 EDT Ventricular Rate:  91 PR Interval:  138 QRS Duration:  76 QT Interval:  368 QTC Calculation: 452 R Axis:   32  Text Interpretation: EKG 03/11/2024: Normal sinus rhythm at the rate of 91 bpm, right atrial enlargement, normal axis, no evidence of ischemia otherwise normal EKG. Compared to 04/17/2021, right atrial abnormality new. Confirmed by Davit Vassar, Jagadeesh ([id number]) on 03/11/2024 4:48:19 PM    Medications and allergies    No Known Allergies   Current Outpatient Medications:    acetaminophen  (TYLENOL ) 650 MG CR tablet, Take 1,300 mg by mouth daily as needed for pain.,  Disp: , Rfl:    amLODipine  (NORVASC ) 10 MG tablet, Take 1 tablet by mouth once daily, Disp: 90 tablet, Rfl: 0   atorvastatin  (LIPITOR) 40 MG tablet, Take 40 mg by mouth daily., Disp: , Rfl:    b complex vitamins capsule, Take 1 capsule by mouth daily., Disp: , Rfl:    Cholecalciferol  (VITAMIN D-3) 5000 units TABS, Take 5,000 Units by mouth every Monday, Wednesday, and Friday. , Disp: , Rfl:    ezetimibe  (ZETIA ) 10 MG tablet, Take 1 tablet (10 mg total) by mouth daily., Disp: 90 tablet, Rfl: 3   FARXIGA 10 MG TABS tablet, Take 10 mg by mouth daily., Disp: , Rfl:    fexofenadine-pseudoephedrine (ALLEGRA-D) 60-120 MG 12 hr tablet, Take by mouth as needed., Disp: , Rfl:    fluticasone  (FLONASE ) 50 MCG/ACT nasal spray, , Disp: , Rfl:    gabapentin  (NEURONTIN ) 300 MG capsule, Take 300 mg by mouth daily., Disp: , Rfl:    Homeopathic Products (THERAWORX RELIEF EX), Apply 1 application topically daily as needed (cramps)., Disp: , Rfl:    lisinopril  (ZESTRIL ) 20 MG tablet, Take 1 tablet by mouth daily., Disp: , Rfl:    metFORMIN  (GLUCOPHAGE ) 500 MG tablet, Take 250 mg by mouth 2 (two) times daily., Disp: , Rfl:    Multiple Vitamins-Minerals (CENTRUM ADULT PO), Take by mouth., Disp: , Rfl:    OVER THE COUNTER MEDICATION, Apply 1 application topically daily as needed (pain). Pain relief cream with lidocaine , Disp: , Rfl:    metoprolol  succinate (TOPROL -XL) 50 MG 24 hr tablet, Take 1 tablet (50 mg total) by mouth daily., Disp: 90 tablet, Rfl: 3   Meds ordered this encounter  Medications   ezetimibe  (ZETIA ) 10 MG tablet    Sig: Take 1 tablet (10 mg total) by mouth daily.    Dispense:  90 tablet    Refill:  3   metoprolol  succinate (TOPROL -XL) 50 MG 24 hr tablet    Sig: Take 1 tablet (50 mg total) by mouth daily.    Dispense:  90 tablet    Refill:  3     Medications Discontinued During This Encounter  Medication Reason   metoprolol  tartrate (LOPRESSOR ) 50 MG tablet Discontinued by provider    metoprolol  succinate (TOPROL -XL) 50 MG 24 hr tablet Reorder     ASSESSMENT AND PLAN: .      ICD-10-CM   1. Nonrheumatic tricuspid valve regurgitation  I36.1 ECHOCARDIOGRAM COMPLETE    2. Primary hypertension  I10 EKG 12-Lead    ECHOCARDIOGRAM COMPLETE    3. Hypercholesteremia  E78.00 ezetimibe  (ZETIA ) 10 MG tablet    ECHOCARDIOGRAM COMPLETE      Assessment and Plan Assessment & Plan Tricuspid valve regurgitation   Moderate tricuspid valve regurgitation was noted on the echocardiogram from August 21, 2022. Current examination shows no worsening, and she has no dyspnea or significant heart  failure symptoms. Leg swelling is unlikely related to the valve regurgitation.  I suspect amlodipine  is causing very mild trace ankle edema.  Order an echocardiogram to assess the current status of the tricuspid valve regurgitation.  Unless there is worsening tricuspid regurgitation or significant valve pathology, I will see her back on a as needed basis and will ask Dr. Denna Fish to take over her care and send her back if she develops worsening dyspnea, much more pronounced murmur or any cardiac symptoms.  Hypertension   Her blood pressure is well controlled with amlodipine  10 mg once daily, lisinopril  20 mg daily and metoprolol  tartrate, which she takes once daily instead of twice. Amlodipine  may contribute to leg swelling. Change metoprolol  tartrate to metoprolol  succinate 50 mg once daily. Instruct her to finish the current metoprolol  tartrate 50 mg by taking half in the morning and half at night. Monitor blood pressure regularly.  Leg swelling due to amlodipine    Leg swelling occurs at the end of the day and resolves by morning, likely related to amlodipine  use rather than tricuspid valve regurgitation or heart failure. Consider adjusting amlodipine  if swelling worsens.  Today on my examination she did not have any edema.  Would not discontinue amlodipine  as the blood pressure is  well-controlled.  Hyperlipidemia   Cholesterol levels are not well controlled, especially given increased cardiovascular risk due to diabetes. She is currently taking atorvastatin  40 mg daily. Add Zetia  to the current atorvastatin  regimen and send the prescription to [facility].  Goal LDL closer to 70 if not at least <100 in view of diabetes mellitus.  Again I will request Dr. Donia Furlough to Take over the Management, Patient Is Seeing Her This June 2025.  Type 2 diabetes mellitus   Diabetes is well controlled with an A1c of 5.3%. She is currently taking Farxiga, which also aids in blood pressure control.  External labs reviewed, renal function is very stable.  Unless echocardiogram is abnormal we will see her back on a as needed basis.  To look forward would be worsening heart murmur intensity, worsening dyspnea or heart failure symptoms including leg edema.  Signed,  Knox Perl, MD, [facility] 03/13/2024, 8:47 AM [facility] [address] [place], [place] [address] Phone: [phone]. Fax:  [phone]

## 2024-03-13 ENCOUNTER — Encounter: Payer: Self-pay | Admitting: Cardiology

## 2024-03-17 DIAGNOSIS — E1122 Type 2 diabetes mellitus with diabetic chronic kidney disease: Secondary | ICD-10-CM | POA: Diagnosis not present

## 2024-03-17 DIAGNOSIS — N1832 Chronic kidney disease, stage 3b: Secondary | ICD-10-CM | POA: Diagnosis not present

## 2024-03-17 DIAGNOSIS — N281 Cyst of kidney, acquired: Secondary | ICD-10-CM | POA: Diagnosis not present

## 2024-03-17 DIAGNOSIS — I129 Hypertensive chronic kidney disease with stage 1 through stage 4 chronic kidney disease, or unspecified chronic kidney disease: Secondary | ICD-10-CM | POA: Diagnosis not present

## 2024-03-23 DIAGNOSIS — E7800 Pure hypercholesterolemia, unspecified: Secondary | ICD-10-CM | POA: Diagnosis not present

## 2024-03-23 DIAGNOSIS — I361 Nonrheumatic tricuspid (valve) insufficiency: Secondary | ICD-10-CM | POA: Diagnosis not present

## 2024-03-23 DIAGNOSIS — E1122 Type 2 diabetes mellitus with diabetic chronic kidney disease: Secondary | ICD-10-CM | POA: Diagnosis not present

## 2024-03-23 DIAGNOSIS — I10 Essential (primary) hypertension: Secondary | ICD-10-CM | POA: Diagnosis not present

## 2024-03-29 ENCOUNTER — Encounter: Payer: Self-pay | Admitting: Podiatry

## 2024-03-29 ENCOUNTER — Ambulatory Visit (INDEPENDENT_AMBULATORY_CARE_PROVIDER_SITE_OTHER): Payer: Medicare Other | Admitting: Podiatry

## 2024-03-29 DIAGNOSIS — M79675 Pain in left toe(s): Secondary | ICD-10-CM

## 2024-03-29 DIAGNOSIS — B351 Tinea unguium: Secondary | ICD-10-CM

## 2024-03-29 DIAGNOSIS — M79674 Pain in right toe(s): Secondary | ICD-10-CM

## 2024-03-29 NOTE — Progress Notes (Signed)
 Subjective:  Patient ID: Felicia Shelton, female    DOB: 19-Nov-1940,  MRN: [id number]  Felicia Shelton presents to clinic today for:  Chief Complaint  Patient presents with   [facility]    RM#15 RFC    Patient notes nails are thick, discolored, elongated and painful in shoegear when trying to ambulate.  States her last A1c was 6.4.  She ambulates with the use of a cane for assistance.  She states she already has her next appointment scheduled with Dr. Johnette Shelton.  She saw Dr. Michalene Shelton recently who diagnosed a subungual exostosis on the great toe which was causing feeling of an ingrown toenail.  She wanted to know if she needs to have this taken care of.  She notes that she really does not have any pain to the area.  PCP is Felicia Byers, MD.  Past Medical History:  Diagnosis Date   Allergy    Arthritis    back   Diabetes mellitus without complication (HCC)    type 2    GERD (gastroesophageal reflux disease)    Hx of adenomatous colonic polyp 05/26/2008   Hyperlipidemia    Hypertension    Neuromuscular disorder [facility])    Past Surgical History:  Procedure Laterality Date   ABDOMINAL HYSTERECTOMY     APPENDECTOMY     BREAST EXCISIONAL BIOPSY Left    BREAST SURGERY     left breast   CATARACT EXTRACTION Right 02/2020   COLONOSCOPY  10/11/2015   polyps 2009   JOINT REPLACEMENT     TONSILLECTOMY     removed age 52-5   TOTAL KNEE ARTHROPLASTY Left 04/17/2021   Procedure: LEFT TOTAL KNEE ARTHROPLASTY;  Surgeon: Felicia Even, MD;  Location: [facility] ORS;  Service: Orthopedics;  Laterality: Left;   TRANSFORAMINAL LUMBAR INTERBODY FUSION (TLIF) WITH PEDICLE SCREW FIXATION 2 LEVEL Right 12/31/2018   Procedure: RIGHT - SIDED LUMBAR 2-3,LUMBAR 3-4, LUMBAR 4-5 TRANSFORAMINAL LUMBAR INTERBODY FUSION WITH INSTRUMENTATION AND ALLOGRAFT;  Surgeon: Felicia Grimes, MD;  Location: [facility] OR;  Service: Orthopedics;  Laterality: Right;   No Known Allergies  Review of Systems: Negative except as noted in the  HPI.  Objective:  Felicia Shelton is a pleasant 83 y.o. female in NAD. AAO x 3.  Vascular Examination: Capillary refill time is 3-5 seconds to toes bilateral. Palpable pedal pulses b/l LE. Digital hair present b/l.  Skin temperature gradient WNL b/l. No varicosities b/l. No cyanosis noted b/l.   Dermatological Examination: Pedal skin with normal turgor, texture and tone b/l. No open wounds. No interdigital macerations b/l. Toenails x10 are 3mm thick, discolored, dystrophic with subungual debris. There is pain with compression of the nail plates.  They are elongated x10  Assessment/Plan: 1. Pain due to onychomycosis of toenails of both feet    The mycotic toenails were sharply debrided x10 with sterile nail nippers and a power debriding burr to decrease bulk/thickness and length.    Patient does not need to have the subungual exostosis removed at this time.  On x-ray it appears very small.  I do recommend annually that she get a repeat x-ray just to check the size of the spur.  Most likely this will not progress very much if at all.  F/u 3 months   Felicia Shelton, DPM, FACFAS [facility]     [address]  [place], [place] [address]                Office [phone]  Fax [phone]

## 2024-04-09 ENCOUNTER — Encounter (HOSPITAL_COMMUNITY): Payer: Self-pay | Admitting: Emergency Medicine

## 2024-04-09 ENCOUNTER — Inpatient Hospital Stay (HOSPITAL_COMMUNITY)
Admission: EM | Admit: 2024-04-09 | Discharge: 2024-04-14 | DRG: 871 | Disposition: A | Discharge status: Skilled Nursing Facility | Attending: Internal Medicine | Admitting: Internal Medicine

## 2024-04-09 ENCOUNTER — Emergency Department (HOSPITAL_COMMUNITY)

## 2024-04-09 ENCOUNTER — Other Ambulatory Visit: Payer: Self-pay

## 2024-04-09 DIAGNOSIS — E1122 Type 2 diabetes mellitus with diabetic chronic kidney disease: Secondary | ICD-10-CM | POA: Diagnosis not present

## 2024-04-09 DIAGNOSIS — A419 Sepsis, unspecified organism: Secondary | ICD-10-CM | POA: Diagnosis not present

## 2024-04-09 DIAGNOSIS — J189 Pneumonia, unspecified organism: Secondary | ICD-10-CM | POA: Diagnosis present

## 2024-04-09 DIAGNOSIS — R410 Disorientation, unspecified: Secondary | ICD-10-CM | POA: Diagnosis not present

## 2024-04-09 DIAGNOSIS — R000 Tachycardia, unspecified: Secondary | ICD-10-CM | POA: Diagnosis not present

## 2024-04-09 DIAGNOSIS — Z96652 Presence of left artificial knee joint: Secondary | ICD-10-CM | POA: Diagnosis present

## 2024-04-09 DIAGNOSIS — G9341 Metabolic encephalopathy: Secondary | ICD-10-CM | POA: Diagnosis not present

## 2024-04-09 DIAGNOSIS — I1310 Hypertensive heart and chronic kidney disease without heart failure, with stage 1 through stage 4 chronic kidney disease, or unspecified chronic kidney disease: Secondary | ICD-10-CM | POA: Diagnosis present

## 2024-04-09 DIAGNOSIS — E7800 Pure hypercholesterolemia, unspecified: Secondary | ICD-10-CM | POA: Insufficient documentation

## 2024-04-09 DIAGNOSIS — R6520 Severe sepsis without septic shock: Principal | ICD-10-CM | POA: Diagnosis present

## 2024-04-09 DIAGNOSIS — N189 Chronic kidney disease, unspecified: Secondary | ICD-10-CM | POA: Diagnosis present

## 2024-04-09 DIAGNOSIS — Z800 Family history of malignant neoplasm of digestive organs: Secondary | ICD-10-CM | POA: Diagnosis not present

## 2024-04-09 DIAGNOSIS — Z7984 Long term (current) use of oral hypoglycemic drugs: Secondary | ICD-10-CM

## 2024-04-09 DIAGNOSIS — K219 Gastro-esophageal reflux disease without esophagitis: Secondary | ICD-10-CM | POA: Diagnosis present

## 2024-04-09 DIAGNOSIS — E1169 Type 2 diabetes mellitus with other specified complication: Secondary | ICD-10-CM | POA: Diagnosis present

## 2024-04-09 DIAGNOSIS — I10 Essential (primary) hypertension: Secondary | ICD-10-CM | POA: Diagnosis not present

## 2024-04-09 DIAGNOSIS — Z90710 Acquired absence of both cervix and uterus: Secondary | ICD-10-CM | POA: Diagnosis not present

## 2024-04-09 DIAGNOSIS — R41841 Cognitive communication deficit: Secondary | ICD-10-CM | POA: Diagnosis not present

## 2024-04-09 DIAGNOSIS — N179 Acute kidney failure, unspecified: Secondary | ICD-10-CM | POA: Diagnosis not present

## 2024-04-09 DIAGNOSIS — D649 Anemia, unspecified: Secondary | ICD-10-CM | POA: Diagnosis not present

## 2024-04-09 DIAGNOSIS — R739 Hyperglycemia, unspecified: Secondary | ICD-10-CM | POA: Diagnosis not present

## 2024-04-09 DIAGNOSIS — Z79899 Other long term (current) drug therapy: Secondary | ICD-10-CM

## 2024-04-09 DIAGNOSIS — Z87891 Personal history of nicotine dependence: Secondary | ICD-10-CM

## 2024-04-09 DIAGNOSIS — Z860101 Personal history of adenomatous and serrated colon polyps: Secondary | ICD-10-CM

## 2024-04-09 DIAGNOSIS — R2681 Unsteadiness on feet: Secondary | ICD-10-CM | POA: Diagnosis not present

## 2024-04-09 DIAGNOSIS — E119 Type 2 diabetes mellitus without complications: Secondary | ICD-10-CM | POA: Diagnosis not present

## 2024-04-09 DIAGNOSIS — E876 Hypokalemia: Secondary | ICD-10-CM | POA: Diagnosis not present

## 2024-04-09 DIAGNOSIS — Z7401 Bed confinement status: Secondary | ICD-10-CM | POA: Diagnosis not present

## 2024-04-09 DIAGNOSIS — R4182 Altered mental status, unspecified: Secondary | ICD-10-CM | POA: Diagnosis not present

## 2024-04-09 DIAGNOSIS — E785 Hyperlipidemia, unspecified: Secondary | ICD-10-CM | POA: Diagnosis not present

## 2024-04-09 DIAGNOSIS — M6281 Muscle weakness (generalized): Secondary | ICD-10-CM | POA: Diagnosis not present

## 2024-04-09 DIAGNOSIS — R9431 Abnormal electrocardiogram [ECG] [EKG]: Secondary | ICD-10-CM | POA: Diagnosis not present

## 2024-04-09 DIAGNOSIS — N1832 Chronic kidney disease, stage 3b: Secondary | ICD-10-CM | POA: Diagnosis not present

## 2024-04-09 DIAGNOSIS — R2689 Other abnormalities of gait and mobility: Secondary | ICD-10-CM | POA: Diagnosis not present

## 2024-04-09 LAB — HEMOGLOBIN A1C
Hgb A1c MFr Bld: 6.3 % — ABNORMAL HIGH (ref 4.8–5.6)
Mean Plasma Glucose: 134.11 mg/dL

## 2024-04-09 LAB — URINALYSIS, ROUTINE W REFLEX MICROSCOPIC
Bilirubin Urine: NEGATIVE
Glucose, UA: >=500 mg/dL — AB
Ketones, ur: 5 mg/dL — AB
Leukocytes,Ua: NEGATIVE
Nitrite: NEGATIVE
Protein, ur: 100 mg/dL — AB
Specific Gravity, Urine: 1.016 (ref 1.005–1.030)
pH: 6 (ref 5.0–8.0)

## 2024-04-09 LAB — CBC
HCT: 36.5 % (ref 36.0–46.0)
Hemoglobin: 12.8 g/dL (ref 12.0–15.0)
MCH: 27.8 pg (ref 26.0–34.0)
MCHC: 35.1 g/dL (ref 30.0–36.0)
MCV: 79.2 fL — ABNORMAL LOW (ref 80.0–100.0)
Platelets: 201 10*3/uL (ref 150–400)
RBC: 4.61 MIL/uL (ref 3.87–5.11)
RDW: 14.7 % (ref 11.5–15.5)
WBC: 17.7 10*3/uL — ABNORMAL HIGH (ref 4.0–10.5)
nRBC: 0 % (ref 0.0–0.2)

## 2024-04-09 LAB — GLUCOSE, CAPILLARY: Glucose-Capillary: 173 mg/dL — ABNORMAL HIGH (ref 70–99)

## 2024-04-09 LAB — RAPID URINE DRUG SCREEN, HOSP PERFORMED
Amphetamines: NOT DETECTED
Barbiturates: NOT DETECTED
Benzodiazepines: NOT DETECTED
Cocaine: NOT DETECTED
Opiates: NOT DETECTED
Tetrahydrocannabinol: NOT DETECTED

## 2024-04-09 LAB — I-STAT VENOUS BLOOD GAS, ED
Acid-Base Excess: 0 mmol/L (ref 0.0–2.0)
Bicarbonate: 24.2 mmol/L (ref 20.0–28.0)
Calcium, Ion: 1.06 mmol/L — ABNORMAL LOW (ref 1.15–1.40)
HCT: 36 % (ref 36.0–46.0)
Hemoglobin: 12.2 g/dL (ref 12.0–15.0)
O2 Saturation: 92 %
Potassium: 2.9 mmol/L — ABNORMAL LOW (ref 3.5–5.1)
Sodium: 137 mmol/L (ref 135–145)
TCO2: 25 mmol/L (ref 22–32)
pCO2, Ven: 39.1 mmHg — ABNORMAL LOW (ref 44–60)
pH, Ven: 7.4 (ref 7.25–7.43)
pO2, Ven: 62 mmHg — ABNORMAL HIGH (ref 32–45)

## 2024-04-09 LAB — ETHANOL: Alcohol, Ethyl (B): <15 mg/dL (ref <15)

## 2024-04-09 LAB — CBG MONITORING, ED
Glucose-Capillary: 241 mg/dL — ABNORMAL HIGH (ref 70–99)
Glucose-Capillary: 194 mg/dL — ABNORMAL HIGH (ref 70–99)

## 2024-04-09 LAB — COMPREHENSIVE METABOLIC PANEL WITH GFR
ALT: 27 U/L (ref 0–44)
AST: 48 U/L — ABNORMAL HIGH (ref 15–41)
Albumin: 2.8 g/dL — ABNORMAL LOW (ref 3.5–5.0)
Alkaline Phosphatase: 59 U/L (ref 38–126)
Anion gap: 15 (ref 5–15)
BUN: 16 mg/dL (ref 8–23)
CO2: 23 mmol/L (ref 22–32)
Calcium: 8.8 mg/dL — ABNORMAL LOW (ref 8.9–10.3)
Chloride: 97 mmol/L — ABNORMAL LOW (ref 98–111)
Creatinine, Ser: 1.63 mg/dL — ABNORMAL HIGH (ref 0.44–1.00)
GFR, Estimated: 31 mL/min — ABNORMAL LOW (ref >60)
Glucose, Bld: 233 mg/dL — ABNORMAL HIGH (ref 70–99)
Potassium: 2.8 mmol/L — ABNORMAL LOW (ref 3.5–5.1)
Sodium: 135 mmol/L (ref 135–145)
Total Bilirubin: 1.1 mg/dL (ref 0.0–1.2)
Total Protein: 6.9 g/dL (ref 6.5–8.1)

## 2024-04-09 LAB — I-STAT CG4 LACTIC ACID, ED
Lactic Acid, Venous: 2 mmol/L (ref 0.5–1.9)
Lactic Acid, Venous: 1.9 mmol/L (ref 0.5–1.9)

## 2024-04-09 LAB — MAGNESIUM: Magnesium: 1.9 mg/dL (ref 1.7–2.4)

## 2024-04-09 MED ORDER — EZETIMIBE 10 MG PO TABS
10.0000 mg | ORAL_TABLET | Freq: Every day | ORAL | Status: DC
  Administered 2024-04-10 – 2024-04-14 (×5): 10 mg via ORAL
  Filled 2024-04-09 (×5): qty 1

## 2024-04-09 MED ORDER — DOXYCYCLINE HYCLATE 100 MG PO TABS
100.0000 mg | ORAL_TABLET | Freq: Once | ORAL | Status: AC
  Administered 2024-04-09: 100 mg via ORAL
  Filled 2024-04-09: qty 1

## 2024-04-09 MED ORDER — LACTATED RINGERS IV BOLUS (SEPSIS)
500.0000 mL | Freq: Once | INTRAVENOUS | Status: AC
  Administered 2024-04-09: 500 mL via INTRAVENOUS

## 2024-04-09 MED ORDER — SODIUM CHLORIDE 0.9 % IV BOLUS
1000.0000 mL | Freq: Once | INTRAVENOUS | Status: AC
  Administered 2024-04-09: 1000 mL via INTRAVENOUS

## 2024-04-09 MED ORDER — LACTATED RINGERS IV SOLN: INTRAVENOUS | Status: AC

## 2024-04-09 MED ORDER — INSULIN ASPART 100 UNIT/ML IJ SOLN
0.0000 [IU] | Freq: Three times a day (TID) | INTRAMUSCULAR | Status: DC
  Administered 2024-04-09 – 2024-04-10 (×2): 2 [IU] via SUBCUTANEOUS
  Administered 2024-04-10: 1 [IU] via SUBCUTANEOUS
  Administered 2024-04-10: 2 [IU] via SUBCUTANEOUS
  Administered 2024-04-11 (×2): 3 [IU] via SUBCUTANEOUS
  Administered 2024-04-12 – 2024-04-13 (×4): 2 [IU] via SUBCUTANEOUS
  Administered 2024-04-13: 3 [IU] via SUBCUTANEOUS
  Administered 2024-04-13 – 2024-04-14 (×3): 2 [IU] via SUBCUTANEOUS

## 2024-04-09 MED ORDER — SODIUM CHLORIDE 0.9 % IV SOLN
1.0000 g | Freq: Once | INTRAVENOUS | Status: AC
  Administered 2024-04-09: 1 g via INTRAVENOUS
  Filled 2024-04-09: qty 10

## 2024-04-09 MED ORDER — LACTATED RINGERS IV BOLUS (SEPSIS)
1000.0000 mL | Freq: Once | INTRAVENOUS | Status: AC
  Administered 2024-04-09: 1000 mL via INTRAVENOUS

## 2024-04-09 MED ORDER — SODIUM CHLORIDE 0.9 % IV SOLN
1.0000 g | INTRAVENOUS | Status: DC
  Administered 2024-04-10 – 2024-04-14 (×5): 1 g via INTRAVENOUS
  Filled 2024-04-09 (×5): qty 10

## 2024-04-09 MED ORDER — METOPROLOL SUCCINATE ER 50 MG PO TB24
50.0000 mg | ORAL_TABLET | Freq: Every day | ORAL | Status: DC
  Administered 2024-04-10 – 2024-04-14 (×5): 50 mg via ORAL
  Filled 2024-04-09 (×5): qty 1

## 2024-04-09 MED ORDER — ACETAMINOPHEN 650 MG RE SUPP: 650.0000 mg | Freq: Four times a day (QID) | RECTAL | Status: DC | PRN

## 2024-04-09 MED ORDER — SODIUM CHLORIDE 0.9% FLUSH
3.0000 mL | Freq: Two times a day (BID) | INTRAVENOUS | Status: DC
  Administered 2024-04-10 – 2024-04-14 (×6): 3 mL via INTRAVENOUS

## 2024-04-09 MED ORDER — ATORVASTATIN CALCIUM 40 MG PO TABS
40.0000 mg | ORAL_TABLET | Freq: Every day | ORAL | Status: DC
  Administered 2024-04-10 – 2024-04-14 (×5): 40 mg via ORAL
  Filled 2024-04-09 (×5): qty 1

## 2024-04-09 MED ORDER — POTASSIUM CHLORIDE 10 MEQ/100ML IV SOLN
10.0000 meq | INTRAVENOUS | Status: AC
  Administered 2024-04-09 (×3): 10 meq via INTRAVENOUS
  Filled 2024-04-09 (×3): qty 100

## 2024-04-09 MED ORDER — POTASSIUM CHLORIDE CRYS ER 20 MEQ PO TBCR
40.0000 meq | EXTENDED_RELEASE_TABLET | Freq: Once | ORAL | Status: AC
  Administered 2024-04-09: 40 meq via ORAL
  Filled 2024-04-09: qty 2

## 2024-04-09 MED ORDER — SODIUM CHLORIDE 0.9 % IV SOLN
100.0000 mg | Freq: Two times a day (BID) | INTRAVENOUS | Status: DC
  Administered 2024-04-09 – 2024-04-13 (×7): 100 mg via INTRAVENOUS
  Filled 2024-04-09 (×9): qty 100

## 2024-04-09 MED ORDER — ACETAMINOPHEN 325 MG PO TABS
650.0000 mg | ORAL_TABLET | Freq: Four times a day (QID) | ORAL | Status: DC | PRN
  Administered 2024-04-09 – 2024-04-12 (×6): 650 mg via ORAL
  Filled 2024-04-09 (×6): qty 2

## 2024-04-09 MED ORDER — POLYETHYLENE GLYCOL 3350 17 G PO PACK: 17.0000 g | PACK | Freq: Every day | ORAL | Status: DC | PRN

## 2024-04-09 MED ORDER — HEPARIN SODIUM (PORCINE) 5000 UNIT/ML IJ SOLN
5000.0000 [IU] | Freq: Three times a day (TID) | INTRAMUSCULAR | Status: DC
  Administered 2024-04-09 – 2024-04-14 (×16): 5000 [IU] via SUBCUTANEOUS
  Filled 2024-04-09 (×16): qty 1

## 2024-04-09 MED ORDER — LACTATED RINGERS IV BOLUS (SEPSIS): 1000.0000 mL | Freq: Once | INTRAVENOUS | Status: DC

## 2024-04-09 NOTE — ED Triage Notes (Signed)
 Pt BIB [facility] from home due to altered mental status that started 3 days ago.  Pt husband noticed wife was acting not like herself.  Pt became very confused today and unable to walk.  Pt currently saturated in urine.  18g left hand.  700ml NS given en route.   VS BP 180/72, HR 130, CBG 400

## 2024-04-09 NOTE — ED Notes (Signed)
 Patient transported to CT

## 2024-04-09 NOTE — Sepsis Progress Note (Signed)
 Sepsis protocol monitored by eLink ?

## 2024-04-09 NOTE — ED Notes (Signed)
 Phlebotomy to collect second set of cultures

## 2024-04-09 NOTE — ED Provider Notes (Signed)
 [place] EMERGENCY DEPARTMENT AT [facility] Provider Note   CSN: [id number] Arrival date & time: 04/09/24  1257     History  No chief complaint on file.   Felicia Shelton is a 83 y.o. female.  The history is provided by the patient, the spouse, the EMS personnel and medical records. No language interpreter was used.     83 year old female history of diabetes, hyperlipidemia, hypertension, neuromuscular disorder, GERD brought here via EMS from home with concerns of altered mental status.  History obtained the patient and through her husband who is at bedside.  For the past 3 days patient has been behaving more altered than her baseline.  She is confused, repeating words, having trouble following command, and today she was having difficulty getting up on her own according to husband.  When EMS arrived, patient was documented to be saturated in urine.  She was found to be tachycardic and she has an elevated CBG of 400.  Patient was given 700 cc of normal saline prior to arrival.  Currently patient is without any complaint.  History is limited as she is altered.  She does not endorse any active headache chest pain trouble breathing or urinary symptoms.  Home Medications Prior to Admission medications   Medication Sig Start Date End Date Taking? Authorizing Provider  acetaminophen  (TYLENOL ) 650 MG CR tablet Take 1,300 mg by mouth daily as needed for pain.    [provider]  amLODipine  (NORVASC ) 10 MG tablet Take 1 tablet by mouth once daily 10/13/23   Ganji, Jay, MD  atorvastatin  (LIPITOR) 40 MG tablet Take 40 mg by mouth daily. 04/08/22   [provider]  b complex vitamins capsule Take 1 capsule by mouth daily.    [provider]  Cholecalciferol  (VITAMIN D-3) 5000 units TABS Take 5,000 Units by mouth every Monday, Wednesday, and Friday.     [provider]  ezetimibe  (ZETIA ) 10 MG tablet Take 1 tablet (10 mg total) by mouth daily. 03/11/24 06/09/24   Knox Perl, MD  FARXIGA 10 MG TABS tablet Take 10 mg by mouth daily. 09/19/22   [provider]  fexofenadine-pseudoephedrine (ALLEGRA-D) 60-120 MG 12 hr tablet Take by mouth as needed.    [provider]  fluticasone  (FLONASE ) 50 MCG/ACT nasal spray     [provider]  gabapentin  (NEURONTIN ) 300 MG capsule Take 300 mg by mouth daily.    [provider]  Homeopathic Products [facility] RELIEF EX) Apply 1 application topically daily as needed (cramps).    [provider]  lisinopril  (ZESTRIL ) 20 MG tablet Take 1 tablet by mouth daily. 04/16/22   [provider]  metFORMIN  (GLUCOPHAGE ) 500 MG tablet Take 250 mg by mouth 2 (two) times daily. 10/22/20   [provider]  metoprolol  succinate (TOPROL -XL) 50 MG 24 hr tablet Take 1 tablet (50 mg total) by mouth daily. 03/11/24   Knox Perl, MD  Multiple Vitamins-Minerals (CENTRUM ADULT PO) Take by mouth.    [provider]  OVER THE COUNTER MEDICATION Apply 1 application topically daily as needed (pain). Pain relief cream with lidocaine     [provider]      Allergies    Patient has no known allergies.    Review of Systems   Review of Systems  Unable to perform ROS: Mental status change    Physical Exam Updated Vital Signs BP (!) 106/92 (BP Location: Left Arm)   Pulse (!) 132   Temp 98.2 F (  36.8 C) (Oral)   Resp 18   Ht 5\' 5"  (1.651 m)   Wt 77 kg   SpO2 97%   BMI 28.25 kg/m  Physical Exam Vitals and nursing note reviewed.  Constitutional:      General: She is not in acute distress.    Appearance: She is well-developed.     Comments: Patient appears altered but in no acute distress  HENT:     Head: Atraumatic.  Eyes:     Conjunctiva/sclera: Conjunctivae normal.  Cardiovascular:     Rate and Rhythm: Tachycardia present.     Pulses: Normal pulses.     Heart sounds: Normal heart sounds.  Pulmonary:     Effort: Pulmonary effort is normal.     Breath  sounds: No wheezing or rales.  Abdominal:     Palpations: Abdomen is soft.  Genitourinary:    Comments: She has urinary incontinence. Musculoskeletal:     Cervical back: Neck supple.     Comments: Does follow instruction and is able to move all 4 extremities  Skin:    Findings: No rash.  Neurological:     Mental Status: She is alert. She is disoriented.     GCS: GCS eye subscore is 4. GCS verbal subscore is 4. GCS motor subscore is 6.  Psychiatric:        Mood and Affect: Mood normal.     ED Results / Procedures / Treatments   Labs (all labs ordered are listed, but only abnormal results are displayed) Labs Reviewed  COMPREHENSIVE METABOLIC PANEL WITH GFR - Abnormal; Notable for the following components:      Result Value   Potassium 2.8 (*)    Chloride 97 (*)    Glucose, Bld 233 (*)    Creatinine, Ser 1.63 (*)    Calcium  8.8 (*)    Albumin 2.8 (*)    AST 48 (*)    GFR, Estimated 31 (*)    All other components within normal limits  CBC - Abnormal; Notable for the following components:   WBC 17.7 (*)    MCV 79.2 (*)    All other components within normal limits  URINALYSIS, ROUTINE W REFLEX MICROSCOPIC - Abnormal; Notable for the following components:   APPearance HAZY (*)    Glucose, UA >=500 (*)    Hgb urine dipstick MODERATE (*)    Ketones, ur 5 (*)    Protein, ur 100 (*)    Bacteria, UA RARE (*)    All other components within normal limits  CBG MONITORING, ED - Abnormal; Notable for the following components:   Glucose-Capillary 241 (*)    All other components within normal limits  I-STAT CG4 LACTIC ACID, ED - Abnormal; Notable for the following components:   Lactic Acid, Venous 2.0 (*)    All other components within normal limits  CULTURE, BLOOD (ROUTINE X 2)  CULTURE, BLOOD (ROUTINE X 2)  RAPID URINE DRUG SCREEN, HOSP PERFORMED  ETHANOL  MAGNESIUM  I-STAT VENOUS BLOOD GAS, ED  I-STAT CG4 LACTIC ACID, ED    EKG EKG Interpretation Date/Time:  Friday Apr 09 2024 14:04:37 EDT Ventricular Rate:  136 PR Interval:  143 QRS Duration:  68 QT Interval:  335 QTC Calculation: 504 R Axis:   27  Text Interpretation: Sinus tachycardia LAE, consider biatrial enlargement Borderline T abnormalities, diffuse leads No STEMI Confirmed by Abby Hocking [phone]) on 04/09/2024 2:11:04 PM  Radiology CT HEAD WO CONTRAST Result Date: 04/09/2024 CLINICAL DATA:  Altered  mental status, nontraumatic (Ped 0-17y) EXAM: CT HEAD WITHOUT CONTRAST TECHNIQUE: Contiguous axial images were obtained from the base of the skull through the vertex without intravenous contrast. RADIATION DOSE REDUCTION: This exam was performed according to the departmental dose-optimization program which includes automated exposure control, adjustment of the mA and/or kV according to patient size and/or use of iterative reconstruction technique. COMPARISON:  None Available. FINDINGS: Brain: Mildly motion limited study. Within this limitation, no evidence of acute large vascular territory infarct, acute hemorrhage, mass lesion, midline shift or hydrocephalus. Patchy white matter hypodensities most likely represent chronic microvascular ischemic change. Vascular: No hyperdense vessel. Skull: No acute fracture. Sinuses/Orbits: Clear sinuses.  No acute orbital findings. Other: No mastoid effusions. IMPRESSION: No evidence of acute intracranial abnormality. Electronically Signed   By: Stevenson Elbe M.D.   On: 04/09/2024 15:26   DG Chest Portable 1 View Result Date: 04/09/2024 CLINICAL DATA:  Sepsis. EXAM: PORTABLE CHEST 1 VIEW COMPARISON:  04/12/2021. FINDINGS: There are heterogeneous opacities overlying the right lower lung zone with air bronchogram and without volume loss small favoring pneumonia. Follow-up to clearing is recommended. Bilateral lung fields are otherwise clear. Bilateral costophrenic angles are clear. No significant pleural effusion or pneumothorax. Stable cardio-mediastinal silhouette. No acute  osseous abnormalities. The soft tissues are within normal limits. IMPRESSION: *Right lower lung zone pneumonia. Follow-up to clearing is recommended. Electronically Signed   By: Beula Brunswick M.D.   On: 04/09/2024 14:14    Procedures .Critical Care  Performed by: Debbra Fairy, PA-C Authorized by: Debbra Fairy, PA-C   Critical care provider statement:    Critical care time (minutes):  30   Critical care was time spent personally by me on the following activities:  Development of treatment plan with patient or surrogate, discussions with consultants, evaluation of patient's response to treatment, examination of patient, ordering and review of laboratory studies, ordering and review of radiographic studies, ordering and performing treatments and interventions, pulse oximetry, re-evaluation of patient's condition and review of old charts     Medications Ordered in ED Medications  potassium chloride  10 mEq in 100 mL IVPB (10 mEq Intravenous New Bag/Given 04/09/24 1456)  lactated ringers  infusion (has no administration in time range)  lactated ringers  bolus 1,000 mL (1,000 mLs Intravenous New Bag/Given 04/09/24 1532)    And  lactated ringers  bolus 500 mL (has no administration in time range)  sodium chloride  0.9 % bolus 1,000 mL (0 mLs Intravenous Stopped 04/09/24 1406)  cefTRIAXone (ROCEPHIN) 1 g in sodium chloride  0.9 % 100 mL IVPB (0 g Intravenous Stopped 04/09/24 1448)  doxycycline (VIBRA-TABS) tablet 100 mg (100 mg Oral Given 04/09/24 1456)  potassium chloride  SA (KLOR-CON  M) CR tablet 40 mEq (40 mEq Oral Given 04/09/24 1456)    ED Course/ Medical Decision Making/ A&P                                 Medical Decision Making Amount and/or Complexity of Data Reviewed Labs: ordered. Radiology: ordered.   BP (!) 185/76 (BP Location: Left Arm)   Pulse (!) 128   Resp (!) 30   Ht 5\' 5"  (1.651 m)   Wt 77 kg   SpO2 95%   BMI 28.25 kg/m   65:37 PM  83 year old female history of diabetes,  hyperlipidemia, hypertension, neuromuscular disorder, GERD brought here via EMS from home with concerns of altered mental status.  History obtained the patient and through her husband  who is at bedside.  For the past 3 days patient has been behaving more altered than her baseline.  She is confused, repeating words, having trouble following command, and today she was having difficulty getting up on her own according to husband.  When EMS arrived, patient was documented to be saturated in urine.  She was found to be tachycardic and she has an elevated CBG of 400.  Patient was given 700 cc of normal saline prior to arrival.  Currently patient is without any complaint.  History is limited as she is altered.  She does not endorse any active headache chest pain trouble breathing or urinary symptoms.  Exam notable for an elderly female laying in bed actively urinating and having some urinary incontinence.  She has confused verbal response but does follow command.  She is able to move all 4 extremities.  No signs of trauma.  Sensation seems to be intact.  -Labs ordered, independently viewed and interpreted by me.  Labs remarkable for elevated white count of 17.7.  Elevated lactic acid of 2.0.  Potassium of 2.8, will check mag level.  Creatinine of 1.63, IV fluid given -The patient was maintained on a cardiac monitor.  I personally viewed and interpreted the cardiac monitored which showed an underlying rhythm of: Sinus tachycardia -Imaging independently viewed and interpreted by me and I agree with radiologist's interpretation.  Result remarkable for chest x-ray shows right lower lung pneumonia.  Patient given Rocephin and doxycycline as treatment. -This patient presents to the ED for concern of altered mental status, this involves an extensive number of treatment options, and is a complaint that carries with it a high risk of complications and morbidity.  The differential diagnosis includes delirium, stroke,  metabolic derangement, depression, -Co morbidities that complicate the patient evaluation includes diabetes, hypertension, GERD -Treatment includes Rocephin, doxycycline, IV fluid, potassium -Reevaluation of the patient after these medicines showed that the patient improved -PCP office notes or outside notes reviewed -Discussion with specialist [facility] Dr. Rufina Cough who agrees to admit pt for sepsis in the setting of pneumonia -Escalation to admission/observation considered: pt agreeable with admission.          Final Clinical Impression(s) / ED Diagnoses Final diagnoses:  Severe sepsis (HCC)  Community acquired pneumonia of right lung, unspecified part of lung    Rx / DC Orders ED Discharge Orders     None         Debbra Fairy, PA-C 04/09/24 1542    Long, Shereen Dike, MD 04/12/24 [phone]

## 2024-04-09 NOTE — H&P (Addendum)
 History and Physical   Felicia Shelton DGL:[id number] DOB: 11/13/1941 DOA: 04/09/2024  PCP: Ransom Byers, MD   Patient coming from: Home  Chief Complaint: Altered mental status  HPI: Felicia Shelton is a 83 y.o. female with medical history significant of hypertension, hyperlipidemia, GERD, diabetes, CKD 3B presenting with worsening confusion.  History obtained with assistance of chart review and patient's family.  Patient has had some worsening intermittent confusion for the past several days.  Husband reports that he noticed her being confused and not following some commands.  Today she was even more severely confused and was unable to stand due to weakness.  EMS was called and patient received 700 cc IV fluids and route.  Patient now more alert now and denies chest pain, shortness of breath, abdominal pain. Still confused though.  ED Course: Vital signs in the ED notable for tachycardia in the 120s-130s, blood pressure in the 100s-180 systolic.  Lab workup included CMP with potassium 2.8, chloride 97, creatinine 1.63 near baseline of 1.4, glucose 233, calcium  8.8, albumin 2.8.  CBC with leukocytosis to 17.7.  Urinalysis with glucose, hemoglobin, ketones, protein, rare bacteria.  UDS negative.  Ethanol level negative.  VBG pending.  Blood cultures pending.  Lactic acid 2.0 with repeat pending.  Magnesium pending.  Patient received ceftriaxone, doxycycline, 2.5 L IV fluid and started on a rate of fluids, 40 mEq p.o. potassium and 30 mEq IV potassium.  Review of Systems: As per HPI otherwise all other systems reviewed and are negative.  Past Medical History:  Diagnosis Date   Allergy    Arthritis    back   Diabetes mellitus without complication (HCC)    type 2    GERD (gastroesophageal reflux disease)    Hx of adenomatous colonic polyp 05/26/2008   Hyperlipidemia    Hypertension    Neuromuscular disorder (HCC)     Past Surgical History:  Procedure Laterality Date   ABDOMINAL  HYSTERECTOMY     APPENDECTOMY     BREAST EXCISIONAL BIOPSY Left    BREAST SURGERY     left breast   CATARACT EXTRACTION Right 02/2020   COLONOSCOPY  10/11/2015   polyps 2009   JOINT REPLACEMENT     TONSILLECTOMY     removed age 64-5   TOTAL KNEE ARTHROPLASTY Left 04/17/2021   Procedure: LEFT TOTAL KNEE ARTHROPLASTY;  Surgeon: Dayne Even, MD;  Location: [facility];  Service: Orthopedics;  Laterality: Left;   TRANSFORAMINAL LUMBAR INTERBODY FUSION (TLIF) WITH PEDICLE SCREW FIXATION 2 LEVEL Right 12/31/2018   Procedure: RIGHT - SIDED LUMBAR 2-3,LUMBAR 3-4, LUMBAR 4-5 TRANSFORAMINAL LUMBAR INTERBODY FUSION WITH INSTRUMENTATION AND ALLOGRAFT;  Surgeon: Virl Grimes, MD;  Location: [facility];  Service: Orthopedics;  Laterality: Right;    Social History  reports that she quit smoking about 53 years ago. Her smoking use included cigarettes. She has never used smokeless tobacco. She reports that she does not drink alcohol and does not use drugs.  No Known Allergies  Family History  Adopted: Yes  Problem Relation Age of Onset   Colon cancer Maternal Aunt    Esophageal cancer Neg Hx    Rectal cancer Neg Hx    Stomach cancer Neg Hx   Reviewed on admission  Prior to Admission medications   Medication Sig Start Date End Date Taking? Authorizing Provider  acetaminophen  (TYLENOL ) 650 MG CR tablet Take 1,300 mg by mouth daily as needed for pain.    [provider]  amLODipine  (NORVASC ) 10  MG tablet Take 1 tablet by mouth once daily 10/13/23   Ganji, Jay, MD  atorvastatin  (LIPITOR) 40 MG tablet Take 40 mg by mouth daily. 04/08/22   [provider]  b complex vitamins capsule Take 1 capsule by mouth daily.    [provider]  Cholecalciferol  (VITAMIN D-3) 5000 units TABS Take 5,000 Units by mouth every Monday, Wednesday, and Friday.     [provider]  ezetimibe  (ZETIA ) 10 MG tablet Take 1 tablet (10 mg total) by mouth daily. 03/11/24 06/09/24  Knox Perl, MD   FARXIGA 10 MG TABS tablet Take 10 mg by mouth daily. 09/19/22   [provider]  fexofenadine-pseudoephedrine (ALLEGRA-D) 60-120 MG 12 hr tablet Take by mouth as needed.    [provider]  fluticasone  (FLONASE ) 50 MCG/ACT nasal spray     [provider]  gabapentin  (NEURONTIN ) 300 MG capsule Take 300 mg by mouth daily.    [provider]  Homeopathic Products [facility] RELIEF EX) Apply 1 application topically daily as needed (cramps).    [provider]  lisinopril  (ZESTRIL ) 20 MG tablet Take 1 tablet by mouth daily. 04/16/22   [provider]  metFORMIN  (GLUCOPHAGE ) 500 MG tablet Take 250 mg by mouth 2 (two) times daily. 10/22/20   [provider]  metoprolol  succinate (TOPROL -XL) 50 MG 24 hr tablet Take 1 tablet (50 mg total) by mouth daily. 03/11/24   Knox Perl, MD  Multiple Vitamins-Minerals (CENTRUM ADULT PO) Take by mouth.    [provider]  OVER THE COUNTER MEDICATION Apply 1 application topically daily as needed (pain). Pain relief cream with lidocaine     [provider]    Physical Exam: Vitals:   04/09/24 1306 04/09/24 1338 04/09/24 1415 04/09/24 1459  BP: (!) 185/76  (!) 166/80 (!) 106/92  Pulse: (!) 128  (!) 136 (!) 132  Resp: (!) 30  (!) 22 18  Temp:  98.2 F (36.8 C)  98.2 F (36.8 C)  TempSrc:  Oral  Oral  SpO2: 95%  94% 97%  Weight:      Height:        Physical Exam Constitutional:      General: She is not in acute distress.    Appearance: Normal appearance.  HENT:     Head: Normocephalic and atraumatic.     Mouth/Throat:     Mouth: Mucous membranes are moist.     Pharynx: Oropharynx is clear.  Eyes:     Extraocular Movements: Extraocular movements intact.     Pupils: Pupils are equal, round, and reactive to light.  Cardiovascular:     Rate and Rhythm: Regular rhythm. Tachycardia present.     Pulses: Normal pulses.     Heart sounds: Normal heart sounds.  Pulmonary:      Effort: No respiratory distress.     Breath sounds: Rales (RLL) present.     Comments: Mild increased work of breathing Abdominal:     General: Bowel sounds are normal. There is no distension.     Palpations: Abdomen is soft.     Tenderness: There is no abdominal tenderness.  Musculoskeletal:        General: No swelling or deformity.  Skin:    General: Skin is warm and dry.  Neurological:     General: No focal deficit present.     Mental Status: Mental status is at baseline.    Labs on Admission: I have personally reviewed following labs and imaging studies  CBC:  Recent Labs  Lab 04/09/24 1318  WBC 17.7*  HGB 12.8  HCT 36.5  MCV 79.2*  PLT 201    Basic Metabolic Panel: Recent Labs  Lab 04/09/24 1318  NA 135  K 2.8*  CL 97*  CO2 23  GLUCOSE 233*  BUN 16  CREATININE 1.63*  CALCIUM  8.8*    GFR: Estimated Creatinine Clearance: 27.8 mL/min (A) (by C-G formula based on SCr of 1.63 mg/dL (H)).  Liver Function Tests: Recent Labs  Lab 04/09/24 1318  AST 48*  ALT 27  ALKPHOS 59  BILITOT 1.1  PROT 6.9  ALBUMIN 2.8*    Urine analysis:    Component Value Date/Time   COLORURINE YELLOW 04/09/2024 1312   APPEARANCEUR HAZY (A) 04/09/2024 1312   LABSPEC 1.016 04/09/2024 1312   PHURINE 6.0 04/09/2024 1312   GLUCOSEU >=500 (A) 04/09/2024 1312   HGBUR MODERATE (A) 04/09/2024 1312   BILIRUBINUR NEGATIVE 04/09/2024 1312   KETONESUR 5 (A) 04/09/2024 1312   PROTEINUR 100 (A) 04/09/2024 1312   NITRITE NEGATIVE 04/09/2024 1312   LEUKOCYTESUR NEGATIVE 04/09/2024 1312    Radiological Exams on Admission: CT HEAD WO CONTRAST Result Date: 04/09/2024 CLINICAL DATA:  Altered mental status, nontraumatic (Ped 0-17y) EXAM: CT HEAD WITHOUT CONTRAST TECHNIQUE: Contiguous axial images were obtained from the base of the skull through the vertex without intravenous contrast. RADIATION DOSE REDUCTION: This exam was performed according to the departmental dose-optimization program  which includes automated exposure control, adjustment of the mA and/or kV according to patient size and/or use of iterative reconstruction technique. COMPARISON:  None Available. FINDINGS: Brain: Mildly motion limited study. Within this limitation, no evidence of acute large vascular territory infarct, acute hemorrhage, mass lesion, midline shift or hydrocephalus. Patchy white matter hypodensities most likely represent chronic microvascular ischemic change. Vascular: No hyperdense vessel. Skull: No acute fracture. Sinuses/Orbits: Clear sinuses.  No acute orbital findings. Other: No mastoid effusions. IMPRESSION: No evidence of acute intracranial abnormality. Electronically Signed   By: Stevenson Elbe M.D.   On: 04/09/2024 15:26   DG Chest Portable 1 View Result Date: 04/09/2024 CLINICAL DATA:  Sepsis. EXAM: PORTABLE CHEST 1 VIEW COMPARISON:  04/12/2021. FINDINGS: There are heterogeneous opacities overlying the right lower lung zone with air bronchogram and without volume loss small favoring pneumonia. Follow-up to clearing is recommended. Bilateral lung fields are otherwise clear. Bilateral costophrenic angles are clear. No significant pleural effusion or pneumothorax. Stable cardio-mediastinal silhouette. No acute osseous abnormalities. The soft tissues are within normal limits. IMPRESSION: *Right lower lung zone pneumonia. Follow-up to clearing is recommended. Electronically Signed   By: Beula Brunswick M.D.   On: 04/09/2024 14:14   EKG: Independently reviewed.  Sinus tachycardia at 136 bpm.  Nonspecific T wave changes.  Mild baseline wander.  QTc prolonged at 504.  Assessment/Plan Active Problems:   Hypertension   Diabetes mellitus (HCC)   Gastroesophageal reflux disease   Chronic kidney disease, stage 3b (HCC)   Pure hypercholesterolemia   Severe sepsis Pneumonia > Patient meeting severe sepsis criteria with tachycardia, leukocytosis 17.7, source of pneumonia on chest x-ray.  Also with  confusion has 1 organ system failure. > Has received 2 L IV fluids and started on a rate of fluids and is maintaining blood pressure at this time.  Initial lactic acid 2.0 with repeat pending. > Started on ceftriaxone and doxycycline in the setting of prolonged QTc. - Monitor in progressive unit - Continue with ceftriaxone and doxycycline - Trend fever curve and WBC - Follow-up blood  cultures - Trend lactic acid - Continue IV fluids - Supportive care  Hypokalemia CKD 3B > Noted to have potassium of 2.8.  Magnesium pending.  Creatinine mildly elevated but near baseline. > Received 40 mEq p.o. potassium and 30 mEq IV potassium. - Follow-up magnesium - Trend renal function and electrolytes  Hypertension - Holding amlodipine  and lisinopril  in the setting of sepsis - Continue metoprolol   Hyperlipidemia - Continue home atorvastatin  and Zetia   Diabetes - SSI   Prolonged QT > QTC 504 in ED - AM EKG - Avoid QT prolonging medications  DVT prophylaxis: Heparin  Code Status:   Full Family Communication:  Updated at bedside, Daughter requesting help with FMLA paperwork, will bring this in on 5/24.   Disposition Plan:   Patient is from:  Home  Anticipated DC to:  Home  Anticipated DC date:  2 to 4 days  Anticipated DC barriers: None  Consults called:  None Admission status:  Inpatient, progressive  Severity of Illness: The appropriate patient status for this patient is INPATIENT. Inpatient status is judged to be reasonable and necessary in order to provide the required intensity of service to ensure the patient's safety. The patient's presenting symptoms, physical exam findings, and initial radiographic and laboratory data in the context of their chronic comorbidities is felt to place them at high risk for further clinical deterioration. Furthermore, it is not anticipated that the patient will be medically stable for discharge from the hospital within 2 midnights of admission.   * I  certify that at the point of admission it is my clinical judgment that the patient will require inpatient hospital care spanning beyond 2 midnights from the point of admission due to high intensity of service, high risk for further deterioration and high frequency of surveillance required.Johnetta Nab MD [facility]  How to contact the [facility] Attending or Consulting provider 7A - 7P or covering provider during after hours 7P -7A, for this patient?   Check the care team in [facility] and look for a) attending/consulting [facility] provider listed and b) the [facility] team listed Log into [email or web address] and use [place]'s universal password to access. If you do not have the password, please contact the hospital operator. Locate the [facility] provider you are looking for under [facility] and page to a number that you can be directly reached. If you still have difficulty reaching the provider, please page the [facility] (Director on Call) for the Hospitalists listed on amion for assistance.  04/09/2024, 3:39 PM

## 2024-04-10 DIAGNOSIS — A419 Sepsis, unspecified organism: Secondary | ICD-10-CM

## 2024-04-10 DIAGNOSIS — R6520 Severe sepsis without septic shock: Secondary | ICD-10-CM

## 2024-04-10 LAB — COMPREHENSIVE METABOLIC PANEL WITH GFR
ALT: 26 U/L (ref 0–44)
AST: 46 U/L — ABNORMAL HIGH (ref 15–41)
Albumin: 1.6 g/dL — ABNORMAL LOW (ref 3.5–5.0)
Alkaline Phosphatase: 41 U/L (ref 38–126)
Anion gap: 14 (ref 5–15)
BUN: 9 mg/dL (ref 8–23)
CO2: 16 mmol/L — ABNORMAL LOW (ref 22–32)
Calcium: 7.5 mg/dL — ABNORMAL LOW (ref 8.9–10.3)
Chloride: 106 mmol/L (ref 98–111)
Creatinine, Ser: 0.85 mg/dL (ref 0.44–1.00)
GFR, Estimated: >60 mL/min (ref >60)
Glucose, Bld: 101 mg/dL — ABNORMAL HIGH (ref 70–99)
Potassium: 3.5 mmol/L (ref 3.5–5.1)
Sodium: 136 mmol/L (ref 135–145)
Total Bilirubin: 1 mg/dL (ref 0.0–1.2)
Total Protein: 4.4 g/dL — ABNORMAL LOW (ref 6.5–8.1)

## 2024-04-10 LAB — CBC
HCT: 27.2 % — ABNORMAL LOW (ref 36.0–46.0)
Hemoglobin: 9.1 g/dL — ABNORMAL LOW (ref 12.0–15.0)
MCH: 26.9 pg (ref 26.0–34.0)
MCHC: 33.5 g/dL (ref 30.0–36.0)
MCV: 80.5 fL (ref 80.0–100.0)
Platelets: 133 10*3/uL — ABNORMAL LOW (ref 150–400)
RBC: 3.38 MIL/uL — ABNORMAL LOW (ref 3.87–5.11)
RDW: 15 % (ref 11.5–15.5)
WBC: 11.1 10*3/uL — ABNORMAL HIGH (ref 4.0–10.5)
nRBC: 0 % (ref 0.0–0.2)

## 2024-04-10 LAB — GLUCOSE, CAPILLARY
Glucose-Capillary: 154 mg/dL — ABNORMAL HIGH (ref 70–99)
Glucose-Capillary: 192 mg/dL — ABNORMAL HIGH (ref 70–99)
Glucose-Capillary: 129 mg/dL — ABNORMAL HIGH (ref 70–99)
Glucose-Capillary: 176 mg/dL — ABNORMAL HIGH (ref 70–99)

## 2024-04-10 LAB — CORTISOL-AM, BLOOD: Cortisol - AM: 41.7 ug/dL — ABNORMAL HIGH (ref 6.7–22.6)

## 2024-04-10 LAB — PROTIME-INR
INR: 1.2 (ref 0.8–1.2)
Prothrombin Time: 14.9 s (ref 11.4–15.2)

## 2024-04-10 MED ORDER — METOPROLOL TARTRATE 5 MG/5ML IV SOLN
2.5000 mg | Freq: Four times a day (QID) | INTRAVENOUS | Status: DC | PRN
  Administered 2024-04-10 – 2024-04-11 (×2): 2.5 mg via INTRAVENOUS
  Filled 2024-04-10 (×2): qty 5

## 2024-04-10 NOTE — Plan of Care (Signed)
 Pt appears to be coping appropriately although still somewhat confused.

## 2024-04-10 NOTE — Plan of Care (Signed)
  Problem: Fluid Volume: Goal: Hemodynamic stability will improve Outcome: Progressing   Problem: Clinical Measurements: Goal: Diagnostic test results will improve Outcome: Progressing   Problem: Clinical Measurements: Goal: Signs and symptoms of infection will decrease Outcome: Progressing   Problem: Respiratory: Goal: Ability to maintain adequate ventilation will improve Outcome: Progressing   Problem: Education: Goal: Ability to describe self-care measures that may prevent or decrease complications (Diabetes Survival Skills Education) will improve Outcome: Progressing   Problem: Coping: Goal: Ability to adjust to condition or change in health will improve Outcome: Progressing

## 2024-04-10 NOTE — Progress Notes (Addendum)
 PROGRESS NOTE    Felicia Shelton  JXB:[id number] DOB: 18-Oct-1941 DOA: 04/09/2024 PCP: Ransom Byers, MD   Brief Narrative:  This 83 yrs old female with PMH significant of hypertension, hyperlipidemia, GERD, diabetes, CKD 3B presented in the ED with worsening confusion for last several days. Patient was very weak and was unable to stand due to confusion.  Workup in the ED reveals lactic acid 2.0,  Creatinine 1.63 near baseline,  Leukocytosis 17.7,  UA unremarkable.  UDS negative.  Chest x-ray shows possible infiltrate consistent with pneumonia. Patient was admitted for severe sepsis secondary to pneumonia.  Assessment & Plan:   Principal Problem:   Severe sepsis (HCC) Active Problems:   Hypertension   Diabetes mellitus (HCC)   Gastroesophageal reflux disease   Chronic kidney disease, stage 3b (HCC)   Pure hypercholesterolemia   Prolonged QT interval  Severe sepsis likely secondary to pneumonia: Patient presented with tachycardia, leukocytosis, infiltrate on chest x-ray,  confusion, lactic acid 2.0, meeting sepsis criteria. Has received 2 L of IV fluids, continue IV fluid resuscitation. Lactic acid improving with IV hydration. 2.0> 1.9 Continue empiric antibiotics ceftriaxone and doxycycline in the setting of prolonged QTc. Follow-up blood cultures, Continue IV fluid resuscitation and supportive care.  Acute metabolic encephalopathy likely due to sepsis: CT head unremarkable. Patient has made significant improvement with IV antibiotics and fluids. She is back to her baseline mental status.  Hypokalemia: Replaced, continue to monitor.   CKD 3B Serum Creatinine mildly elevated but near baseline. Renal functions back to normal.   Hypertension: Continue amlodipine  and lisinopril .  Continue metoprolol    Hyperlipidemia Continue home atorvastatin  and Zetia    Diabetes Mellitus II Continue SSI    Prolonged QT QTC 504 in ED Repeat EKG shows improved QTc. Avoid QT  prolonging medications.  Normochromic normocytic anemia; Hemoglobin dropped from 12.0->9.1 No obvious visible bleeding.  Could be Hemo dilutional. Monitor H&H.   DVT prophylaxis: Heparin  Code Status: Full code Family Communication: No family at bedside. Disposition Plan:    Status is: Inpatient Remains inpatient appropriate because: Severity of illness.    Consultants:  None  Procedures: None Antimicrobials:  Anti-infectives (From admission, onward)    Start     Dose/Rate Route Frequency Ordered Stop   04/10/24 1000  cefTRIAXone (ROCEPHIN) 1 g in sodium chloride  0.9 % 100 mL IVPB        1 g 200 mL/hr over 30 Minutes Intravenous Every 24 hours 04/09/24 1541     04/09/24 2200  doxycycline (VIBRAMYCIN) 100 mg in sodium chloride  0.9 % 250 mL IVPB        100 mg 125 mL/hr over 120 Minutes Intravenous Every 12 hours 04/09/24 1541     04/09/24 1445  doxycycline (VIBRA-TABS) tablet 100 mg        100 mg Oral  Once 04/09/24 1431 04/09/24 1456   04/09/24 1400  cefTRIAXone (ROCEPHIN) 1 g in sodium chloride  0.9 % 100 mL IVPB        1 g 200 mL/hr over 30 Minutes Intravenous  Once 04/09/24 1352 04/09/24 1448       Subjective: Patient was seen and examined at bedside. Overnight events noted. Patient reports feeling much improved.  She was sitting comfortably on the bed,  states she is a lot better.  Objective: Vitals:   04/09/24 2013 04/09/24 2340 04/10/24 0521 04/10/24 0720  BP: (!) 152/67 (!) 162/87 (!) 176/77 (!) 140/58  Pulse: (!) 119  (!) 131 (!) 59  Resp: (!) 32 Aaron Aas)  21 (!) 23 18  Temp: 100.2 F (37.9 C) 98.3 F (36.8 C) (!) 101.7 F (38.7 C) 97.9 F (36.6 C)  TempSrc: Oral Oral Oral Oral  SpO2: 91% 92% 93% 93%  Weight: 79.3 kg  79.3 kg   Height:        Intake/Output Summary (Last 24 hours) at 04/10/2024 1033 Last data filed at 04/10/2024 0800 Gross per 24 hour  Intake 940.13 ml  Output 1600 ml  Net -659.87 ml   Filed Weights   04/09/24 1304 04/09/24 2013  04/10/24 0521  Weight: 77 kg 79.3 kg 79.3 kg   Examination:  General exam: Appears calm and comfortable, not in any acute distress. Respiratory system: Clear to auscultation. Respiratory effort normal. RR 15 Cardiovascular system: S1 & S2 heard, RRR. No JVD, murmurs, rubs, gallops or clicks. Gastrointestinal system: Abdomen is non distended, soft and non tender. Normal bowel sounds heard. Central nervous system: Alert and oriented X 3. No focal neurological deficits. Extremities: No edema, no cyanosis, no clubbing Skin: No rashes, lesions or ulcers Psychiatry: Judgement and insight appear normal. Mood & affect appropriate.     Data Reviewed: I have personally reviewed following labs and imaging studies  CBC: Recent Labs  Lab 04/09/24 1318 04/09/24 1550 04/10/24 0342  WBC 17.7*  --  11.1*  HGB 12.8 12.2 9.1*  HCT 36.5 36.0 27.2*  MCV 79.2*  --  80.5  PLT 201  --  133*   Basic Metabolic Panel: Recent Labs  Lab 04/09/24 1318 04/09/24 1550 04/10/24 0342  NA 135 137 136  K 2.8* 2.9* 3.5  CL 97*  --  106  CO2 23  --  16*  GLUCOSE 233*  --  101*  BUN 16  --  9  CREATININE 1.63*  --  0.85  CALCIUM  8.8*  --  7.5*  MG 1.9  --   --    GFR: Estimated Creatinine Clearance: 54 mL/min (by C-G formula based on SCr of 0.85 mg/dL). Liver Function Tests: Recent Labs  Lab 04/09/24 1318 04/10/24 0342  AST 48* 46*  ALT 27 26  ALKPHOS 59 41  BILITOT 1.1 1.0  PROT 6.9 4.4*  ALBUMIN 2.8* 1.6*   No results for input(s): "LIPASE", "AMYLASE" in the last 168 hours. No results for input(s): "AMMONIA" in the last 168 hours. Coagulation Profile: Recent Labs  Lab 04/10/24 0342  INR 1.2   Cardiac Enzymes: No results for input(s): "CKTOTAL", "CKMB", "CKMBINDEX", "TROPONINI" in the last 168 hours. BNP (last 3 results) No results for input(s): "PROBNP" in the last 8760 hours. HbA1C: Recent Labs    04/09/24 2113  HGBA1C 6.3*   CBG: Recent Labs  Lab 04/09/24 1345  04/09/24 1759 04/09/24 2105 04/10/24 0556  GLUCAP 241* 194* 173* 154*   Lipid Profile: No results for input(s): "CHOL", "HDL", "LDLCALC", "TRIG", "CHOLHDL", "LDLDIRECT" in the last 72 hours. Thyroid Function Tests: No results for input(s): "TSH", "T4TOTAL", "FREET4", "T3FREE", "THYROIDAB" in the last 72 hours. Anemia Panel: No results for input(s): "VITAMINB12", "FOLATE", "FERRITIN", "TIBC", "IRON", "RETICCTPCT" in the last 72 hours. Sepsis Labs: Recent Labs  Lab 04/09/24 1327 04/09/24 1550  LATICACIDVEN 2.0* 1.9    Recent Results (from the past 240 hours)  Blood culture (routine x 2)     Status: None (Preliminary result)   Collection Time: 04/09/24  1:23 PM   Specimen: BLOOD  Result Value Ref Range Status   Specimen Description BLOOD SITE NOT SPECIFIED  Final   Special Requests  Final    BOTTLES DRAWN AEROBIC ONLY Blood Culture results may not be optimal due to an inadequate volume of blood received in culture bottles   Culture   Final    NO GROWTH < 24 HOURS Performed at [facility], [address], [place], [place] [address]    Report Status PENDING  Incomplete  Blood culture (routine x 2)     Status: None (Preliminary result)   Collection Time: 04/09/24  1:28 PM   Specimen: BLOOD  Result Value Ref Range Status   Specimen Description BLOOD RIGHT ANTECUBITAL  Final   Special Requests   Final    BOTTLES DRAWN AEROBIC AND ANAEROBIC Blood Culture adequate volume   Culture   Final    NO GROWTH < 24 HOURS Performed at [facility], [address], [place], [place] [address]    Report Status PENDING  Incomplete    Radiology Studies: CT HEAD WO CONTRAST Result Date: 04/09/2024 CLINICAL DATA:  Altered mental status, nontraumatic (Ped 0-17y) EXAM: CT HEAD WITHOUT CONTRAST TECHNIQUE: Contiguous axial images were obtained from the base of the skull through the vertex without intravenous contrast. RADIATION DOSE REDUCTION: This exam was performed according to the  departmental dose-optimization program which includes automated exposure control, adjustment of the mA and/or kV according to patient size and/or use of iterative reconstruction technique. COMPARISON:  None Available. FINDINGS: Brain: Mildly motion limited study. Within this limitation, no evidence of acute large vascular territory infarct, acute hemorrhage, mass lesion, midline shift or hydrocephalus. Patchy white matter hypodensities most likely represent chronic microvascular ischemic change. Vascular: No hyperdense vessel. Skull: No acute fracture. Sinuses/Orbits: Clear sinuses.  No acute orbital findings. Other: No mastoid effusions. IMPRESSION: No evidence of acute intracranial abnormality. Electronically Signed   By: Stevenson Elbe M.D.   On: 04/09/2024 15:26   DG Chest Portable 1 View Result Date: 04/09/2024 CLINICAL DATA:  Sepsis. EXAM: PORTABLE CHEST 1 VIEW COMPARISON:  04/12/2021. FINDINGS: There are heterogeneous opacities overlying the right lower lung zone with air bronchogram and without volume loss small favoring pneumonia. Follow-up to clearing is recommended. Bilateral lung fields are otherwise clear. Bilateral costophrenic angles are clear. No significant pleural effusion or pneumothorax. Stable cardio-mediastinal silhouette. No acute osseous abnormalities. The soft tissues are within normal limits. IMPRESSION: *Right lower lung zone pneumonia. Follow-up to clearing is recommended. Electronically Signed   By: Beula Brunswick M.D.   On: 04/09/2024 14:14   Scheduled Meds:  atorvastatin   40 mg Oral Daily   ezetimibe   10 mg Oral Daily   heparin   5,000 Units Subcutaneous Q8H   insulin  aspart  0-9 Units Subcutaneous TID WC   metoprolol  succinate  50 mg Oral Daily   sodium chloride  flush  3 mL Intravenous Q12H   Continuous Infusions:  cefTRIAXone (ROCEPHIN)  IV 1 g (04/10/24 0921)   doxycycline (VIBRAMYCIN) IV 100 mg (04/10/24 1003)   lactated ringers  150 mL/hr at 04/09/24 2213      LOS: 1 day    Time spent: 50 mins    Magdalene School, MD [facility]   If 7PM-7AM, please contact night-coverage

## 2024-04-11 DIAGNOSIS — R6520 Severe sepsis without septic shock: Secondary | ICD-10-CM | POA: Diagnosis not present

## 2024-04-11 DIAGNOSIS — A419 Sepsis, unspecified organism: Secondary | ICD-10-CM | POA: Diagnosis not present

## 2024-04-11 LAB — COMPREHENSIVE METABOLIC PANEL WITH GFR
ALT: 55 U/L — ABNORMAL HIGH (ref 0–44)
AST: 101 U/L — ABNORMAL HIGH (ref 15–41)
Albumin: 2.2 g/dL — ABNORMAL LOW (ref 3.5–5.0)
Alkaline Phosphatase: 66 U/L (ref 38–126)
Anion gap: 11 (ref 5–15)
BUN: 12 mg/dL (ref 8–23)
CO2: 23 mmol/L (ref 22–32)
Calcium: 8.5 mg/dL — ABNORMAL LOW (ref 8.9–10.3)
Chloride: 101 mmol/L (ref 98–111)
Creatinine, Ser: 1.16 mg/dL — ABNORMAL HIGH (ref 0.44–1.00)
GFR, Estimated: 47 mL/min — ABNORMAL LOW (ref >60)
Glucose, Bld: 160 mg/dL — ABNORMAL HIGH (ref 70–99)
Potassium: 3 mmol/L — ABNORMAL LOW (ref 3.5–5.1)
Sodium: 135 mmol/L (ref 135–145)
Total Bilirubin: 0.9 mg/dL (ref 0.0–1.2)
Total Protein: 6 g/dL — ABNORMAL LOW (ref 6.5–8.1)

## 2024-04-11 LAB — PHOSPHORUS: Phosphorus: 2.2 mg/dL — ABNORMAL LOW (ref 2.5–4.6)

## 2024-04-11 LAB — GLUCOSE, CAPILLARY
Glucose-Capillary: 160 mg/dL — ABNORMAL HIGH (ref 70–99)
Glucose-Capillary: 202 mg/dL — ABNORMAL HIGH (ref 70–99)
Glucose-Capillary: 196 mg/dL — ABNORMAL HIGH (ref 70–99)
Glucose-Capillary: 185 mg/dL — ABNORMAL HIGH (ref 70–99)

## 2024-04-11 LAB — CBC
HCT: 35.7 % — ABNORMAL LOW (ref 36.0–46.0)
Hemoglobin: 12.1 g/dL (ref 12.0–15.0)
MCH: 26.9 pg (ref 26.0–34.0)
MCHC: 33.9 g/dL (ref 30.0–36.0)
MCV: 79.3 fL — ABNORMAL LOW (ref 80.0–100.0)
Platelets: 205 10*3/uL (ref 150–400)
RBC: 4.5 MIL/uL (ref 3.87–5.11)
RDW: 14.9 % (ref 11.5–15.5)
WBC: 11.5 10*3/uL — ABNORMAL HIGH (ref 4.0–10.5)
nRBC: 0 % (ref 0.0–0.2)

## 2024-04-11 LAB — MAGNESIUM: Magnesium: 1.9 mg/dL (ref 1.7–2.4)

## 2024-04-11 MED ORDER — POTASSIUM PHOSPHATES 15 MMOLE/5ML IV SOLN
30.0000 mmol | Freq: Once | INTRAVENOUS | Status: AC
  Administered 2024-04-11: 30 mmol via INTRAVENOUS
  Filled 2024-04-11: qty 10

## 2024-04-11 NOTE — TOC Initial Note (Signed)
 Transition of Care [facility]) - Initial/Assessment Note    Patient Details  Name: Felicia Shelton MRN: [id number] Date of Birth: 07-Jan-1941  Transition of Care [facility]) CM/SW Contact:    Jarrell Merritts, LCSW Phone Number: 04/11/2024, 3:40 PM  Clinical Narrative:                  CSW met with patient,patient's spouse and patient's daughter -Abe Abed to discuss PT recommendation of a SNF. Patient was in and out therefore CSW mainly spoke with family in regards to SNF recommendation. Family was in agreement with patient going to a ST SNF. CSW explained that pt had originally been recommended for inpt rehab at [facility] however it was screened out.CSW discussed the SNF process.CSW provided Abe Abed with medicare.gov rating list. Abe Abed gave CSW permission to fax referrals out to local facilities.CSW answered questions about the SNF process and the next steps in the process.  Abe Abed explained that pt had been to [facility] previously and that was family's first choice. Abe Abed inquired about future steps if patient needed more care. CSW gently explained the placement are usually out of pocket expense. Abe Abed explained that her family would probably be the main caretakers as pt's husband is retired. Family was appreciative of CSW insight.  TOC team will continue to assist with discharge planning needs.   Expected Discharge Plan: Skilled Nursing Facility Barriers to Discharge: English as a second language teacher, Continued Medical Work up, SNF Pending bed offer   Patient Goals and CMS Choice   CMS Medicare.gov Compare Post Acute Care list provided to:: Patient Represenative (must comment) (Daughter- Abe Abed) Choice offered to / list presented to : Adult Children      Expected Discharge Plan and Services       Living arrangements for the past 2 months: Single Family Home                                      Prior Living Arrangements/Services Living arrangements for the past 2 months: Single Family Home Lives with::  Spouse Patient language and need for interpreter reviewed:: Yes          Care giver support system in place?: Yes (comment)      Activities of Daily Living   ADL Screening (condition at time of admission) Independently performs ADLs?: Yes (appropriate for developmental age) Is the patient deaf or have difficulty hearing?: No Does the patient have difficulty seeing, even when wearing glasses/contacts?: No Does the patient have difficulty concentrating, remembering, or making decisions?: Yes  Permission Sought/Granted      Share Information with NAME: Abe Abed- Daughter  Permission granted to share info w AGENCY: SNFs  Permission granted to share info w Relationship: Daughter  Permission granted to share info w Contact Information: [phone]  Emotional Assessment Appearance:: Appears stated age Attitude/Demeanor/Rapport: Unable to Assess Affect (typically observed): Unable to Assess Orientation: : Oriented to Self, Oriented to Place, Oriented to  Time, Oriented to Situation, Fluctuating Orientation (Suspected and/or reported Sundowners) (some confusion)      Admission diagnosis:  Severe sepsis (HCC) [A41.9, R65.20] Community acquired pneumonia of right lung, unspecified part of lung [J18.9] Patient Active Problem List   Diagnosis Date Noted   Chronic kidney disease, stage 3b (HCC) 04/09/2024   Pure hypercholesterolemia 04/09/2024   Severe sepsis (HCC) 04/09/2024   Prolonged QT interval 04/09/2024   Milia 01/28/2023   Vasomotor rhinitis 04/23/2022   Gastroesophageal reflux disease  02/21/2022   Diabetes mellitus (HCC) 05/26/2021   Primary osteoarthritis of left knee 04/17/2021   Primary localized osteoarthritis of left knee 04/17/2021   Radiculopathy 12/31/2018   Hypertension 08/04/2012   Hx of adenomatous colonic polyp 05/26/2008   PCP:  Ransom Byers, MD Pharmacy:   [id number] - [address] ([place]), [place] - [address] [place] ([place]) [place] [address] Phone: [phone] Fax: [phone]  [id number] - [place], [place] - [address] AT [facility] [address] [facility] [address] [place] [address] Phone: [phone] Fax: [phone]  [facility] [address] [place] [address] Phone: [phone] Fax: [phone]     Social Drivers of Health (SDOH) Social History: SDOH Screenings   Food Insecurity: No Food Insecurity (04/09/2024)  Housing: Low Risk  (04/09/2024)  Transportation Needs: No Transportation Needs (04/09/2024)  Utilities: Not At Risk (04/09/2024)  Depression (PHQ2-9): Low Risk  (01/21/2019)  Social Connections: Socially Integrated (04/09/2024)  Tobacco Use: Medium Risk (04/09/2024)   SDOH Interventions:     Readmission Risk Interventions     No data to display

## 2024-04-11 NOTE — Evaluation (Signed)
 Occupational Therapy Evaluation Patient Details Name: Felicia Shelton MRN: [id number] DOB: Mar 27, 1941 Today's Date: 04/11/2024   History of Present Illness   83 year old female admitted from home with concerns of altered mental status. Pt with sepsis likely secondary to pneumonia. PMH: diabetes, hyperlipidemia, hypertension, neuromuscular disorder, GERD.     Clinical Impressions PTA pt lives at home independently with her husband. Pt states she uses a RW in the community. Unable to clearly explain if she uses an AD in the house. Pt states she was driving and cooking simple meals, however had assistance with cleaning from an outside service. Pt demonstrates a significant functional decline, requiring mod A for ADL and mobility due to generalized weakness and cognitive deficits as noted below. Pt scored a 22/28 on the Short Blessed Test which indicates significant cognitive impairment. Discussed with nurse who states pt was able to ambulate with her but that she seems to "fluctuate". Given significant decline in functional status and high risk for falls, feel patient will benefit from intensive inpatient follow-up therapy, >3 hours/day to maximize functional level fo independence to facilitate a safe DC home with family. Acute OT to follow.      If plan is discharge home, recommend the following:   A lot of help with walking and/or transfers;A lot of help with bathing/dressing/bathroom;Assistance with cooking/housework;Direct supervision/assist for medications management;Direct supervision/assist for financial management;Assist for transportation;Help with stairs or ramp for entrance;Supervision due to cognitive status     Functional Status Assessment   Patient has had a recent decline in their functional status and demonstrates the ability to make significant improvements in function in a reasonable and predictable amount of time.     Equipment Recommendations   None recommended by OT      Recommendations for Other Services   PT consult     Precautions/Restrictions   Precautions Precautions: Fall     Mobility Bed Mobility Overal bed mobility: Needs Assistance Bed Mobility: Supine to Sit     Supine to sit: Mod assist     General bed mobility comments: without use of rails; continued to fall toward R; assit to scoot hips toward EOB    Transfers Overall transfer level: Needs assistance   Transfers: Sit to/from Stand Sit to Stand: Mod assist           General transfer comment: would stand less than 1 min before flopping back to bed; attempted x e; attempted to take steps however pt stated "I can't"      Balance Overall balance assessment: Needs assistance Sitting-balance support: Feet supported Sitting balance-Leahy Scale: Poor       Standing balance-Leahy Scale: Poor                             ADL either performed or assessed with clinical judgement   ADL Overall ADL's : Needs assistance/impaired Eating/Feeding: Set up   Grooming: Set up;Sitting   Upper Body Bathing: Minimal assistance;Sitting   Lower Body Bathing: Moderate assistance;Sit to/from stand   Upper Body Dressing : Minimal assistance   Lower Body Dressing: Moderate assistance;Sit to/from stand   Toilet Transfer: Moderate assistance   Toileting- Clothing Manipulation and Hygiene: Total assistance (incontinent)       Functional mobility during ADLs: Moderate assistance       Vision Baseline Vision/History: 1 Wears glasses Patient Visual Report:  (poor visual attention and tracking) Additional Comments: will further assess; able to read menu and see clock  Perception         Praxis         Pertinent Vitals/Pain Pain Assessment Pain Assessment: No/denies pain     Extremity/Trunk Assessment Upper Extremity Assessment Upper Extremity Assessment: Generalized weakness   Lower Extremity Assessment Lower Extremity Assessment: Defer to PT  evaluation   Cervical / Trunk Assessment Cervical / Trunk Assessment: Other exceptions (R bias)   Communication Communication Communication: No apparent difficulties   Cognition Arousal: Alert Behavior During Therapy: Restless; tangential Cognition: Cognition impaired   Orientation impairments: Time, Situation Awareness: Intellectual awareness impaired, Online awareness impaired Memory impairment (select all impairments): Short-term memory, Working [employer] fast streamer, Engineer, structural memory Attention impairment (select first level of impairment): Sustained attention Executive functioning impairment (select all impairments): Initiation, Organization, Reasoning, Problem solving OT - Cognition Comments: Scored a 22/28 on the Short Blessed Test which places her in the significantly impaired category. Not oriented to month (Feb then June 2), situation (trying to find her phone; , unable to count backwards or recite months of the year, ; thinks it is 3:30 pm and recalled 1/5 on delayed recall task. Thinnks she is in the hospital becasue she could not find her phone                 Following commands: Impaired Following commands impaired: Follows one step commands with increased time     Cueing  General Comments   Cueing Techniques: Verbal cues;Gestural cues;Tactile cues;Visual cues  increased SOB with activity   Exercises     Shoulder Instructions      Home Living Family/patient expects to be discharged to:: Private residence Living Arrangements: Spouse/significant other Available Help at Discharge: Available 24 hours/day Type of Home: House Home Access: Stairs to enter [employer] of Steps: 6 Entrance Stairs-Rails: Right;Left Home Layout: Multi-level Alternate Level Stairs-Number of Steps: 7 Alternate Level Stairs-Rails: Can reach both Bathroom Shower/Tub: Chief Strategy Officer: Handicapped height Bathroom Accessibility: Yes How Accessible:  Accessible via walker Home Equipment: Rolling Walker (2 wheels);Rollator (4 wheels);Grab bars - toilet;Grab bars - tub/shower;BSC/3in1;Shower seat          Prior Functioning/Environment Prior Level of Function : Independent/Modified Independent;Driving               ADLs Comments: Pays to have someone clean her house    OT Problem List: Decreased strength;Decreased activity tolerance;Impaired balance (sitting and/or standing);Decreased cognition;Decreased safety awareness   OT Treatment/Interventions: Self-care/ADL training;Therapeutic exercise;Energy conservation;DME and/or AE instruction;Therapeutic activities;Cognitive remediation/compensation;Patient/family education;Balance training      OT Goals(Current goals can be found in the care plan section)   Acute Rehab OT Goals Patient Stated Goal: to find her phone OT Goal Formulation: Patient unable to participate in goal setting Time For Goal Achievement: 04/25/24 Potential to Achieve Goals: Good   OT Frequency:  Min 2X/week    Co-evaluation              AM-PAC OT "6 Clicks" Daily Activity     Outcome Measure Help from another person eating meals?: A Little Help from another person taking care of personal grooming?: A Little Help from another person toileting, which includes using toliet, bedpan, or urinal?: A Lot Help from another person bathing (including washing, rinsing, drying)?: A Lot Help from another person to put on and taking off regular upper body clothing?: A Little Help from another person to put on and taking off regular lower body clothing?: A Lot 6 Click Score: 15   End of Session Equipment  Utilized During Treatment: Gait belt;Rolling walker (2 wheels) Nurse Communication: Mobility status  Activity Tolerance: Patient tolerated treatment well Patient left: in bed;with call bell/phone within reach;with bed alarm set  OT Visit Diagnosis: Unsteadiness on feet (R26.81);Other abnormalities of gait  and mobility (R26.89);Muscle weakness (generalized) (M62.81);Other symptoms and signs involving cognitive function                Time: 1000-1027 OT Time Calculation (min): 27 min Charges:  OT General Charges $OT Visit: 1 Visit OT Evaluation $OT Eval Moderate Complexity: 1 Mod OT Treatments $Self Care/Home Management : 8-22 mins  Milburn Aliment, OT/L   Acute OT Clinical Specialist Acute Rehabilitation Services Pager [phone] Office [phone]   [facility] 04/11/2024, 10:48 AM

## 2024-04-11 NOTE — Progress Notes (Signed)
 MEWS Progress Note  Patient Details Name: Felicia Shelton MRN: [id number] DOB: 1940-12-27 Today's Date: 04/11/2024   MEWS Flowsheet Documentation:  Assess: MEWS Score Temp: (!) 101.6 F (38.7 C) BP: (!) 162/70 MAP (mmHg): 95 Pulse Rate: (!) 109 ECG Heart Rate: (!) 109 Resp: (!) 22 Level of Consciousness: Alert SpO2: 95 % O2 Device: Room Air Assess: MEWS Score MEWS Temp: 2 MEWS Systolic: 0 MEWS Pulse: 2 MEWS RR: 2 MEWS LOC: 0 MEWS Score: 6 MEWS Score Color: Red Assess: SIRS CRITERIA SIRS Temperature : 1 SIRS Respirations : 1 SIRS Pulse: 1 SIRS WBC: 0 SIRS Score Sum : 3 SIRS Temperature : 1 SIRS Pulse: 1 SIRS Respirations : 1 SIRS WBC: 0 SIRS Score Sum : 3 Assess: if the MEWS score is Yellow or Red Were vital signs accurate and taken at a resting state?: Yes Does the patient meet 2 or more of the SIRS criteria?: Yes Does the patient have a confirmed or suspected source of infection?: Yes MEWS guidelines implemented : Yes, red Treat MEWS Interventions: Considered administering scheduled or prn medications/treatments as ordered Take Vital Signs Increase Vital Sign Frequency : Red: Q1hr x2, continue Q4hrs until patient remains green for 12hrs Escalate MEWS: Escalate: Red: Discuss with charge nurse and notify provider. Consider notifying RRT. If remains red for 2 hours consider need for higher level of care        Analena Gama A 04/11/2024, 5:42 PM

## 2024-04-11 NOTE — NC FL2 (Signed)
 [place]  MEDICAID FL2 LEVEL OF CARE FORM     IDENTIFICATION  Patient Name: RONALD VINSANT Birthdate: 10/14/1941 Sex: female Admission Date (Current Location): 04/09/2024  [facility] and [place] Number:  Producer, television/film/video and Address:  [facility] [place], [address], [place], [place] [address]      Provider Number: [id number]  Attending Physician Name and Address:  Magdalene School, MD  Relative Name and Phone Number:  Abe Abed [phone]    Current Level of Care: Hospital Recommended Level of Care: Skilled Nursing Facility Prior Approval Number:    Date Approved/Denied:   PASRR Number: [id number]  Discharge Plan: SNF    Current Diagnoses: Patient Active Problem List   Diagnosis Date Noted   Chronic kidney disease, stage 3b (HCC) 04/09/2024   Pure hypercholesterolemia 04/09/2024   Severe sepsis (HCC) 04/09/2024   Prolonged QT interval 04/09/2024   Milia 01/28/2023   Vasomotor rhinitis 04/23/2022   Gastroesophageal reflux disease 02/21/2022   Diabetes mellitus (HCC) 05/26/2021   Primary osteoarthritis of left knee 04/17/2021   Primary localized osteoarthritis of left knee 04/17/2021   Radiculopathy 12/31/2018   Hypertension 08/04/2012   Hx of adenomatous colonic polyp 05/26/2008    Orientation RESPIRATION BLADDER Height & Weight     Self, Time, Situation, Place (confusion)  Normal Incontinent Weight: 174 lb 2.6 oz (79 kg) Height:  5\' 5"  (165.1 cm)  BEHAVIORAL SYMPTOMS/MOOD NEUROLOGICAL BOWEL NUTRITION STATUS      Continent Diet (See DC summary)  AMBULATORY STATUS COMMUNICATION OF NEEDS Skin   Limited Assist Verbally Normal                       Personal Care Assistance Level of Assistance  Bathing, Feeding, Dressing Bathing Assistance: Limited assistance Feeding assistance: Independent Dressing Assistance: Limited assistance     Functional Limitations Info  Sight, Hearing, Speech Sight Info: Adequate Hearing Info:  Adequate Speech Info: Adequate    SPECIAL CARE FACTORS FREQUENCY  PT (By licensed PT), OT (By licensed OT)     PT Frequency: 5x per week OT Frequency: 5x per week            Contractures Contractures Info: Not present    Additional Factors Info  Code Status, Allergies Code Status Info: Full Allergies Info: NKA           Current Medications (04/11/2024):  This is the current hospital active medication list Current Facility-Administered Medications  Medication Dose Route Frequency Provider Last Rate Last Admin   acetaminophen  (TYLENOL ) tablet 650 mg  650 mg Oral Q6H PRN Johnetta Nab, MD   650 mg at 04/10/24 2244   Or   acetaminophen  (TYLENOL ) suppository 650 mg  650 mg Rectal Q6H PRN Johnetta Nab, MD       atorvastatin  (LIPITOR) tablet 40 mg  40 mg Oral Daily Melvin, Alexander B, MD   40 mg at 04/11/24 0916   cefTRIAXone (ROCEPHIN) 1 g in sodium chloride  0.9 % 100 mL IVPB  1 g Intravenous Q24H Johnetta Nab, MD   Stopped at 04/11/24 [phone]   doxycycline (VIBRAMYCIN) 100 mg in sodium chloride  0.9 % 250 mL IVPB  100 mg Intravenous Q12H Melvin, Alexander B, MD   Stopped at 04/11/24 1244   ezetimibe  (ZETIA ) tablet 10 mg  10 mg Oral Daily Melvin, Alexander B, MD   10 mg at 04/11/24 0916   heparin  injection 5,000 Units  5,000 Units Subcutaneous Q8H Sandy Crumb  B, MD   5,000 Units at 04/11/24 1434   insulin  aspart (novoLOG ) injection 0-9 Units  0-9 Units Subcutaneous TID WC Melvin, Alexander B, MD   3 Units at 04/11/24 1216   metoprolol  succinate (TOPROL -XL) 24 hr tablet 50 mg  50 mg Oral Daily Melvin, Alexander B, MD   50 mg at 04/11/24 0916   metoprolol  tartrate (LOPRESSOR ) injection 2.5 mg  2.5 mg Intravenous Q6H PRN Magdalene School, MD   2.5 mg at 04/11/24 1052   polyethylene glycol (MIRALAX / GLYCOLAX) packet 17 g  17 g Oral Daily PRN Johnetta Nab, MD       potassium PHOSPHATE 30 mmol in dextrose  5 % 500 mL infusion  30 mmol Intravenous Once Khatri,  Pardeep, MD 85 mL/hr at 04/11/24 1500 Infusion Verify at 04/11/24 1500   sodium chloride  flush (NS) 0.9 % injection 3 mL  3 mL Intravenous Q12H Johnetta Nab, MD   3 mL at 04/11/24 4098     Discharge Medications: Please see discharge summary for a list of discharge medications.  Relevant Imaging Results:  Relevant Lab Results:   Additional Information SSN# [id number]  Jarrell Merritts, LCSW

## 2024-04-11 NOTE — Progress Notes (Signed)
 PROGRESS NOTE    Felicia Shelton  UYQ:[id number] DOB: [birth year] DOA: 04/09/2024 PCP: Ransom Byers, MD   Brief Narrative:  This 83 yrs old female with PMH significant of hypertension, hyperlipidemia, GERD, diabetes, CKD 3B presented in the ED with worsening confusion for last several days. Patient was very weak and was unable to stand due to confusion.  Workup in the ED reveals lactic acid 2.0,  Creatinine 1.63 near baseline,  Leukocytosis 17.7,  UA unremarkable.  UDS negative.  Chest x-ray shows possible infiltrate consistent with pneumonia. Patient was admitted for severe sepsis secondary to pneumonia.  Assessment & Plan:   Principal Problem:   Severe sepsis (HCC) Active Problems:   Hypertension   Diabetes mellitus (HCC)   Gastroesophageal reflux disease   Chronic kidney disease, stage 3b (HCC)   Pure hypercholesterolemia   Prolonged QT interval  Severe sepsis likely secondary to pneumonia: Patient presented with tachycardia, leukocytosis, infiltrate on chest x-ray,  confusion, lactic acid 2.0, meeting sepsis criteria. Has received 2 L of IV fluids, continue IV fluid resuscitation. Lactic acid improving with IV hydration. 2.0> 1.9 Continue empiric antibiotics ceftriaxone and doxycycline in the setting of prolonged QTc. Follow-up blood cultures, Continue IV fluid resuscitation and supportive care.  Acute metabolic encephalopathy likely due to sepsis: CT head unremarkable. Patient has made significant improvement with IV antibiotics and fluids. She is back to her baseline mental status.  Hypokalemia: Replaced, continue to monitor.   CKD 3B Serum Creatinine mildly elevated but near baseline. Renal functions back to normal.   Hypertension: Continue amlodipine  and lisinopril .  Continue metoprolol .   Hyperlipidemia Continue home atorvastatin  and Zetia    Diabetes Mellitus II Continue SSI    Prolonged QT QTC 504 in ED Repeat EKG shows improved QTc. Avoid QT  prolonging medications.  Normochromic normocytic anemia; Hemoglobin dropped from 12.0->9.1 No obvious visible bleeding.  Could be Hemo dilutional. Monitor H&H.   DVT prophylaxis: Heparin  Code Status: Full code Family Communication: No family at bedside. Disposition Plan:    Status is: Inpatient Remains inpatient appropriate because: Severity of illness.    Consultants:  None  Procedures: None Antimicrobials:  Anti-infectives (From admission, onward)    Start     Dose/Rate Route Frequency Ordered Stop   04/10/24 1000  cefTRIAXone (ROCEPHIN) 1 g in sodium chloride  0.9 % 100 mL IVPB        1 g 200 mL/hr over 30 Minutes Intravenous Every 24 hours 04/09/24 1541     04/09/24 2200  doxycycline (VIBRAMYCIN) 100 mg in sodium chloride  0.9 % 250 mL IVPB        100 mg 125 mL/hr over 120 Minutes Intravenous Every 12 hours 04/09/24 1541     04/09/24 1445  doxycycline (VIBRA-TABS) tablet 100 mg        100 mg Oral  Once 04/09/24 1431 04/09/24 1456   04/09/24 1400  cefTRIAXone (ROCEPHIN) 1 g in sodium chloride  0.9 % 100 mL IVPB        1 g 200 mL/hr over 30 Minutes Intravenous  Once 04/09/24 1352 04/09/24 1448       Subjective: Patient was seen and examined at bedside. Overnight events noted. Patient reports feeling much improved.  She was sitting comfortably on the bed,  states she is a lot better. Patient has significant weakness requires a lot of support ambulating.  Objective: Vitals:   04/11/24 1020 04/11/24 1045 04/11/24 1110 04/11/24 1213  BP: (!) 173/82 (!) 164/77 (!) 181/77 (!) 147/87  Pulse:   Aaron Aas)  105   Resp: (!) 29 20 (!) 23 (!) 23  Temp:      TempSrc:      SpO2:   90% 92%  Weight:      Height:        Intake/Output Summary (Last 24 hours) at 04/11/2024 1232 Last data filed at 04/11/2024 1200 Gross per 24 hour  Intake 1905 ml  Output 2450 ml  Net -545 ml   Filed Weights   04/09/24 2013 04/10/24 0521 04/11/24 0449  Weight: 79.3 kg 79.3 kg 79 kg    Examination:  General exam: Appears calm and comfortable, not in any acute distress. Respiratory system: CTA Bilaterally . Respiratory effort normal. RR 15 Cardiovascular system: S1 & S2 heard, RRR. No JVD, murmurs, rubs, gallops or clicks. Gastrointestinal system: Abdomen is non distended, soft and non tender. Normal bowel sounds heard. Central nervous system: Alert and oriented X 3. No focal neurological deficits. Extremities: No edema, no cyanosis, no clubbing Skin: No rashes, lesions or ulcers Psychiatry: Judgement and insight appear normal. Mood & affect appropriate.     Data Reviewed: I have personally reviewed following labs and imaging studies  CBC: Recent Labs  Lab 04/09/24 1318 04/09/24 1550 04/10/24 0342 04/11/24 0255  WBC 17.7*  --  11.1* 11.5*  HGB 12.8 12.2 9.1* 12.1  HCT 36.5 36.0 27.2* 35.7*  MCV 79.2*  --  80.5 79.3*  PLT 201  --  133* 205   Basic Metabolic Panel: Recent Labs  Lab 04/09/24 1318 04/09/24 1550 04/10/24 0342 04/11/24 0255  NA 135 137 136 135  K 2.8* 2.9* 3.5 3.0*  CL 97*  --  106 101  CO2 23  --  16* 23  GLUCOSE 233*  --  101* 160*  BUN 16  --  9 12  CREATININE 1.63*  --  0.85 1.16*  CALCIUM  8.8*  --  7.5* 8.5*  MG 1.9  --   --  1.9  PHOS  --   --   --  2.2*   GFR: Estimated Creatinine Clearance: 39.5 mL/min (A) (by C-G formula based on SCr of 1.16 mg/dL (H)). Liver Function Tests: Recent Labs  Lab 04/09/24 1318 04/10/24 0342 04/11/24 0255  AST 48* 46* 101*  ALT 27 26 55*  ALKPHOS 59 41 66  BILITOT 1.1 1.0 0.9  PROT 6.9 4.4* 6.0*  ALBUMIN 2.8* 1.6* 2.2*   No results for input(s): "LIPASE", "AMYLASE" in the last 168 hours. No results for input(s): "AMMONIA" in the last 168 hours. Coagulation Profile: Recent Labs  Lab 04/10/24 0342  INR 1.2   Cardiac Enzymes: No results for input(s): "CKTOTAL", "CKMB", "CKMBINDEX", "TROPONINI" in the last 168 hours. BNP (last 3 results) No results for input(s): "PROBNP" in the  last 8760 hours. HbA1C: Recent Labs    04/09/24 2113  HGBA1C 6.3*   CBG: Recent Labs  Lab 04/10/24 1138 04/10/24 1604 04/10/24 2145 04/11/24 0610 04/11/24 1110  GLUCAP 192* 129* 176* 160* 202*   Lipid Profile: No results for input(s): "CHOL", "HDL", "LDLCALC", "TRIG", "CHOLHDL", "LDLDIRECT" in the last 72 hours. Thyroid Function Tests: No results for input(s): "TSH", "T4TOTAL", "FREET4", "T3FREE", "THYROIDAB" in the last 72 hours. Anemia Panel: No results for input(s): "VITAMINB12", "FOLATE", "FERRITIN", "TIBC", "IRON", "RETICCTPCT" in the last 72 hours. Sepsis Labs: Recent Labs  Lab 04/09/24 1327 04/09/24 1550  LATICACIDVEN 2.0* 1.9    Recent Results (from the past 240 hours)  Blood culture (routine x 2)     Status: None (Preliminary  result)   Collection Time: 04/09/24  1:23 PM   Specimen: BLOOD  Result Value Ref Range Status   Specimen Description BLOOD SITE NOT SPECIFIED  Final   Special Requests   Final    BOTTLES DRAWN AEROBIC ONLY Blood Culture results may not be optimal due to an inadequate volume of blood received in culture bottles   Culture   Final    NO GROWTH 2 DAYS Performed at [facility], [address] [place], [place] [address]    Report Status PENDING  Incomplete  Blood culture (routine x 2)     Status: None (Preliminary result)   Collection Time: 04/09/24  1:28 PM   Specimen: BLOOD  Result Value Ref Range Status   Specimen Description BLOOD RIGHT ANTECUBITAL  Final   Special Requests   Final    BOTTLES DRAWN AEROBIC AND ANAEROBIC Blood Culture adequate volume   Culture   Final    NO GROWTH 2 DAYS Performed at [facility], [address], [place], [place] [address]    Report Status PENDING  Incomplete    Radiology Studies: CT HEAD WO CONTRAST Result Date: 04/09/2024 CLINICAL DATA:  Altered mental status, nontraumatic (Ped 0-17y) EXAM: CT HEAD WITHOUT CONTRAST TECHNIQUE: Contiguous axial images were obtained from the base of  the skull through the vertex without intravenous contrast. RADIATION DOSE REDUCTION: This exam was performed according to the departmental dose-optimization program which includes automated exposure control, adjustment of the mA and/or kV according to patient size and/or use of iterative reconstruction technique. COMPARISON:  None Available. FINDINGS: Brain: Mildly motion limited study. Within this limitation, no evidence of acute large vascular territory infarct, acute hemorrhage, mass lesion, midline shift or hydrocephalus. Patchy white matter hypodensities most likely represent chronic microvascular ischemic change. Vascular: No hyperdense vessel. Skull: No acute fracture. Sinuses/Orbits: Clear sinuses.  No acute orbital findings. Other: No mastoid effusions. IMPRESSION: No evidence of acute intracranial abnormality. Electronically Signed   By: Stevenson Elbe M.D.   On: 04/09/2024 15:26   DG Chest Portable 1 View Result Date: 04/09/2024 CLINICAL DATA:  Sepsis. EXAM: PORTABLE CHEST 1 VIEW COMPARISON:  04/12/2021. FINDINGS: There are heterogeneous opacities overlying the right lower lung zone with air bronchogram and without volume loss small favoring pneumonia. Follow-up to clearing is recommended. Bilateral lung fields are otherwise clear. Bilateral costophrenic angles are clear. No significant pleural effusion or pneumothorax. Stable cardio-mediastinal silhouette. No acute osseous abnormalities. The soft tissues are within normal limits. IMPRESSION: *Right lower lung zone pneumonia. Follow-up to clearing is recommended. Electronically Signed   By: Beula Brunswick M.D.   On: 04/09/2024 14:14   Scheduled Meds:  atorvastatin   40 mg Oral Daily   ezetimibe   10 mg Oral Daily   heparin   5,000 Units Subcutaneous Q8H   insulin  aspart  0-9 Units Subcutaneous TID WC   metoprolol  succinate  50 mg Oral Daily   sodium chloride  flush  3 mL Intravenous Q12H   Continuous Infusions:  cefTRIAXone (ROCEPHIN)  IV  Stopped (04/11/24 0981)   doxycycline (VIBRAMYCIN) IV 125 mL/hr at 04/11/24 1200   potassium PHOSPHATE IVPB (in mmol)       LOS: 2 days    Time spent: 35 mins    Magdalene School, MD [facility]   If 7PM-7AM, please contact night-coverage

## 2024-04-11 NOTE — Evaluation (Signed)
 Physical Therapy Evaluation Patient Details Name: Felicia Shelton MRN: [id number] DOB: 18-Sep-1941 Today's Date: 04/11/2024  History of Present Illness  83 year old female admitted from home with concerns of altered mental status. Pt with sepsis likely secondary to pneumonia. PMH: diabetes, hyperlipidemia, hypertension, neuromuscular disorder, GERD.  Clinical Impression  Pt in bed upon arrival of PT, agreeable to evaluation at this time. Prior to admission the pt was mobilizing without DME at home, reports using RW in community and no recent falls. No family present during session to confirm information. Pt able to follow simple cues for bed mobility, and complete OOB transfers with modA and BUE support on RW. The pt completed ~12 ft ambulation with modA and close chair follow due to poor endurance and stability. Pt needing continuous support to advance RW, maintain balance, and cues for upright posture. Mobility and safety are further limited by impaired cognition, pt able to answer that she is at [facility], but seems to believe she is at an [facility]. She belives it to be 5PM and was perseverating on getting to the front desk to see if her husband was here to pick her up yet.  Given her inability to retain orientation information and significant assistance needed for mobility at this time, I recommend continued intensive therapies >3hours/day to facilitate recovery of independence prior to return home.     If plan is discharge home, recommend the following: A lot of help with walking and/or transfers;A lot of help with bathing/dressing/bathroom;Assistance with cooking/housework;Direct supervision/assist for medications management;Direct supervision/assist for financial management;Assist for transportation;Help with stairs or ramp for entrance;Supervision due to cognitive status   Can travel by private vehicle        Equipment Recommendations Wheelchair (measurements PT);Wheelchair cushion  (measurements PT)  Recommendations for Other Services       Functional Status Assessment Patient has had a recent decline in their functional status and demonstrates the ability to make significant improvements in function in a reasonable and predictable amount of time.     Precautions / Restrictions Precautions Precautions: Fall Recall of Precautions/Restrictions: Impaired Restrictions Weight Bearing Restrictions Per Provider Order: No      Mobility  Bed Mobility Overal bed mobility: Needs Assistance Bed Mobility: Supine to Sit     Supine to sit: Min assist, Used rails     General bed mobility comments: pt using rails, needed continued cues and then continued to fall toward R; assit to scoot hips toward EOB    Transfers Overall transfer level: Needs assistance Equipment used: Rolling walker (2 wheels) Transfers: Sit to/from Stand Sit to Stand: Mod assist           General transfer comment: modA to rise and cues for UE placement on RW.    Ambulation/Gait Ambulation/Gait assistance: Mod assist Gait Distance (Feet): 12 Feet Assistive device: Rolling walker (2 wheels) Gait Pattern/deviations: Step-through pattern, Decreased stride length, Trunk flexed, Knee flexed in stance - right, Knee flexed in stance - left Gait velocity: decreased     General Gait Details: pt with progressive trunk flexion and knees flexed. minimal stride length and clearance. fatigues quickly needing chair follow and modA to manage RW.      Balance Overall balance assessment: Needs assistance Sitting-balance support: Feet supported Sitting balance-Leahy Scale: Poor Sitting balance - Comments: falling to R Postural control: Posterior lean, Right lateral lean Standing balance support: Bilateral upper extremity supported, During functional activity, Reliant on assistive device for balance Standing balance-Leahy Scale: Poor Standing balance comment:  dependent on BUE support and external  assist                             Pertinent Vitals/Pain Pain Assessment Pain Assessment: No/denies pain    Home Living Family/patient expects to be discharged to:: Private residence Living Arrangements: Spouse/significant other Available Help at Discharge: Available 24 hours/day Type of Home: House Home Access: Stairs to enter Entrance Stairs-Rails: Doctor, general practice of Steps: 6 Alternate Level Stairs-Number of Steps: 7 Home Layout: Multi-level Home Equipment: Agricultural consultant (2 wheels);Rollator (4 wheels);Grab bars - toilet;Grab bars - tub/shower;BSC/3in1;Shower seat      Prior Function Prior Level of Function : Independent/Modified Independent;Driving             Mobility Comments: pt reports no use of RW, unclear but possibly attending gym or outpatient therapy "at [facility]" according to pt. ADLs Comments: Maritza Sidles to have someone clean her house     Extremity/Trunk Assessment   Upper Extremity Assessment Upper Extremity Assessment: Defer to OT evaluation;Generalized weakness    Lower Extremity Assessment Lower Extremity Assessment: Generalized weakness (grossly 4/5 to MMT. pt denies difference in sensation. limited endurance and power)    Cervical / Trunk Assessment Cervical / Trunk Assessment: Other exceptions Cervical / Trunk Exceptions: R bias  Communication   Communication Communication: No apparent difficulties    Cognition Arousal: Alert Behavior During Therapy: Restless   PT - Cognitive impairments: No family/caregiver present to determine baseline, Orientation, Awareness, Memory, Attention, Problem solving, Safety/Judgement   Orientation impairments: Place, Time, Situation                   PT - Cognition Comments: pt able to report back [facility] when asked where she was, but seems to believe she is at an [facility]. She belives it to be 5PM and was perseverating on getting to the front desk to see if her  husband was here to pick her up yet. pt able to follow simple commands with slight delay. poor insight to safety or deficits Following commands: Impaired Following commands impaired: Follows one step commands with increased time     Cueing Cueing Techniques: Verbal cues, Gestural cues, Tactile cues, Visual cues     General Comments General comments (skin integrity, edema, etc.): BP elevated, pt perseverating on meeting her husband.        Assessment/Plan    PT Assessment Patient needs continued PT services  PT Problem List Decreased strength;Decreased activity tolerance;Decreased balance;Decreased mobility;Decreased cognition;Decreased knowledge of use of DME;Decreased safety awareness;Decreased knowledge of precautions       PT Treatment Interventions DME instruction;Functional mobility training;Gait training;Stair training;Therapeutic activities;Therapeutic exercise;Balance training;Cognitive remediation;Patient/family education    PT Goals (Current goals can be found in the Care Plan section)  Acute Rehab PT Goals Patient Stated Goal: to find her spouse and go home PT Goal Formulation: With patient Time For Goal Achievement: 04/25/24 Potential to Achieve Goals: Good    Frequency Min 2X/week        AM-PAC PT "6 Clicks" Mobility  Outcome Measure Help needed turning from your back to your side while in a flat bed without using bedrails?: A Lot Help needed moving from lying on your back to sitting on the side of a flat bed without using bedrails?: A Lot Help needed moving to and from a bed to a chair (including a wheelchair)?: A Lot Help needed standing up from a chair using your arms (  e.g., wheelchair or bedside chair)?: A Lot Help needed to walk in hospital room?: Total (<20 ft) Help needed climbing 3-5 steps with a railing? : Total 6 Click Score: 10    End of Session Equipment Utilized During Treatment: Gait belt Activity Tolerance: Patient tolerated treatment  well Patient left: in chair;with call bell/phone within reach;with chair alarm set Nurse Communication: Mobility status PT Visit Diagnosis: Unsteadiness on feet (R26.81);Other abnormalities of gait and mobility (R26.89)    Time: 4098-1191 PT Time Calculation (min) (ACUTE ONLY): 27 min   Charges:   PT Evaluation $PT Eval Moderate Complexity: 1 Mod PT Treatments $Therapeutic Activity: 8-22 mins PT General Charges $$ ACUTE PT VISIT: 1 Visit         Barnabas Booth, PT, DPT   Acute Rehabilitation Department Office [phone] Secure Chat Communication Preferred  Lona Rist 04/11/2024, 1:11 PM

## 2024-04-11 NOTE — Progress Notes (Signed)
Inpatient Rehab Admissions Coordinator:   Per therapy recommendations, patient was screened for CIR candidacy by Bethann Qualley, MS, CCC-SLP. At this time, Pt. does not appear to demonstrate medical necessity to justify in hospital rehabilitation/CIR. will not pursue a rehab consult for this Pt.   Recommend other rehab venues to be pursued.  Please contact me with any questions.  Kerrilyn Azbill, MS, CCC-SLP Rehab Admissions Coordinator  [phone] (celll) [phone] (office)   

## 2024-04-12 DIAGNOSIS — A419 Sepsis, unspecified organism: Secondary | ICD-10-CM | POA: Diagnosis not present

## 2024-04-12 DIAGNOSIS — R6520 Severe sepsis without septic shock: Secondary | ICD-10-CM | POA: Diagnosis not present

## 2024-04-12 LAB — GLUCOSE, CAPILLARY
Glucose-Capillary: 157 mg/dL — ABNORMAL HIGH (ref 70–99)
Glucose-Capillary: 179 mg/dL — ABNORMAL HIGH (ref 70–99)
Glucose-Capillary: 152 mg/dL — ABNORMAL HIGH (ref 70–99)
Glucose-Capillary: 138 mg/dL — ABNORMAL HIGH (ref 70–99)

## 2024-04-12 LAB — PHOSPHORUS: Phosphorus: 2.6 mg/dL (ref 2.5–4.6)

## 2024-04-12 LAB — POTASSIUM: Potassium: 3.1 mmol/L — ABNORMAL LOW (ref 3.5–5.1)

## 2024-04-12 MED ORDER — POTASSIUM CHLORIDE CRYS ER 20 MEQ PO TBCR
40.0000 meq | EXTENDED_RELEASE_TABLET | Freq: Once | ORAL | Status: AC
  Administered 2024-04-12: 40 meq via ORAL
  Filled 2024-04-12: qty 2

## 2024-04-12 NOTE — Plan of Care (Signed)

## 2024-04-12 NOTE — Progress Notes (Signed)
 PROGRESS NOTE    Felicia Shelton  ZOX:[id number] DOB: 12-04-1940 DOA: 04/09/2024 PCP: Ransom Byers, MD   Brief Narrative:  This 83 yrs old female with PMH significant of hypertension, hyperlipidemia, GERD, diabetes, CKD 3B presented in the ED with worsening confusion for last several days. Patient was very weak and was unable to stand due to confusion.  Workup in the ED reveals lactic acid 2.0,  Creatinine 1.63 near baseline,  Leukocytosis 17.7,  UA unremarkable.  UDS negative.  Chest x-ray shows possible infiltrate consistent with pneumonia. Patient was admitted for severe sepsis secondary to pneumonia.  Assessment & Plan:   Principal Problem:   Severe sepsis (HCC) Active Problems:   Hypertension   Diabetes mellitus (HCC)   Gastroesophageal reflux disease   Chronic kidney disease, stage 3b (HCC)   Pure hypercholesterolemia   Prolonged QT interval  Severe sepsis likely secondary to pneumonia: Patient presented with tachycardia, leukocytosis, infiltrate on chest x-ray,  confusion, lactic acid 2.0, meeting sepsis criteria. Has received 2 L of IV fluids, continue IV fluid resuscitation. Lactic acid improved with IV hydration. 2.0> 1.9 Continue empiric antibiotics ceftriaxone and doxycycline in the setting of prolonged QTc. Blood cultures > No growth so far, Continue IV fluid resuscitation and supportive care. Sepsis physiology improving.  Acute metabolic encephalopathy likely due to sepsis: CT head unremarkable. Patient has made significant improvement with IV antibiotics and fluids. She is back to her baseline mental status.  Hypokalemia: Replaced, continue to monitor.   CKD 3B Serum Creatinine mildly elevated but near baseline. Renal functions back to Baseline.   Hypertension: Continue amlodipine  and lisinopril .  Continue metoprolol .   Hyperlipidemia Continue home atorvastatin  and Zetia .   Diabetes Mellitus II Continue SSI.    Prolonged QT QTC 504 in ED Repeat  EKG shows improved QTc. Avoid QT prolonging medications.  Normochromic normocytic anemia; Hemoglobin dropped from 12.0->9.1 No obvious visible bleeding.  Could be Hemo dilutional. Repeat H&H shows normal-hemoglobin 12.1   Generalized weakness: PT and OT recommended SNF.  DVT prophylaxis: Heparin  Code Status: Full code Family Communication: No family at bedside. Disposition Plan:    Status is: Inpatient Remains inpatient appropriate because: Severity of illness.    Consultants:  None  Procedures: None Antimicrobials:  Anti-infectives (From admission, onward)    Start     Dose/Rate Route Frequency Ordered Stop   04/10/24 1000  cefTRIAXone (ROCEPHIN) 1 g in sodium chloride  0.9 % 100 mL IVPB        1 g 200 mL/hr over 30 Minutes Intravenous Every 24 hours 04/09/24 1541     04/09/24 2200  doxycycline (VIBRAMYCIN) 100 mg in sodium chloride  0.9 % 250 mL IVPB        100 mg 125 mL/hr over 120 Minutes Intravenous Every 12 hours 04/09/24 1541     04/09/24 1445  doxycycline (VIBRA-TABS) tablet 100 mg        100 mg Oral  Once 04/09/24 1431 04/09/24 1456   04/09/24 1400  cefTRIAXone (ROCEPHIN) 1 g in sodium chloride  0.9 % 100 mL IVPB        1 g 200 mL/hr over 30 Minutes Intravenous  Once 04/09/24 1352 04/09/24 1448       Subjective: Patient was seen and examined at bedside. Overnight events noted. Patient reports feeling much improved. Patient has significant weakness requires a lot of support ambulating.  Objective: Vitals:   04/12/24 0418 04/12/24 0500 04/12/24 0740 04/12/24 1118  BP: (!) 173/77  (!) 154/73 (!) 145/91  Pulse: 99  100 97  Resp: 20 (!) 25 (!) 24 (!) 24  Temp: 98.2 F (36.8 C)  99.3 F (37.4 C) 98.5 F (36.9 C)  TempSrc: Oral  Oral Oral  SpO2: 94%  96% 94%  Weight: 83.3 kg     Height:        Intake/Output Summary (Last 24 hours) at 04/12/2024 1134 Last data filed at 04/12/2024 1122 Gross per 24 hour  Intake 1511.9 ml  Output 1525 ml  Net -13.1 ml    Filed Weights   04/10/24 0521 04/11/24 0449 04/12/24 0418  Weight: 79.3 kg 79 kg 83.3 kg   Examination:  General exam: Appears calm and comfortable, not in any acute distress. Respiratory system: CTA Bilaterally . Respiratory effort normal. RR 15 Cardiovascular system: S1 & S2 heard, RRR. No JVD, murmurs, rubs, gallops or clicks. Gastrointestinal system: Abdomen is non distended, soft and non tender. Normal bowel sounds heard. Central nervous system: Alert and oriented X 3. No focal neurological deficits. Extremities: No edema, no cyanosis, no clubbing Skin: No rashes, lesions or ulcers Psychiatry: Judgement and insight appear normal. Mood & affect appropriate.     Data Reviewed: I have personally reviewed following labs and imaging studies  CBC: Recent Labs  Lab 04/09/24 1318 04/09/24 1550 04/10/24 0342 04/11/24 0255  WBC 17.7*  --  11.1* 11.5*  HGB 12.8 12.2 9.1* 12.1  HCT 36.5 36.0 27.2* 35.7*  MCV 79.2*  --  80.5 79.3*  PLT 201  --  133* 205   Basic Metabolic Panel: Recent Labs  Lab 04/09/24 1318 04/09/24 1550 04/10/24 0342 04/11/24 0255  NA 135 137 136 135  K 2.8* 2.9* 3.5 3.0*  CL 97*  --  106 101  CO2 23  --  16* 23  GLUCOSE 233*  --  101* 160*  BUN 16  --  9 12  CREATININE 1.63*  --  0.85 1.16*  CALCIUM  8.8*  --  7.5* 8.5*  MG 1.9  --   --  1.9  PHOS  --   --   --  2.2*   GFR: Estimated Creatinine Clearance: 40.5 mL/min (A) (by C-G formula based on SCr of 1.16 mg/dL (H)). Liver Function Tests: Recent Labs  Lab 04/09/24 1318 04/10/24 0342 04/11/24 0255  AST 48* 46* 101*  ALT 27 26 55*  ALKPHOS 59 41 66  BILITOT 1.1 1.0 0.9  PROT 6.9 4.4* 6.0*  ALBUMIN 2.8* 1.6* 2.2*   No results for input(s): "LIPASE", "AMYLASE" in the last 168 hours. No results for input(s): "AMMONIA" in the last 168 hours. Coagulation Profile: Recent Labs  Lab 04/10/24 0342  INR 1.2   Cardiac Enzymes: No results for input(s): "CKTOTAL", "CKMB", "CKMBINDEX",  "TROPONINI" in the last 168 hours. BNP (last 3 results) No results for input(s): "PROBNP" in the last 8760 hours. HbA1C: Recent Labs    04/09/24 2113  HGBA1C 6.3*   CBG: Recent Labs  Lab 04/11/24 1110 04/11/24 1546 04/11/24 2121 04/12/24 0547 04/12/24 1120  GLUCAP 202* 196* 185* 157* 179*   Lipid Profile: No results for input(s): "CHOL", "HDL", "LDLCALC", "TRIG", "CHOLHDL", "LDLDIRECT" in the last 72 hours. Thyroid Function Tests: No results for input(s): "TSH", "T4TOTAL", "FREET4", "T3FREE", "THYROIDAB" in the last 72 hours. Anemia Panel: No results for input(s): "VITAMINB12", "FOLATE", "FERRITIN", "TIBC", "IRON", "RETICCTPCT" in the last 72 hours. Sepsis Labs: Recent Labs  Lab 04/09/24 1327 04/09/24 1550  LATICACIDVEN 2.0* 1.9    Recent Results (from the past 240 hours)  Blood culture (routine x 2)     Status: None (Preliminary result)   Collection Time: 04/09/24  1:23 PM   Specimen: BLOOD  Result Value Ref Range Status   Specimen Description BLOOD SITE NOT SPECIFIED  Final   Special Requests   Final    BOTTLES DRAWN AEROBIC ONLY Blood Culture results may not be optimal due to an inadequate volume of blood received in culture bottles   Culture   Final    NO GROWTH 3 DAYS Performed at [facility], [address] [place], [place] [address]    Report Status PENDING  Incomplete  Blood culture (routine x 2)     Status: None (Preliminary result)   Collection Time: 04/09/24  1:28 PM   Specimen: BLOOD  Result Value Ref Range Status   Specimen Description BLOOD RIGHT ANTECUBITAL  Final   Special Requests   Final    BOTTLES DRAWN AEROBIC AND ANAEROBIC Blood Culture adequate volume   Culture   Final    NO GROWTH 3 DAYS Performed at [facility], [address], [place], [place] [address]    Report Status PENDING  Incomplete    Radiology Studies: No results found.  Scheduled Meds:  atorvastatin   40 mg Oral Daily   ezetimibe   10 mg Oral Daily    heparin   5,000 Units Subcutaneous Q8H   insulin  aspart  0-9 Units Subcutaneous TID WC   metoprolol  succinate  50 mg Oral Daily   sodium chloride  flush  3 mL Intravenous Q12H   Continuous Infusions:  cefTRIAXone (ROCEPHIN)  IV 1 g (04/12/24 1009)   doxycycline (VIBRAMYCIN) IV 100 mg (04/12/24 1116)     LOS: 3 days    Time spent: 35 mins    Magdalene School, MD [facility]   If 7PM-7AM, please contact night-coverage

## 2024-04-12 NOTE — TOC Progression Note (Signed)
 Transition of Care [facility]) - Progression Note    Patient Details  Name: [name] MRN: [id number] Date of Birth: 1941-05-26  [facility]) CM/SW Contact  Arron Big, [place] Phone Number: 04/12/2024, 9:31 AM  Clinical Narrative:   CSW spoke with Abe Abed and informed her [facility] cannot accept patient at this time. Abe Abed stated she would like patient to go to [facility] and confirmed that with her farther. CSW explained insurance process and how many days insurance typically covers for SNF.   CSW to submit for auth when patient is closer to being medically ready.   TOC will continue to follow.    Expected Discharge Plan: Skilled Nursing Facility Barriers to Discharge: Continued Medical Work up, English as a second language teacher  Expected Discharge Plan and Services       Living arrangements for the past 2 months: Single Family Home                                       Social Determinants of Health (SDOH) Interventions SDOH Screenings   Food Insecurity: No Food Insecurity (04/09/2024)  Housing: Low Risk  (04/09/2024)  Transportation Needs: No Transportation Needs (04/09/2024)  Utilities: Not At Risk (04/09/2024)  Depression (PHQ2-9): Low Risk  (01/21/2019)  Social Connections: Socially Integrated (04/09/2024)  Tobacco Use: Medium Risk (04/09/2024)    Readmission Risk Interventions     No data to display

## 2024-04-13 DIAGNOSIS — R6520 Severe sepsis without septic shock: Secondary | ICD-10-CM | POA: Diagnosis not present

## 2024-04-13 DIAGNOSIS — A419 Sepsis, unspecified organism: Secondary | ICD-10-CM | POA: Diagnosis not present

## 2024-04-13 LAB — GLUCOSE, CAPILLARY
Glucose-Capillary: 159 mg/dL — ABNORMAL HIGH (ref 70–99)
Glucose-Capillary: 205 mg/dL — ABNORMAL HIGH (ref 70–99)
Glucose-Capillary: 161 mg/dL — ABNORMAL HIGH (ref 70–99)
Glucose-Capillary: 118 mg/dL — ABNORMAL HIGH (ref 70–99)

## 2024-04-13 MED ORDER — HYDRALAZINE HCL 20 MG/ML IJ SOLN: 10.0000 mg | Freq: Four times a day (QID) | INTRAMUSCULAR | Status: DC | PRN

## 2024-04-13 MED ORDER — DOXYCYCLINE HYCLATE 100 MG PO TABS
100.0000 mg | ORAL_TABLET | Freq: Two times a day (BID) | ORAL | Status: DC
  Administered 2024-04-13 – 2024-04-14 (×2): 100 mg via ORAL
  Filled 2024-04-13 (×2): qty 1

## 2024-04-13 MED ORDER — SODIUM CHLORIDE 0.9% FLUSH
10.0000 mL | Freq: Two times a day (BID) | INTRAVENOUS | Status: DC
  Administered 2024-04-14: 10 mL

## 2024-04-13 MED ORDER — SODIUM CHLORIDE 0.9% FLUSH: 10.0000 mL | INTRAVENOUS | Status: DC | PRN

## 2024-04-13 MED ORDER — HYDRALAZINE HCL 20 MG/ML IJ SOLN: 5.0000 mg | Freq: Four times a day (QID) | INTRAMUSCULAR | Status: DC | PRN

## 2024-04-13 MED ORDER — GUAIFENESIN-DM 100-10 MG/5ML PO SYRP
5.0000 mL | ORAL_SOLUTION | ORAL | Status: DC | PRN
  Administered 2024-04-13: 5 mL via ORAL
  Filled 2024-04-13: qty 5

## 2024-04-13 MED ORDER — POTASSIUM CHLORIDE 20 MEQ PO PACK
40.0000 meq | PACK | Freq: Once | ORAL | Status: AC
  Administered 2024-04-13: 40 meq via ORAL
  Filled 2024-04-13: qty 2

## 2024-04-13 NOTE — TOC Progression Note (Addendum)
 Transition of Care [facility]) - Progression Note    Patient Details  Name: Felicia Shelton MRN: [id number] Date of Birth: [birth year]  Transition of Care [facility]) CM/SW Contact  Arron Big, [place] Phone Number: 04/13/2024, 11:57 AM  Clinical Narrative:   Insurance authorization approved, approval dates 04/13/2024-04/15/2024; Auth id [id number].  MD and facility notified. CSW left VM for patients daughter, Abe Abed.   12:07 PM Abe Abed called CSW back and asked for patient to DC tomorrow. CSW notified MD.   TOC will continue to follow.    Expected Discharge Plan: Skilled Nursing Facility Barriers to Discharge: Continued Medical Work up, English as a second language teacher  Expected Discharge Plan and Services       Living arrangements for the past 2 months: Single Family Home                                       Social Determinants of Health (SDOH) Interventions SDOH Screenings   Food Insecurity: No Food Insecurity (04/09/2024)  Housing: Low Risk  (04/09/2024)  Transportation Needs: No Transportation Needs (04/09/2024)  Utilities: Not At Risk (04/09/2024)  Depression (PHQ2-9): Low Risk  (01/21/2019)  Social Connections: Socially Integrated (04/09/2024)  Tobacco Use: Medium Risk (04/09/2024)    Readmission Risk Interventions     No data to display

## 2024-04-13 NOTE — Progress Notes (Signed)

## 2024-04-13 NOTE — Progress Notes (Signed)
 PROGRESS NOTE    Felicia Shelton  OZH:[id number] DOB: [birth year] DOA: 04/09/2024 PCP: Ransom Byers, MD   Brief Narrative:  This 83 yrs old female with PMH significant of hypertension, hyperlipidemia, GERD, diabetes, CKD 3B presented in the ED with worsening confusion for last several days. Patient was very weak and was unable to stand due to confusion.  Workup in the ED reveals lactic acid 2.0,  Creatinine 1.63 near baseline,  Leukocytosis 17.7,  UA unremarkable.  UDS negative.  Chest x-ray shows possible infiltrate consistent with pneumonia. Patient was admitted for severe sepsis secondary to pneumonia.  Assessment & Plan:   Principal Problem:   Severe sepsis (HCC) Active Problems:   Hypertension   Diabetes mellitus (HCC)   Gastroesophageal reflux disease   Chronic kidney disease, stage 3b (HCC)   Pure hypercholesterolemia   Prolonged QT interval  Severe sepsis likely secondary to pneumonia: Patient presented with tachycardia, leukocytosis, infiltrate on chest x-ray,  confusion, lactic acid 2.0, meeting sepsis criteria. Has received 2 L of IV fluids, continue IV fluid resuscitation. Lactic acid improved with IV hydration. 2.0> 1.9 Continue empiric antibiotics ceftriaxone and doxycycline. Blood cultures > No growth so far,  Continue IV fluid resuscitation and supportive care. Sepsis physiology improving.  Acute metabolic encephalopathy likely due to sepsis: CT head unremarkable. Patient has made significant improvement with IV antibiotics and fluids. She is back to her baseline mental status.  Hypokalemia: Replaced, continue to monitor.   CKD 3B Serum Creatinine mildly elevated but near baseline. Renal functions back to Baseline.   Hypertension: Continue amlodipine  and lisinopril .  Continue metoprolol .   Hyperlipidemia Continue home atorvastatin  and Zetia .   Diabetes Mellitus II Continue SSI.    Prolonged QT QTC 504 in ED Repeat EKG shows improved  QTc. Avoid QT prolonging medications.  Normochromic normocytic anemia; Hemoglobin dropped from 12.0->9.1 No obvious visible bleeding.  Could be Hemo dilutional. Repeat H&H shows normal-hemoglobin 12.1   Generalized weakness: PT and OT recommended SNF.  DVT prophylaxis: Heparin  Code Status: Full code Family Communication: No family at bedside. Disposition Plan:    Status is: Inpatient Remains inpatient appropriate because: Admitted for sepsis secondary to community-acquired pneumonia now improving.   Patient is medically clear awaiting SNF insurance authorization.  Consultants:  None  Procedures: None Antimicrobials:  Anti-infectives (From admission, onward)    Start     Dose/Rate Route Frequency Ordered Stop   04/10/24 1000  cefTRIAXone (ROCEPHIN) 1 g in sodium chloride  0.9 % 100 mL IVPB        1 g 200 mL/hr over 30 Minutes Intravenous Every 24 hours 04/09/24 1541     04/09/24 2200  doxycycline (VIBRAMYCIN) 100 mg in sodium chloride  0.9 % 250 mL IVPB        100 mg 125 mL/hr over 120 Minutes Intravenous Every 12 hours 04/09/24 1541     04/09/24 1445  doxycycline (VIBRA-TABS) tablet 100 mg        100 mg Oral  Once 04/09/24 1431 04/09/24 1456   04/09/24 1400  cefTRIAXone (ROCEPHIN) 1 g in sodium chloride  0.9 % 100 mL IVPB        1 g 200 mL/hr over 30 Minutes Intravenous  Once 04/09/24 1352 04/09/24 1448       Subjective: Patient was seen and examined at bedside. Overnight events noted. Patient reports feeling very much improved. She is medically clear now, awaiting insurance authorization for SNF placement  Objective: Vitals:   04/13/24 0342 04/13/24 0359 04/13/24 0740 04/13/24  1124  BP: (!) 153/79  (!) 159/78 (!) 147/73  Pulse: 89  93 89  Resp: 17  20 20   Temp: 98.6 F (37 C)  97.8 F (36.6 C) 97.8 F (36.6 C)  TempSrc: Oral  Oral Oral  SpO2: 94%  95% 96%  Weight:  75.5 kg    Height:        Intake/Output Summary (Last 24 hours) at 04/13/2024 1204 Last  data filed at 04/13/2024 1124 Gross per 24 hour  Intake 941.95 ml  Output 2600 ml  Net -1658.05 ml   Filed Weights   04/11/24 0449 04/12/24 0418 04/13/24 0359  Weight: 79 kg 83.3 kg 75.5 kg   Examination:  General exam: Appears calm and comfortable, not in any acute distress. Respiratory system: CTA Bilaterally . Respiratory effort normal. RR 14 Cardiovascular system: S1 & S2 heard, RRR. No JVD, murmurs, rubs, gallops or clicks. Gastrointestinal system: Abdomen is non distended, soft and non tender. Normal bowel sounds heard. Central nervous system: Alert and oriented X 3. No focal neurological deficits. Extremities: No edema, no cyanosis, no clubbing Skin: No rashes, lesions or ulcers Psychiatry: Judgement and insight appear normal. Mood & affect appropriate.     Data Reviewed: I have personally reviewed following labs and imaging studies  CBC: Recent Labs  Lab 04/09/24 1318 04/09/24 1550 04/10/24 0342 04/11/24 0255  WBC 17.7*  --  11.1* 11.5*  HGB 12.8 12.2 9.1* 12.1  HCT 36.5 36.0 27.2* 35.7*  MCV 79.2*  --  80.5 79.3*  PLT 201  --  133* 205   Basic Metabolic Panel: Recent Labs  Lab 04/09/24 1318 04/09/24 1550 04/10/24 0342 04/11/24 0255 04/12/24 1116  NA 135 137 136 135  --   K 2.8* 2.9* 3.5 3.0* 3.1*  CL 97*  --  106 101  --   CO2 23  --  16* 23  --   GLUCOSE 233*  --  101* 160*  --   BUN 16  --  9 12  --   CREATININE 1.63*  --  0.85 1.16*  --   CALCIUM  8.8*  --  7.5* 8.5*  --   MG 1.9  --   --  1.9  --   PHOS  --   --   --  2.2* 2.6   GFR: Estimated Creatinine Clearance: 38.7 mL/min (A) (by C-G formula based on SCr of 1.16 mg/dL (H)). Liver Function Tests: Recent Labs  Lab 04/09/24 1318 04/10/24 0342 04/11/24 0255  AST 48* 46* 101*  ALT 27 26 55*  ALKPHOS 59 41 66  BILITOT 1.1 1.0 0.9  PROT 6.9 4.4* 6.0*  ALBUMIN 2.8* 1.6* 2.2*   No results for input(s): "LIPASE", "AMYLASE" in the last 168 hours. No results for input(s): "AMMONIA" in the  last 168 hours. Coagulation Profile: Recent Labs  Lab 04/10/24 0342  INR 1.2   Cardiac Enzymes: No results for input(s): "CKTOTAL", "CKMB", "CKMBINDEX", "TROPONINI" in the last 168 hours. BNP (last 3 results) No results for input(s): "PROBNP" in the last 8760 hours. HbA1C: No results for input(s): "HGBA1C" in the last 72 hours.  CBG: Recent Labs  Lab 04/12/24 1120 04/12/24 1552 04/12/24 2059 04/13/24 0607 04/13/24 1124  GLUCAP 179* 152* 138* 159* 205*   Lipid Profile: No results for input(s): "CHOL", "HDL", "LDLCALC", "TRIG", "CHOLHDL", "LDLDIRECT" in the last 72 hours. Thyroid Function Tests: No results for input(s): "TSH", "T4TOTAL", "FREET4", "T3FREE", "THYROIDAB" in the last 72 hours. Anemia Panel: No results for input(s): "  VITAMINB12", "FOLATE", "FERRITIN", "TIBC", "IRON", "RETICCTPCT" in the last 72 hours. Sepsis Labs: Recent Labs  Lab 04/09/24 1327 04/09/24 1550  LATICACIDVEN 2.0* 1.9    Recent Results (from the past 240 hours)  Blood culture (routine x 2)     Status: None (Preliminary result)   Collection Time: 04/09/24  1:23 PM   Specimen: BLOOD  Result Value Ref Range Status   Specimen Description BLOOD SITE NOT SPECIFIED  Final   Special Requests   Final    BOTTLES DRAWN AEROBIC ONLY Blood Culture results may not be optimal due to an inadequate volume of blood received in culture bottles   Culture   Final    NO GROWTH 4 DAYS Performed at [facility], [address], [place], [place] [address]    Report Status PENDING  Incomplete  Blood culture (routine x 2)     Status: None (Preliminary result)   Collection Time: 04/09/24  1:28 PM   Specimen: BLOOD  Result Value Ref Range Status   Specimen Description BLOOD RIGHT ANTECUBITAL  Final   Special Requests   Final    BOTTLES DRAWN AEROBIC AND ANAEROBIC Blood Culture adequate volume   Culture   Final    NO GROWTH 4 DAYS Performed at [facility] [facility], [address] [place], [place] [address]     Report Status PENDING  Incomplete    Radiology Studies: No results found.  Scheduled Meds:  atorvastatin   40 mg Oral Daily   ezetimibe   10 mg Oral Daily   heparin   5,000 Units Subcutaneous Q8H   insulin  aspart  0-9 Units Subcutaneous TID WC   metoprolol  succinate  50 mg Oral Daily   sodium chloride  flush  3 mL Intravenous Q12H   Continuous Infusions:  cefTRIAXone (ROCEPHIN)  IV 1 g (04/13/24 0902)   doxycycline (VIBRAMYCIN) IV 100 mg (04/13/24 0943)     LOS: 4 days    Time spent: 35 mins    Magdalene School, MD [facility]   If 7PM-7AM, please contact night-coverage

## 2024-04-13 NOTE — Plan of Care (Signed)

## 2024-04-13 NOTE — Progress Notes (Signed)
 Physical Therapy Treatment Patient Details Name: Felicia Shelton MRN: [id number] DOB: 07-Oct-1941 Today's Date: 04/13/2024   History of Present Illness 83 year old female admitted from home with concerns of altered mental status. Pt with sepsis likely secondary to pneumonia. PMH: diabetes, hyperlipidemia, hypertension, neuromuscular disorder, GERD.    PT Comments  Worked on multiple sit to stands for hygiene but with benefit of LE strengthening and cues for safe technique.  Patient with one LOB attempting to return to supine needing CGA to prevent fall off EOB.  Noted pt with incontinent BM though she reports had called for help to use the bathroom.  Family arrived and with multiple questions about when d/c home for using BSC, briefs, bed pads, etc.  PT will continue to follow during this acute stay.     If plan is discharge home, recommend the following: A lot of help with walking and/or transfers;A lot of help with bathing/dressing/bathroom;Assistance with cooking/housework;Direct supervision/assist for medications management;Direct supervision/assist for financial management;Assist for transportation;Help with stairs or ramp for entrance;Supervision due to cognitive status   Can travel by private vehicle     No  Equipment Recommendations  Wheelchair (measurements PT);Wheelchair cushion (measurements PT)    Recommendations for Other Services       Precautions / Restrictions Precautions Precautions: Fall Recall of Precautions/Restrictions: Intact Restrictions Weight Bearing Restrictions Per Provider Order: No     Mobility  Bed Mobility Overal bed mobility: Needs Assistance Bed Mobility: Supine to Sit     Supine to sit: Min assist, HOB elevated     General bed mobility comments: reaching for hand hold assist to sit up    Transfers Overall transfer level: Needs assistance Equipment used: Rolling walker (2 wheels) Transfers: Sit to/from Stand Sit to Stand: Mod assist            General transfer comment: multiple times up and down with cues for hand placement and some lifting assist    Ambulation/Gait Ambulation/Gait assistance: Min assist, +2 safety/equipment Gait Distance (Feet): 60 Feet Assistive device: Rolling walker (2 wheels) Gait Pattern/deviations: Step-to pattern, Step-through pattern, Decreased stride length       General Gait Details: unsure of herself, and noted HR elevation to 123 though SpO2 90% or greater on RA, daughter staying beside her as well with ambulation   Stairs             Wheelchair Mobility     Tilt Bed    Modified Rankin (Stroke Patients Only)       Balance Overall balance assessment: Needs assistance Sitting-balance support: Feet supported Sitting balance-Leahy Scale: Good Sitting balance - Comments: on EOB no LOB except when reaching to return to supine and CGA for safety with quick movement   Standing balance support: Bilateral upper extremity supported Standing balance-Leahy Scale: Poor Standing balance comment: UE support and min A for balance                            Communication Communication Communication: No apparent difficulties  Cognition Arousal: Alert Behavior During Therapy: WFL for tasks assessed/performed   PT - Cognitive impairments: Problem solving, Safety/Judgement                       PT - Cognition Comments: unaware she had a bowel  movement, some impulsive movements at times Following commands: Intact      Cueing Cueing Techniques: Verbal cues  Exercises  General Comments General comments (skin integrity, edema, etc.): daughter and spouse arrived during session, notes she is to be transferred to rehab tomorrow.  Pt soiled with BM, states she had called out but unaware she was incontinent.  Assisted for hygiene, removing briefs and pt on EOB placed briefs from home with min/mod A      Pertinent Vitals/Pain Pain Assessment Pain Assessment:  No/denies pain    Home Living                          Prior Function            PT Goals (current goals can now be found in the care plan section) Progress towards PT goals: Progressing toward goals    Frequency    Min 2X/week      PT Plan      Co-evaluation              AM-PAC PT "6 Clicks" Mobility   Outcome Measure  Help needed turning from your back to your side while in a flat bed without using bedrails?: A Little Help needed moving from lying on your back to sitting on the side of a flat bed without using bedrails?: A Little Help needed moving to and from a bed to a chair (including a wheelchair)?: A Lot Help needed standing up from a chair using your arms (e.g., wheelchair or bedside chair)?: A Lot Help needed to walk in hospital room?: A Lot Help needed climbing 3-5 steps with a railing? : Total 6 Click Score: 13    End of Session Equipment Utilized During Treatment: Gait belt Activity Tolerance: Patient tolerated treatment well Patient left: in bed;with call bell/phone within reach;with family/visitor present (seated on EOB)   PT Visit Diagnosis: Unsteadiness on feet (R26.81);Other abnormalities of gait and mobility (R26.89)     Time: 0981-1914 PT Time Calculation (min) (ACUTE ONLY): 47 min  Charges:    $Gait Training: 8-22 mins $Therapeutic Activity: 23-37 mins PT General Charges $$ ACUTE PT VISIT: 1 Visit                     Abigail Hoff, PT Acute Rehabilitation Services Office:[phone] 04/13/2024    Marley Simmers 04/13/2024, 5:10 PM

## 2024-04-14 DIAGNOSIS — R2689 Other abnormalities of gait and mobility: Secondary | ICD-10-CM | POA: Diagnosis not present

## 2024-04-14 DIAGNOSIS — J189 Pneumonia, unspecified organism: Secondary | ICD-10-CM

## 2024-04-14 DIAGNOSIS — N189 Chronic kidney disease, unspecified: Secondary | ICD-10-CM | POA: Diagnosis not present

## 2024-04-14 DIAGNOSIS — I10 Essential (primary) hypertension: Secondary | ICD-10-CM | POA: Diagnosis not present

## 2024-04-14 DIAGNOSIS — E785 Hyperlipidemia, unspecified: Secondary | ICD-10-CM | POA: Diagnosis not present

## 2024-04-14 DIAGNOSIS — M6281 Muscle weakness (generalized): Secondary | ICD-10-CM | POA: Diagnosis not present

## 2024-04-14 DIAGNOSIS — Z7401 Bed confinement status: Secondary | ICD-10-CM | POA: Diagnosis not present

## 2024-04-14 DIAGNOSIS — R41841 Cognitive communication deficit: Secondary | ICD-10-CM | POA: Diagnosis not present

## 2024-04-14 DIAGNOSIS — E559 Vitamin D deficiency, unspecified: Secondary | ICD-10-CM | POA: Diagnosis not present

## 2024-04-14 DIAGNOSIS — N1832 Chronic kidney disease, stage 3b: Secondary | ICD-10-CM | POA: Diagnosis not present

## 2024-04-14 DIAGNOSIS — K219 Gastro-esophageal reflux disease without esophagitis: Secondary | ICD-10-CM | POA: Diagnosis not present

## 2024-04-14 DIAGNOSIS — E1169 Type 2 diabetes mellitus with other specified complication: Secondary | ICD-10-CM

## 2024-04-14 DIAGNOSIS — R2681 Unsteadiness on feet: Secondary | ICD-10-CM | POA: Diagnosis not present

## 2024-04-14 LAB — CULTURE, BLOOD (ROUTINE X 2)
Culture: NO GROWTH
Culture: NO GROWTH
Special Requests: ADEQUATE

## 2024-04-14 LAB — GLUCOSE, CAPILLARY
Glucose-Capillary: 173 mg/dL — ABNORMAL HIGH (ref 70–99)
Glucose-Capillary: 186 mg/dL — ABNORMAL HIGH (ref 70–99)

## 2024-04-14 LAB — BASIC METABOLIC PANEL WITH GFR
Anion gap: 9 (ref 5–15)
BUN: 11 mg/dL (ref 8–23)
CO2: 27 mmol/L (ref 22–32)
Calcium: 8.8 mg/dL — ABNORMAL LOW (ref 8.9–10.3)
Chloride: 103 mmol/L (ref 98–111)
Creatinine, Ser: 1.09 mg/dL — ABNORMAL HIGH (ref 0.44–1.00)
GFR, Estimated: 51 mL/min — ABNORMAL LOW (ref >60)
Glucose, Bld: 179 mg/dL — ABNORMAL HIGH (ref 70–99)
Potassium: 3.7 mmol/L (ref 3.5–5.1)
Sodium: 139 mmol/L (ref 135–145)

## 2024-04-14 MED ORDER — AMOXICILLIN-POT CLAVULANATE 875-125 MG PO TABS: 1.0000 | ORAL_TABLET | Freq: Two times a day (BID) | ORAL | 0 refills | Status: AC

## 2024-04-14 MED ORDER — AMOXICILLIN-POT CLAVULANATE 875-125 MG PO TABS
1.0000 | ORAL_TABLET | Freq: Two times a day (BID) | ORAL | Status: DC
  Filled 2024-04-14: qty 1

## 2024-04-14 MED ORDER — LISINOPRIL 10 MG PO TABS: 10.0000 mg | ORAL_TABLET | Freq: Every day | ORAL | 0 refills | Status: DC

## 2024-04-14 MED ORDER — POTASSIUM CHLORIDE CRYS ER 20 MEQ PO TBCR
40.0000 meq | EXTENDED_RELEASE_TABLET | Freq: Once | ORAL | Status: AC
  Administered 2024-04-14: 40 meq via ORAL
  Filled 2024-04-14: qty 2

## 2024-04-14 NOTE — Care Management Important Message (Signed)
 Important Message  Patient Details  Name: Felicia Shelton MRN: [id number] Date of Birth: 10/19/1941   Important Message Given:  Yes - Medicare IM     Janith Melnick 04/14/2024, 11:06 AM

## 2024-04-14 NOTE — TOC Transition Note (Signed)
 Transition of Care [facility]) - Discharge Note   Patient Details  Name: Felicia Shelton MRN: [id number] Date of Birth: 07/24/1941  [facility] CM/SW Contact:  Arron Big, LCSWA Phone Number: 04/14/2024, 2:00 PM   Clinical Narrative:   Patient will DC to: [facility] Anticipated DC date: 04/14/24 Family notified: Felicia Shelton (dtr) Transport by: Lyna Sandhoff   Per MD patient ready for DC to [facility]. RN to call report prior to discharge [phone]; room 219). RN, patient, patient's family, and facility notified of DC. Discharge Summary and FL2 sent to facility. DC packet on chart. Ambulance transport requested for patient 2:07 PM.   CSW will sign off for now as social work intervention is no longer needed. Please consult us  again if new needs arise.      Final next level of care: Skilled Nursing Facility Barriers to Discharge: Barriers Resolved   Patient Goals and CMS Choice   CMS Medicare.gov Compare Post Acute Care list provided to:: Patient Represenative (must comment) (Daughter- Felicia Shelton) Choice offered to / list presented to : Adult Children      Discharge Placement              Patient chooses bed at: [facility] Patient to be transferred to facility by: [facility] Name of family member notified: Felicia Shelton (dtr) Patient and family notified of of transfer: 04/14/24  Discharge Plan and Services Additional resources added to the After Visit Summary for                                       Social Drivers of Health (SDOH) Interventions SDOH Screenings   Food Insecurity: No Food Insecurity (04/09/2024)  Housing: Low Risk  (04/09/2024)  Transportation Needs: No Transportation Needs (04/09/2024)  Utilities: Not At Risk (04/09/2024)  Depression (PHQ2-9): Low Risk  (01/21/2019)  Social Connections: Socially Integrated (04/09/2024)  Tobacco Use: Medium Risk (04/09/2024)     Readmission Risk Interventions     No data to display

## 2024-04-14 NOTE — Hospital Course (Signed)
 Mrs. Lacerda was admitted to the hospital with the working diagnosis of sepsis due to pneumonia.   83 yo female with the past medical history of hypertension, hyperlipidemia, GERD, T2DM, and CKD stage 3b who presented with confusion.  She was noted to have intermittent and worsening confusion over several days prior to admission. This progress to generalized weakness to the point where she was not able to stand on her feet. EMS was called and patient was transported to the ED.  On her initial physical examination her blood pressure was 185/76, HR 128, RR 22 and 02 saturation 94%. Lungs with no wheezing, positive rales at the right base, increased work of breathing, heart with S1 and S2 present and tachycardic with no gallops or rubs, abdomen with no distention and no lower extremity edema.   Na 135, K 2.8 Cl 97 bicarbonate 23, glucose 233, bun 16 cr 1,63  Mg 1,9  AST 48 ALT 27  Lactic acid 2,0 and 1,9  Wbc 17.7 hgb 12.8 plt 201  Urine analysis SG 1,016, protein 100, glucose > 500, moderate hgb, negative leukocytes, wbc 0-5 rbc 0-5  Toxicology negative  Alcohol < 15   CT head with no acute changes   Chest radiograph with right rotation, no cardiomegaly, positive right lower lobe patchy infiltrate.   EKG 136 bpm, normal axis, qtc 504, sinus rhythm with left atrial enlargement, no significant ST segment or T wave changes.   Patient was placed on IV fluids and IV antibiotics.  She responded well to medical therapy, but continue very weak and deconditioned.  PT and OT were consulted. Patient is being discharge to SNF to continue physical and occupational therapy.

## 2024-04-14 NOTE — Assessment & Plan Note (Addendum)
 AKI, Hypokalemia   Patient was placed on IV fluids with improvement in renal function. Electrolytes have been corrected.  Resume SGLT 2 inh and lisinopril  (lower dose).  Will check renal function and electrolytes prior to discharge.  Follow up renal function as outpatient in 7 days

## 2024-04-14 NOTE — Assessment & Plan Note (Signed)
 Continue pantoprazole.

## 2024-04-14 NOTE — Assessment & Plan Note (Addendum)
 Continue blood pressure control with metoprolol  and will resume lisinopril  at 10 mg daily.  Hold on amlodipine  for now.

## 2024-04-14 NOTE — Assessment & Plan Note (Addendum)
 Complicated with severe sepsis present on admission.  Patient was placed on IV antibiotic therapy with ceftriaxone and po doxycycline, and received IV fluids.  Sepsis resolved.  Cultures have been no growth.  Wbc trending down to 11.5   Patient will continue antibiotic therapy with Augmentin for 3 more days to complete 8 days course.   Acute metabolic encephalopathy has resolved, for now will continue to hold on gabapentin .

## 2024-04-14 NOTE — Assessment & Plan Note (Addendum)
 Patient was placed on insulin  sliding scale for glucose cover and monitoring At the time of her discharge her glucose was 173 mg/dl fasting.   At the time of discharge resume SGLT 2 inh and metformin .   Continue statin therapy and ezetimibe .

## 2024-04-14 NOTE — Progress Notes (Signed)
 Occupational Therapy Treatment Patient Details Name: Felicia Shelton MRN: [id number] DOB: 1941-02-11 Today's Date: 04/14/2024   History of present illness 83 year old female admitted from home with concerns of altered mental status. Pt with sepsis likely secondary to pneumonia. PMH: diabetes, hyperlipidemia, hypertension, neuromuscular disorder, GERD.   OT comments  Patient received seated on EOB and agreeable to OT treatment. Patient able to stand and ambulate to sink for self care tasks with min assist and cues for hand placement. Patient requiring min assist for balance while standing at sink with patient demonstrating mild LOB and min assist to correct. Patient will benefit from continued inpatient follow up therapy, <3 hours/day.  Acute OT to continue to follow to address established goals to facilitate DC to next venue of care.        If plan is discharge home, recommend the following:  A lot of help with walking and/or transfers;A lot of help with bathing/dressing/bathroom;Assistance with cooking/housework;Direct supervision/assist for medications management;Direct supervision/assist for financial management;Assist for transportation;Help with stairs or ramp for entrance;Supervision due to cognitive status   Equipment Recommendations  None recommended by OT    Recommendations for Other Services      Precautions / Restrictions Precautions Precautions: Fall Recall of Precautions/Restrictions: Intact Restrictions Weight Bearing Restrictions Per Provider Order: No       Mobility Bed Mobility Overal bed mobility: Needs Assistance             General bed mobility comments: seated on EOB upon entry and left in recliner    Transfers Overall transfer level: Needs assistance Equipment used: Rolling walker (2 wheels) Transfers: Sit to/from Stand Sit to Stand: Min assist           General transfer comment: min assist to stand from EOB and recliner with cues for hand  placement     Balance Overall balance assessment: Needs assistance Sitting-balance support: Feet supported Sitting balance-Leahy Scale: Good Sitting balance - Comments: on EOB upon entry   Standing balance support: Bilateral upper extremity supported, Single extremity supported, During functional activity Standing balance-Leahy Scale: Poor Standing balance comment: able to stand at sink for self care tasks with one extremity support and min assist for balance with patient demonstrating mild LOB                           ADL either performed or assessed with clinical judgement   ADL Overall ADL's : Needs assistance/impaired     Grooming: Wash/dry hands;Wash/dry face;Oral care;Contact guard assist;Minimal assistance;Standing Grooming Details (indicate cue type and reason): mild LOB while standing at sink with min assist to correct Upper Body Bathing: Minimal assistance;Sitting   Lower Body Bathing: Moderate assistance;Sit to/from stand   Upper Body Dressing : Minimal assistance   Lower Body Dressing: Minimal assistance;Sitting/lateral leans Lower Body Dressing Details (indicate cue type and reason): to change socks             Functional mobility during ADLs: Minimal assistance;Rolling walker (2 wheels)      Extremity/Trunk Assessment              Vision       Perception     Praxis     Communication Communication Communication: No apparent difficulties   Cognition Arousal: Alert Behavior During Therapy: WFL for tasks assessed/performed Cognition: Cognition impaired   Orientation impairments: Situation Awareness: Intellectual awareness impaired, Online awareness impaired Memory impairment (select all impairments): Short-term memory, Working [employer] fast streamer, Conservation officer, historic buildings  Attention impairment (select first level of impairment): Sustained attention Executive functioning impairment (select all impairments): Initiation, Organization, Reasoning,  Problem solving OT - Cognition Comments: alert and oriented x3                 Following commands: Intact Following commands impaired: Follows one step commands with increased time      Cueing   Cueing Techniques: Verbal cues  Exercises      Shoulder Instructions       General Comments VSS    Pertinent Vitals/ Pain       Pain Assessment Pain Assessment: No/denies pain  Home Living                                          Prior Functioning/Environment              Frequency  Min 2X/week        Progress Toward Goals  OT Goals(current goals can now be found in the care plan section)  Progress towards OT goals: Progressing toward goals  Acute Rehab OT Goals Patient Stated Goal: to go to rehab OT Goal Formulation: With patient Time For Goal Achievement: 04/25/24 Potential to Achieve Goals: Good ADL Goals Pt Will Perform Lower Body Bathing: with contact guard assist;sit to/from stand Pt Will Perform Lower Body Dressing: sit to/from stand;with min assist Pt Will Transfer to Toilet: with min assist;ambulating Pt Will Perform Toileting - Clothing Manipulation and hygiene: with min assist;sit to/from stand Additional ADL Goal #1: Pt will demosntrate selective attention to task in minimally distracting environment, requiring less than 3 redirectional cues during ADL task  Plan      Co-evaluation                 AM-PAC OT "6 Clicks" Daily Activity     Outcome Measure   Help from another person eating meals?: A Little Help from another person taking care of personal grooming?: A Little Help from another person toileting, which includes using toliet, bedpan, or urinal?: A Lot Help from another person bathing (including washing, rinsing, drying)?: A Lot Help from another person to put on and taking off regular upper body clothing?: A Little Help from another person to put on and taking off regular lower body clothing?: A Lot 6 Click  Score: 15    End of Session Equipment Utilized During Treatment: Gait belt;Rolling walker (2 wheels)  OT Visit Diagnosis: Unsteadiness on feet (R26.81);Other abnormalities of gait and mobility (R26.89);Muscle weakness (generalized) (M62.81);Other symptoms and signs involving cognitive function   Activity Tolerance Patient tolerated treatment well   Patient Left in chair;with call bell/phone within reach;with chair alarm set   Nurse Communication Mobility status        Time: [phone] OT Time Calculation (min): 25 min  Charges: OT General Charges $OT Visit: 1 Visit OT Treatments $Self Care/Home Management : 23-37 mins  Anitra Barn, OTA Acute Rehabilitation Services  Office [phone]   Jovita Nipper 04/14/2024, 11:05 AM

## 2024-04-14 NOTE — Plan of Care (Signed)
  Problem: Clinical Measurements: Goal: Signs and symptoms of infection will decrease Outcome: Progressing   Problem: Respiratory: Goal: Ability to maintain adequate ventilation will improve Outcome: Progressing   Problem: Health Behavior/Discharge Planning: Goal: Ability to identify and utilize available resources and services will improve Outcome: Progressing Goal: Ability to manage health-related needs will improve Outcome: Progressing   Problem: Skin Integrity: Goal: Risk for impaired skin integrity will decrease Outcome: Progressing   Problem: Tissue Perfusion: Goal: Adequacy of tissue perfusion will improve Outcome: Progressing   Problem: Clinical Measurements: Goal: Will remain free from infection Outcome: Progressing Goal: Diagnostic test results will improve Outcome: Progressing Goal: Respiratory complications will improve Outcome: Progressing

## 2024-04-14 NOTE — Discharge Summary (Signed)
 Physician Discharge Summary   Patient: Felicia Shelton MRN: [id number] DOB: 16-Jul-1941  Admit date:     04/09/2024  Discharge date: 04/14/24  Discharge Physician: Curlee Doss Grissel Tyrell   PCP: Ransom Byers, MD   Recommendations at discharge:    Resumed lisinopril  at a lower dose of 10 mg daily and holding on amlodipine .  Discontinue gabapentin  for now due to recovering from acute metabolic encephalopathy. Resumed SGLT 2 inh and metformin  Follow up renal function and electrolytes in 7 days as outpatient.  Follow up with Dr Donia Furlough in 7 to 10 days.   Discharge Diagnoses: Principal Problem:   Severe sepsis (HCC) Active Problems:   Hypertension   Diabetes mellitus (HCC)   Gastroesophageal reflux disease   Chronic kidney disease, stage 3b (HCC)   Pure hypercholesterolemia   Prolonged QT interval  Resolved Problems:   Chronic kidney disease  Hospital Course: [name] was admitted to the hospital with the working diagnosis of sepsis due to pneumonia.   83 yo female with the past medical history of hypertension, hyperlipidemia, GERD, T2DM, and CKD stage 3b who presented with confusion.  She was noted to have intermittent and worsening confusion over several days prior to admission. This progress to generalized weakness to the point where she was not able to stand on her feet. EMS was called and patient was transported to the ED.  On her initial physical examination her blood pressure was 185/76, HR 128, RR 22 and 02 saturation 94%. Lungs with no wheezing, positive rales at the right base, increased work of breathing, heart with S1 and S2 present and tachycardic with no gallops or rubs, abdomen with no distention and no lower extremity edema.   Na 135, K 2.8 Cl 97 bicarbonate 23, glucose 233, bun 16 cr 1,63  Mg 1,9  AST 48 ALT 27  Lactic acid 2,0 and 1,9  Wbc 17.7 hgb 12.8 plt 201  Urine analysis SG 1,016, protein 100, glucose > 500, moderate hgb, negative leukocytes, wbc  0-5 rbc 0-5  Toxicology negative  Alcohol < 15   CT head with no acute changes   Chest radiograph with right rotation, no cardiomegaly, positive right lower lobe patchy infiltrate.   EKG 136 bpm, normal axis, qtc 504, sinus rhythm with left atrial enlargement, no significant ST segment or T wave changes.   Patient was placed on IV fluids and IV antibiotics.  She responded well to medical therapy, but continue very weak and deconditioned.  PT and OT were consulted. Patient is being discharge to SNF to continue physical and occupational therapy.   Assessment and Plan: * Right lower lobe pneumonia Complicated with severe sepsis present on admission.  Patient was placed on IV antibiotic therapy with ceftriaxone and po doxycycline, and received IV fluids.  Sepsis resolved.  Cultures have been no growth.  Wbc trending down to 11.5   Patient will continue antibiotic therapy with Augmentin for 3 more days to complete 8 days course.   Acute metabolic encephalopathy has resolved, for now will continue to hold on gabapentin .   Chronic kidney disease, stage 3b (HCC) AKI, Hypokalemia   Patient was placed on IV fluids with improvement in renal function. Electrolytes have been corrected.  Resume SGLT 2 inh and lisinopril  (lower dose).  Will check renal function and electrolytes prior to discharge.  Follow up renal function as outpatient in 7 days   Hypertension Continue blood pressure control with metoprolol  and will resume lisinopril  at 10 mg daily.  Hold  on amlodipine  for now.   Gastroesophageal reflux disease Continue pantoprazole    Type 2 diabetes mellitus with hyperlipidemia (HCC) Patient was placed on insulin  sliding scale for glucose cover and monitoring At the time of her discharge her glucose was 173 mg/dl fasting.   At the time of discharge resume SGLT 2 inh and metformin .   Continue statin therapy and ezetimibe .        Consultants: none  Procedures performed: none    Disposition: Skilled nursing facility Diet recommendation:  Cardiac diet DISCHARGE MEDICATION: Allergies as of 04/14/2024   No Known Allergies      Medication List     STOP taking these medications    amLODipine  10 MG tablet Commonly known as: NORVASC    amLODipine  5 MG tablet Commonly known as: NORVASC    gabapentin  300 MG capsule Commonly known as: NEURONTIN        TAKE these medications    acetaminophen  650 MG CR tablet Commonly known as: TYLENOL  Take 1,300 mg by mouth daily as needed for pain.   amoxicillin -clavulanate 875-125 MG tablet Commonly known as: AUGMENTIN  Take 1 tablet by mouth every 12 (twelve) hours for 3 days. Start taking on: Apr 15, 2024   atorvastatin  40 MG tablet Commonly known as: LIPITOR Take 40 mg by mouth daily.   b complex vitamins capsule Take 1 capsule by mouth daily.   CENTRUM ADULT PO Take 1 tablet by mouth daily at 12 noon.   ezetimibe  10 MG tablet Commonly known as: ZETIA  Take 1 tablet (10 mg total) by mouth daily.   Farxiga 10 MG Tabs tablet Generic drug: dapagliflozin propanediol Take 10 mg by mouth daily.   lisinopril  10 MG tablet Commonly known as: ZESTRIL  Take 1 tablet (10 mg total) by mouth daily. What changed:  medication strength how much to take   metFORMIN  500 MG tablet Commonly known as: GLUCOPHAGE  Take 500 mg by mouth 2 (two) times daily.   metoprolol  succinate 50 MG 24 hr tablet Commonly known as: TOPROL -XL Take 1 tablet (50 mg total) by mouth daily.   THERAWORX RELIEF EX Apply 1 application topically daily as needed (cramps).   Vitamin D-3 125 MCG (5000 UT) Tabs Take 5,000 Units by mouth every Monday, Wednesday, and Friday.        Contact information for after-discharge care     Destination     [facility] Preferred SNF .   Service: Skilled Nursing Contact information: [address] [place]  [address] [phone]                     Discharge Exam: Filed Weights   04/12/24 0418 04/13/24 0359 04/14/24 0428  Weight: 83.3 kg 75.5 kg 79.9 kg   BP (!) 152/81 (BP Location: Left Arm)   Pulse 88   Temp 98.7 F (37.1 C) (Oral)   Resp 18   Ht 5\' 5"  (1.651 m)   Wt 79.9 kg   SpO2 96%   BMI 29.31 kg/m   Patient is feeling well, dyspnea continue to improve, no chest pain, no nausea or vomiting, no abdominal pain. She continue very weak and deconditioned  Neurology awake and alert,  ENT with mild pallor Cardiovascular with S1 and S2 present and regular with no gallops, rubs or murmurs Respiratory with no rales or wheezing, no rhonchi  Abdomen with no distention  Trace non pitting lower extremity edema, bilaterally   Condition at discharge: stable  The results of significant diagnostics from this hospitalization (  including imaging, microbiology, ancillary and laboratory) are listed below for reference.   Imaging Studies: CT HEAD WO CONTRAST Result Date: 04/09/2024 CLINICAL DATA:  Altered mental status, nontraumatic (Ped 0-17y) EXAM: CT HEAD WITHOUT CONTRAST TECHNIQUE: Contiguous axial images were obtained from the base of the skull through the vertex without intravenous contrast. RADIATION DOSE REDUCTION: This exam was performed according to the departmental dose-optimization program which includes automated exposure control, adjustment of the mA and/or kV according to patient size and/or use of iterative reconstruction technique. COMPARISON:  None Available. FINDINGS: Brain: Mildly motion limited study. Within this limitation, no evidence of acute large vascular territory infarct, acute hemorrhage, mass lesion, midline shift or hydrocephalus. Patchy white matter hypodensities most likely represent chronic microvascular ischemic change. Vascular: No hyperdense vessel. Skull: No acute fracture. Sinuses/Orbits: Clear sinuses.  No acute orbital findings. Other: No mastoid effusions. IMPRESSION: No evidence of acute intracranial  abnormality. Electronically Signed   By: Stevenson Elbe M.D.   On: 04/09/2024 15:26   DG Chest Portable 1 View Result Date: 04/09/2024 CLINICAL DATA:  Sepsis. EXAM: PORTABLE CHEST 1 VIEW COMPARISON:  04/12/2021. FINDINGS: There are heterogeneous opacities overlying the right lower lung zone with air bronchogram and without volume loss small favoring pneumonia. Follow-up to clearing is recommended. Bilateral lung fields are otherwise clear. Bilateral costophrenic angles are clear. No significant pleural effusion or pneumothorax. Stable cardio-mediastinal silhouette. No acute osseous abnormalities. The soft tissues are within normal limits. IMPRESSION: *Right lower lung zone pneumonia. Follow-up to clearing is recommended. Electronically Signed   By: Beula Brunswick M.D.   On: 04/09/2024 14:14    Microbiology: Results for orders placed or performed during the hospital encounter of 04/09/24  Blood culture (routine x 2)     Status: None   Collection Time: 04/09/24  1:23 PM   Specimen: BLOOD  Result Value Ref Range Status   Specimen Description BLOOD SITE NOT SPECIFIED  Final   Special Requests   Final    BOTTLES DRAWN AEROBIC ONLY Blood Culture results may not be optimal due to an inadequate volume of blood received in culture bottles   Culture   Final    NO GROWTH 5 DAYS Performed at [facility], [address], [place], [place] [address]    Report Status 04/14/2024 FINAL  Final  Blood culture (routine x 2)     Status: None   Collection Time: 04/09/24  1:28 PM   Specimen: BLOOD  Result Value Ref Range Status   Specimen Description BLOOD RIGHT ANTECUBITAL  Final   Special Requests   Final    BOTTLES DRAWN AEROBIC AND ANAEROBIC Blood Culture adequate volume   Culture   Final    NO GROWTH 5 DAYS Performed at [facility], [address] [place], [place] [address]    Report Status 04/14/2024 FINAL  Final    Labs: CBC: Recent Labs  Lab 04/09/24 1318 04/09/24 1550  04/10/24 0342 04/11/24 0255  WBC 17.7*  --  11.1* 11.5*  HGB 12.8 12.2 9.1* 12.1  HCT 36.5 36.0 27.2* 35.7*  MCV 79.2*  --  80.5 79.3*  PLT 201  --  133* 205   Basic Metabolic Panel: Recent Labs  Lab 04/09/24 1318 04/09/24 1550 04/10/24 0342 04/11/24 0255 04/12/24 1116  NA 135 137 136 135  --   K 2.8* 2.9* 3.5 3.0* 3.1*  CL 97*  --  106 101  --   CO2 23  --  16* 23  --  GLUCOSE 233*  --  101* 160*  --   BUN 16  --  9 12  --   CREATININE 1.63*  --  0.85 1.16*  --   CALCIUM  8.8*  --  7.5* 8.5*  --   MG 1.9  --   --  1.9  --   PHOS  --   --   --  2.2* 2.6   Liver Function Tests: Recent Labs  Lab 04/09/24 1318 04/10/24 0342 04/11/24 0255  AST 48* 46* 101*  ALT 27 26 55*  ALKPHOS 59 41 66  BILITOT 1.1 1.0 0.9  PROT 6.9 4.4* 6.0*  ALBUMIN 2.8* 1.6* 2.2*   CBG: Recent Labs  Lab 04/13/24 1124 04/13/24 1625 04/13/24 2056 04/14/24 0553 04/14/24 1056  GLUCAP 205* 161* 118* 173* 186*    Discharge time spent: greater than 30 minutes.  Signed: Albertus Alt, MD [facility] 04/14/2024

## 2024-04-15 DIAGNOSIS — E1169 Type 2 diabetes mellitus with other specified complication: Secondary | ICD-10-CM | POA: Diagnosis not present

## 2024-04-15 DIAGNOSIS — E785 Hyperlipidemia, unspecified: Secondary | ICD-10-CM | POA: Diagnosis not present

## 2024-04-15 DIAGNOSIS — I10 Essential (primary) hypertension: Secondary | ICD-10-CM | POA: Diagnosis not present

## 2024-04-15 DIAGNOSIS — E559 Vitamin D deficiency, unspecified: Secondary | ICD-10-CM | POA: Diagnosis not present

## 2024-04-16 DIAGNOSIS — E1169 Type 2 diabetes mellitus with other specified complication: Secondary | ICD-10-CM | POA: Diagnosis not present

## 2024-04-16 DIAGNOSIS — I10 Essential (primary) hypertension: Secondary | ICD-10-CM | POA: Diagnosis not present

## 2024-04-16 DIAGNOSIS — J189 Pneumonia, unspecified organism: Secondary | ICD-10-CM | POA: Diagnosis not present

## 2024-04-16 DIAGNOSIS — N1832 Chronic kidney disease, stage 3b: Secondary | ICD-10-CM | POA: Diagnosis not present

## 2024-04-19 DIAGNOSIS — I10 Essential (primary) hypertension: Secondary | ICD-10-CM | POA: Diagnosis not present

## 2024-04-21 DIAGNOSIS — J189 Pneumonia, unspecified organism: Secondary | ICD-10-CM | POA: Diagnosis not present

## 2024-04-21 DIAGNOSIS — I10 Essential (primary) hypertension: Secondary | ICD-10-CM | POA: Diagnosis not present

## 2024-04-21 DIAGNOSIS — E1169 Type 2 diabetes mellitus with other specified complication: Secondary | ICD-10-CM | POA: Diagnosis not present

## 2024-04-21 DIAGNOSIS — N1832 Chronic kidney disease, stage 3b: Secondary | ICD-10-CM | POA: Diagnosis not present

## 2024-04-22 DIAGNOSIS — J188 Other pneumonia, unspecified organism: Secondary | ICD-10-CM | POA: Diagnosis not present

## 2024-04-22 DIAGNOSIS — E1169 Type 2 diabetes mellitus with other specified complication: Secondary | ICD-10-CM | POA: Diagnosis not present

## 2024-04-22 DIAGNOSIS — E1122 Type 2 diabetes mellitus with diabetic chronic kidney disease: Secondary | ICD-10-CM | POA: Diagnosis not present

## 2024-04-22 DIAGNOSIS — K219 Gastro-esophageal reflux disease without esophagitis: Secondary | ICD-10-CM | POA: Diagnosis not present

## 2024-04-22 DIAGNOSIS — Z7984 Long term (current) use of oral hypoglycemic drugs: Secondary | ICD-10-CM | POA: Diagnosis not present

## 2024-04-22 DIAGNOSIS — E876 Hypokalemia: Secondary | ICD-10-CM | POA: Diagnosis not present

## 2024-04-22 DIAGNOSIS — N179 Acute kidney failure, unspecified: Secondary | ICD-10-CM | POA: Diagnosis not present

## 2024-04-22 DIAGNOSIS — E7800 Pure hypercholesterolemia, unspecified: Secondary | ICD-10-CM | POA: Diagnosis not present

## 2024-04-22 DIAGNOSIS — N1832 Chronic kidney disease, stage 3b: Secondary | ICD-10-CM | POA: Diagnosis not present

## 2024-04-22 DIAGNOSIS — I129 Hypertensive chronic kidney disease with stage 1 through stage 4 chronic kidney disease, or unspecified chronic kidney disease: Secondary | ICD-10-CM | POA: Diagnosis not present

## 2024-04-23 ENCOUNTER — Ambulatory Visit (HOSPITAL_COMMUNITY)

## 2024-04-28 DIAGNOSIS — N179 Acute kidney failure, unspecified: Secondary | ICD-10-CM | POA: Diagnosis not present

## 2024-04-28 DIAGNOSIS — J188 Other pneumonia, unspecified organism: Secondary | ICD-10-CM | POA: Diagnosis not present

## 2024-04-28 DIAGNOSIS — D72828 Other elevated white blood cell count: Secondary | ICD-10-CM | POA: Diagnosis not present

## 2024-04-28 DIAGNOSIS — K219 Gastro-esophageal reflux disease without esophagitis: Secondary | ICD-10-CM | POA: Diagnosis not present

## 2024-04-28 DIAGNOSIS — N1832 Chronic kidney disease, stage 3b: Secondary | ICD-10-CM | POA: Diagnosis not present

## 2024-04-28 DIAGNOSIS — E1169 Type 2 diabetes mellitus with other specified complication: Secondary | ICD-10-CM | POA: Diagnosis not present

## 2024-04-28 DIAGNOSIS — R413 Other amnesia: Secondary | ICD-10-CM | POA: Diagnosis not present

## 2024-04-28 DIAGNOSIS — Z7984 Long term (current) use of oral hypoglycemic drugs: Secondary | ICD-10-CM | POA: Diagnosis not present

## 2024-04-28 DIAGNOSIS — I129 Hypertensive chronic kidney disease with stage 1 through stage 4 chronic kidney disease, or unspecified chronic kidney disease: Secondary | ICD-10-CM | POA: Diagnosis not present

## 2024-04-28 DIAGNOSIS — E1122 Type 2 diabetes mellitus with diabetic chronic kidney disease: Secondary | ICD-10-CM | POA: Diagnosis not present

## 2024-04-28 DIAGNOSIS — E876 Hypokalemia: Secondary | ICD-10-CM | POA: Diagnosis not present

## 2024-04-28 DIAGNOSIS — E7800 Pure hypercholesterolemia, unspecified: Secondary | ICD-10-CM | POA: Diagnosis not present

## 2024-04-28 DIAGNOSIS — I10 Essential (primary) hypertension: Secondary | ICD-10-CM | POA: Diagnosis not present

## 2024-04-28 DIAGNOSIS — E119 Type 2 diabetes mellitus without complications: Secondary | ICD-10-CM | POA: Diagnosis not present

## 2024-05-06 DIAGNOSIS — I129 Hypertensive chronic kidney disease with stage 1 through stage 4 chronic kidney disease, or unspecified chronic kidney disease: Secondary | ICD-10-CM | POA: Diagnosis not present

## 2024-05-06 DIAGNOSIS — E7800 Pure hypercholesterolemia, unspecified: Secondary | ICD-10-CM | POA: Diagnosis not present

## 2024-05-06 DIAGNOSIS — N1832 Chronic kidney disease, stage 3b: Secondary | ICD-10-CM | POA: Diagnosis not present

## 2024-05-06 DIAGNOSIS — E1169 Type 2 diabetes mellitus with other specified complication: Secondary | ICD-10-CM | POA: Diagnosis not present

## 2024-05-06 DIAGNOSIS — E1122 Type 2 diabetes mellitus with diabetic chronic kidney disease: Secondary | ICD-10-CM | POA: Diagnosis not present

## 2024-05-06 DIAGNOSIS — Z7984 Long term (current) use of oral hypoglycemic drugs: Secondary | ICD-10-CM | POA: Diagnosis not present

## 2024-05-06 DIAGNOSIS — J188 Other pneumonia, unspecified organism: Secondary | ICD-10-CM | POA: Diagnosis not present

## 2024-05-06 DIAGNOSIS — K219 Gastro-esophageal reflux disease without esophagitis: Secondary | ICD-10-CM | POA: Diagnosis not present

## 2024-05-06 DIAGNOSIS — N179 Acute kidney failure, unspecified: Secondary | ICD-10-CM | POA: Diagnosis not present

## 2024-05-06 DIAGNOSIS — E876 Hypokalemia: Secondary | ICD-10-CM | POA: Diagnosis not present

## 2024-05-12 DIAGNOSIS — N179 Acute kidney failure, unspecified: Secondary | ICD-10-CM | POA: Diagnosis not present

## 2024-05-12 DIAGNOSIS — K219 Gastro-esophageal reflux disease without esophagitis: Secondary | ICD-10-CM | POA: Diagnosis not present

## 2024-05-12 DIAGNOSIS — E7800 Pure hypercholesterolemia, unspecified: Secondary | ICD-10-CM | POA: Diagnosis not present

## 2024-05-12 DIAGNOSIS — E1169 Type 2 diabetes mellitus with other specified complication: Secondary | ICD-10-CM | POA: Diagnosis not present

## 2024-05-12 DIAGNOSIS — E1122 Type 2 diabetes mellitus with diabetic chronic kidney disease: Secondary | ICD-10-CM | POA: Diagnosis not present

## 2024-05-12 DIAGNOSIS — J188 Other pneumonia, unspecified organism: Secondary | ICD-10-CM | POA: Diagnosis not present

## 2024-05-12 DIAGNOSIS — Z7984 Long term (current) use of oral hypoglycemic drugs: Secondary | ICD-10-CM | POA: Diagnosis not present

## 2024-05-12 DIAGNOSIS — E876 Hypokalemia: Secondary | ICD-10-CM | POA: Diagnosis not present

## 2024-05-12 DIAGNOSIS — N1832 Chronic kidney disease, stage 3b: Secondary | ICD-10-CM | POA: Diagnosis not present

## 2024-05-12 DIAGNOSIS — I129 Hypertensive chronic kidney disease with stage 1 through stage 4 chronic kidney disease, or unspecified chronic kidney disease: Secondary | ICD-10-CM | POA: Diagnosis not present

## 2024-05-17 DIAGNOSIS — N1832 Chronic kidney disease, stage 3b: Secondary | ICD-10-CM | POA: Diagnosis not present

## 2024-05-17 DIAGNOSIS — I10 Essential (primary) hypertension: Secondary | ICD-10-CM | POA: Diagnosis not present

## 2024-05-17 DIAGNOSIS — E1122 Type 2 diabetes mellitus with diabetic chronic kidney disease: Secondary | ICD-10-CM | POA: Diagnosis not present

## 2024-05-17 DIAGNOSIS — E7800 Pure hypercholesterolemia, unspecified: Secondary | ICD-10-CM | POA: Diagnosis not present

## 2024-05-19 DIAGNOSIS — E1169 Type 2 diabetes mellitus with other specified complication: Secondary | ICD-10-CM | POA: Diagnosis not present

## 2024-05-19 DIAGNOSIS — N179 Acute kidney failure, unspecified: Secondary | ICD-10-CM | POA: Diagnosis not present

## 2024-05-19 DIAGNOSIS — K219 Gastro-esophageal reflux disease without esophagitis: Secondary | ICD-10-CM | POA: Diagnosis not present

## 2024-05-19 DIAGNOSIS — I129 Hypertensive chronic kidney disease with stage 1 through stage 4 chronic kidney disease, or unspecified chronic kidney disease: Secondary | ICD-10-CM | POA: Diagnosis not present

## 2024-05-19 DIAGNOSIS — J188 Other pneumonia, unspecified organism: Secondary | ICD-10-CM | POA: Diagnosis not present

## 2024-05-19 DIAGNOSIS — E7800 Pure hypercholesterolemia, unspecified: Secondary | ICD-10-CM | POA: Diagnosis not present

## 2024-05-19 DIAGNOSIS — E1122 Type 2 diabetes mellitus with diabetic chronic kidney disease: Secondary | ICD-10-CM | POA: Diagnosis not present

## 2024-05-19 DIAGNOSIS — N1832 Chronic kidney disease, stage 3b: Secondary | ICD-10-CM | POA: Diagnosis not present

## 2024-05-19 DIAGNOSIS — Z7984 Long term (current) use of oral hypoglycemic drugs: Secondary | ICD-10-CM | POA: Diagnosis not present

## 2024-05-19 DIAGNOSIS — E876 Hypokalemia: Secondary | ICD-10-CM | POA: Diagnosis not present

## 2024-05-22 DIAGNOSIS — N179 Acute kidney failure, unspecified: Secondary | ICD-10-CM | POA: Diagnosis not present

## 2024-05-22 DIAGNOSIS — I129 Hypertensive chronic kidney disease with stage 1 through stage 4 chronic kidney disease, or unspecified chronic kidney disease: Secondary | ICD-10-CM | POA: Diagnosis not present

## 2024-05-22 DIAGNOSIS — E1169 Type 2 diabetes mellitus with other specified complication: Secondary | ICD-10-CM | POA: Diagnosis not present

## 2024-05-22 DIAGNOSIS — K219 Gastro-esophageal reflux disease without esophagitis: Secondary | ICD-10-CM | POA: Diagnosis not present

## 2024-05-22 DIAGNOSIS — N1832 Chronic kidney disease, stage 3b: Secondary | ICD-10-CM | POA: Diagnosis not present

## 2024-05-22 DIAGNOSIS — E1122 Type 2 diabetes mellitus with diabetic chronic kidney disease: Secondary | ICD-10-CM | POA: Diagnosis not present

## 2024-05-22 DIAGNOSIS — E7800 Pure hypercholesterolemia, unspecified: Secondary | ICD-10-CM | POA: Diagnosis not present

## 2024-05-22 DIAGNOSIS — J188 Other pneumonia, unspecified organism: Secondary | ICD-10-CM | POA: Diagnosis not present

## 2024-05-22 DIAGNOSIS — E876 Hypokalemia: Secondary | ICD-10-CM | POA: Diagnosis not present

## 2024-05-22 DIAGNOSIS — Z7984 Long term (current) use of oral hypoglycemic drugs: Secondary | ICD-10-CM | POA: Diagnosis not present

## 2024-05-24 DIAGNOSIS — E1122 Type 2 diabetes mellitus with diabetic chronic kidney disease: Secondary | ICD-10-CM | POA: Diagnosis not present

## 2024-05-24 DIAGNOSIS — I129 Hypertensive chronic kidney disease with stage 1 through stage 4 chronic kidney disease, or unspecified chronic kidney disease: Secondary | ICD-10-CM | POA: Diagnosis not present

## 2024-05-24 DIAGNOSIS — E876 Hypokalemia: Secondary | ICD-10-CM | POA: Diagnosis not present

## 2024-05-24 DIAGNOSIS — E7800 Pure hypercholesterolemia, unspecified: Secondary | ICD-10-CM | POA: Diagnosis not present

## 2024-05-24 DIAGNOSIS — Z7984 Long term (current) use of oral hypoglycemic drugs: Secondary | ICD-10-CM | POA: Diagnosis not present

## 2024-05-24 DIAGNOSIS — K219 Gastro-esophageal reflux disease without esophagitis: Secondary | ICD-10-CM | POA: Diagnosis not present

## 2024-05-24 DIAGNOSIS — N1832 Chronic kidney disease, stage 3b: Secondary | ICD-10-CM | POA: Diagnosis not present

## 2024-05-24 DIAGNOSIS — N179 Acute kidney failure, unspecified: Secondary | ICD-10-CM | POA: Diagnosis not present

## 2024-05-24 DIAGNOSIS — J188 Other pneumonia, unspecified organism: Secondary | ICD-10-CM | POA: Diagnosis not present

## 2024-05-24 DIAGNOSIS — E1169 Type 2 diabetes mellitus with other specified complication: Secondary | ICD-10-CM | POA: Diagnosis not present

## 2024-05-31 DIAGNOSIS — K219 Gastro-esophageal reflux disease without esophagitis: Secondary | ICD-10-CM | POA: Diagnosis not present

## 2024-05-31 DIAGNOSIS — N179 Acute kidney failure, unspecified: Secondary | ICD-10-CM | POA: Diagnosis not present

## 2024-05-31 DIAGNOSIS — E1169 Type 2 diabetes mellitus with other specified complication: Secondary | ICD-10-CM | POA: Diagnosis not present

## 2024-05-31 DIAGNOSIS — E7800 Pure hypercholesterolemia, unspecified: Secondary | ICD-10-CM | POA: Diagnosis not present

## 2024-05-31 DIAGNOSIS — I129 Hypertensive chronic kidney disease with stage 1 through stage 4 chronic kidney disease, or unspecified chronic kidney disease: Secondary | ICD-10-CM | POA: Diagnosis not present

## 2024-05-31 DIAGNOSIS — Z7984 Long term (current) use of oral hypoglycemic drugs: Secondary | ICD-10-CM | POA: Diagnosis not present

## 2024-05-31 DIAGNOSIS — E1122 Type 2 diabetes mellitus with diabetic chronic kidney disease: Secondary | ICD-10-CM | POA: Diagnosis not present

## 2024-05-31 DIAGNOSIS — J188 Other pneumonia, unspecified organism: Secondary | ICD-10-CM | POA: Diagnosis not present

## 2024-05-31 DIAGNOSIS — E876 Hypokalemia: Secondary | ICD-10-CM | POA: Diagnosis not present

## 2024-05-31 DIAGNOSIS — N1832 Chronic kidney disease, stage 3b: Secondary | ICD-10-CM | POA: Diagnosis not present

## 2024-06-02 DIAGNOSIS — I10 Essential (primary) hypertension: Secondary | ICD-10-CM | POA: Diagnosis not present

## 2024-06-02 DIAGNOSIS — E1122 Type 2 diabetes mellitus with diabetic chronic kidney disease: Secondary | ICD-10-CM | POA: Diagnosis not present

## 2024-06-02 DIAGNOSIS — R946 Abnormal results of thyroid function studies: Secondary | ICD-10-CM | POA: Diagnosis not present

## 2024-06-02 DIAGNOSIS — N1832 Chronic kidney disease, stage 3b: Secondary | ICD-10-CM | POA: Diagnosis not present

## 2024-06-07 DIAGNOSIS — E7800 Pure hypercholesterolemia, unspecified: Secondary | ICD-10-CM | POA: Diagnosis not present

## 2024-06-07 DIAGNOSIS — Z7984 Long term (current) use of oral hypoglycemic drugs: Secondary | ICD-10-CM | POA: Diagnosis not present

## 2024-06-07 DIAGNOSIS — J188 Other pneumonia, unspecified organism: Secondary | ICD-10-CM | POA: Diagnosis not present

## 2024-06-07 DIAGNOSIS — I129 Hypertensive chronic kidney disease with stage 1 through stage 4 chronic kidney disease, or unspecified chronic kidney disease: Secondary | ICD-10-CM | POA: Diagnosis not present

## 2024-06-07 DIAGNOSIS — E1122 Type 2 diabetes mellitus with diabetic chronic kidney disease: Secondary | ICD-10-CM | POA: Diagnosis not present

## 2024-06-07 DIAGNOSIS — N179 Acute kidney failure, unspecified: Secondary | ICD-10-CM | POA: Diagnosis not present

## 2024-06-07 DIAGNOSIS — K219 Gastro-esophageal reflux disease without esophagitis: Secondary | ICD-10-CM | POA: Diagnosis not present

## 2024-06-07 DIAGNOSIS — E876 Hypokalemia: Secondary | ICD-10-CM | POA: Diagnosis not present

## 2024-06-07 DIAGNOSIS — N1832 Chronic kidney disease, stage 3b: Secondary | ICD-10-CM | POA: Diagnosis not present

## 2024-06-07 DIAGNOSIS — E1169 Type 2 diabetes mellitus with other specified complication: Secondary | ICD-10-CM | POA: Diagnosis not present

## 2024-06-15 ENCOUNTER — Other Ambulatory Visit: Payer: Medicare Other

## 2024-06-16 ENCOUNTER — Encounter (HOSPITAL_BASED_OUTPATIENT_CLINIC_OR_DEPARTMENT_OTHER): Payer: Self-pay

## 2024-06-16 ENCOUNTER — Other Ambulatory Visit (HOSPITAL_BASED_OUTPATIENT_CLINIC_OR_DEPARTMENT_OTHER)

## 2024-06-16 DIAGNOSIS — J188 Other pneumonia, unspecified organism: Secondary | ICD-10-CM | POA: Diagnosis not present

## 2024-06-16 DIAGNOSIS — I129 Hypertensive chronic kidney disease with stage 1 through stage 4 chronic kidney disease, or unspecified chronic kidney disease: Secondary | ICD-10-CM | POA: Diagnosis not present

## 2024-06-16 DIAGNOSIS — K219 Gastro-esophageal reflux disease without esophagitis: Secondary | ICD-10-CM | POA: Diagnosis not present

## 2024-06-16 DIAGNOSIS — N1832 Chronic kidney disease, stage 3b: Secondary | ICD-10-CM | POA: Diagnosis not present

## 2024-06-16 DIAGNOSIS — E876 Hypokalemia: Secondary | ICD-10-CM | POA: Diagnosis not present

## 2024-06-16 DIAGNOSIS — E7800 Pure hypercholesterolemia, unspecified: Secondary | ICD-10-CM | POA: Diagnosis not present

## 2024-06-16 DIAGNOSIS — N179 Acute kidney failure, unspecified: Secondary | ICD-10-CM | POA: Diagnosis not present

## 2024-06-16 DIAGNOSIS — E1122 Type 2 diabetes mellitus with diabetic chronic kidney disease: Secondary | ICD-10-CM | POA: Diagnosis not present

## 2024-06-16 DIAGNOSIS — Z7984 Long term (current) use of oral hypoglycemic drugs: Secondary | ICD-10-CM | POA: Diagnosis not present

## 2024-06-16 DIAGNOSIS — E1169 Type 2 diabetes mellitus with other specified complication: Secondary | ICD-10-CM | POA: Diagnosis not present

## 2024-06-17 DIAGNOSIS — E7800 Pure hypercholesterolemia, unspecified: Secondary | ICD-10-CM | POA: Diagnosis not present

## 2024-06-17 DIAGNOSIS — I10 Essential (primary) hypertension: Secondary | ICD-10-CM | POA: Diagnosis not present

## 2024-06-17 DIAGNOSIS — N1832 Chronic kidney disease, stage 3b: Secondary | ICD-10-CM | POA: Diagnosis not present

## 2024-06-17 DIAGNOSIS — E1122 Type 2 diabetes mellitus with diabetic chronic kidney disease: Secondary | ICD-10-CM | POA: Diagnosis not present

## 2024-06-22 ENCOUNTER — Ambulatory Visit (INDEPENDENT_AMBULATORY_CARE_PROVIDER_SITE_OTHER)

## 2024-06-22 ENCOUNTER — Ambulatory Visit: Admitting: Podiatry

## 2024-06-22 VITALS — Ht 65.0 in | Wt 176.2 lb

## 2024-06-22 DIAGNOSIS — M7752 Other enthesopathy of left foot: Secondary | ICD-10-CM | POA: Diagnosis not present

## 2024-06-22 DIAGNOSIS — M19072 Primary osteoarthritis, left ankle and foot: Secondary | ICD-10-CM

## 2024-06-22 NOTE — Progress Notes (Signed)
  Subjective:  Patient ID: Felicia Shelton, female    DOB: [birth year],  MRN: [id number]  Chief Complaint  Patient presents with   Foot Pain    Rm 1 Patient is here for bump on right foot. Patient states after applying cortisone cream bump is no longer visible on top of right foot. Patient is concerned about left ankle swelling.    83 y.o. female presents with the above complaint. History confirmed with patient.   Objective:  Physical Exam: warm, good capillary refill, no trophic changes or ulcerative lesions, normal DP and PT pulses, normal sensory exam, and no notable right foot lesions, mild edema around midfoot and lateral ankle.   Radiographs: Multiple views x-ray of left foot: Degenerative TN and  joint Assessment:   1. Arthritis of left foot      Plan:  Patient was evaluated and treated and all questions answered.  Reviewed radiographs of the left foot showed the degenerative changes in the area anterior the ankle joint consistent with osteoarthritis we discussed this could be possible area of edema for her, as long as there is no pain open I recommend further treatment at this point she also has a previous TKA on the side that may contribute to peripheral edema as well compared to the contralateral limb.  Follow-up with me as needed if it becomes symptomatic  No follow-ups on file.

## 2024-06-30 ENCOUNTER — Ambulatory Visit: Payer: Medicare Other | Admitting: Podiatry

## 2024-06-30 ENCOUNTER — Encounter: Payer: Self-pay | Admitting: Podiatry

## 2024-06-30 DIAGNOSIS — M79675 Pain in left toe(s): Secondary | ICD-10-CM | POA: Diagnosis not present

## 2024-06-30 DIAGNOSIS — M2011 Hallux valgus (acquired), right foot: Secondary | ICD-10-CM

## 2024-06-30 DIAGNOSIS — M79674 Pain in right toe(s): Secondary | ICD-10-CM | POA: Diagnosis not present

## 2024-06-30 DIAGNOSIS — B351 Tinea unguium: Secondary | ICD-10-CM | POA: Diagnosis not present

## 2024-06-30 DIAGNOSIS — M2041 Other hammer toe(s) (acquired), right foot: Secondary | ICD-10-CM

## 2024-06-30 DIAGNOSIS — M2012 Hallux valgus (acquired), left foot: Secondary | ICD-10-CM

## 2024-06-30 DIAGNOSIS — E119 Type 2 diabetes mellitus without complications: Secondary | ICD-10-CM

## 2024-06-30 DIAGNOSIS — M2042 Other hammer toe(s) (acquired), left foot: Secondary | ICD-10-CM

## 2024-06-30 DIAGNOSIS — E1142 Type 2 diabetes mellitus with diabetic polyneuropathy: Secondary | ICD-10-CM | POA: Diagnosis not present

## 2024-07-01 DIAGNOSIS — H5203 Hypermetropia, bilateral: Secondary | ICD-10-CM | POA: Diagnosis not present

## 2024-07-05 ENCOUNTER — Encounter: Payer: Self-pay | Admitting: Podiatry

## 2024-07-05 NOTE — Progress Notes (Signed)
 ANNUAL DIABETIC FOOT EXAM  Subjective: Felicia Shelton presents today for annual diabetic foot exam. Chief Complaint  Patient presents with   Diabetes    [facility] NIDDM A1C 6.3. Toenail trim. LOV with PCP 07/03/24.   Patient confirms h/o diabetes.  Patient denies any h/o foot wounds.  Patient has been diagnosed with neuropathy.  Felicia Lamarr RAMAN, MD is patient's PCP.  Past Medical History:  Diagnosis Date   Allergy    Arthritis    back   Diabetes mellitus without complication (HCC)    type 2    GERD (gastroesophageal reflux disease)    Hx of adenomatous colonic polyp 05/26/2008   Hyperlipidemia    Hypertension    Neuromuscular disorder [facility])    Patient Active Problem List   Diagnosis Date Noted   Right lower lobe pneumonia 04/14/2024   Chronic kidney disease, stage 3b (HCC) 04/09/2024   Pure hypercholesterolemia 04/09/2024   Severe sepsis (HCC) 04/09/2024   Prolonged QT interval 04/09/2024   [place] 01/28/2023   Vasomotor rhinitis 04/23/2022   Gastroesophageal reflux disease 02/21/2022   Type 2 diabetes mellitus with hyperlipidemia (HCC) 05/26/2021   Primary osteoarthritis of left knee 04/17/2021   Primary localized osteoarthritis of left knee 04/17/2021   Radiculopathy 12/31/2018   Hypertension 08/04/2012   Hx of adenomatous colonic polyp 05/26/2008   Past Surgical History:  Procedure Laterality Date   ABDOMINAL HYSTERECTOMY     APPENDECTOMY     BREAST EXCISIONAL BIOPSY Left    BREAST SURGERY     left breast   CATARACT EXTRACTION Right 02/2020   COLONOSCOPY  10/11/2015   polyps 2009   JOINT REPLACEMENT     TONSILLECTOMY     removed age 80-5   TOTAL KNEE ARTHROPLASTY Left 04/17/2021   Procedure: LEFT TOTAL KNEE ARTHROPLASTY;  Surgeon: Sheril Coy, MD;  Location: [facility];  Service: Orthopedics;  Laterality: Left;   TRANSFORAMINAL LUMBAR INTERBODY FUSION (TLIF) WITH PEDICLE SCREW FIXATION 2 LEVEL Right 12/31/2018   Procedure: RIGHT - SIDED LUMBAR 2-3,LUMBAR  3-4, LUMBAR 4-5 TRANSFORAMINAL LUMBAR INTERBODY FUSION WITH INSTRUMENTATION AND ALLOGRAFT;  Surgeon: Beuford Anes, MD;  Location: [facility] OR;  Service: Orthopedics;  Laterality: Right;   Current Outpatient Medications on File Prior to Visit  Medication Sig Dispense Refill   acetaminophen  (TYLENOL ) 650 MG CR tablet Take 1,300 mg by mouth daily as needed for pain.     atorvastatin  (LIPITOR) 40 MG tablet Take 40 mg by mouth daily.     b complex vitamins capsule Take 1 capsule by mouth daily.     Cholecalciferol  (VITAMIN D-3) 5000 units TABS Take 5,000 Units by mouth every Monday, Wednesday, and Friday.      ezetimibe  (ZETIA ) 10 MG tablet Take 1 tablet (10 mg total) by mouth daily. 90 tablet 3   FARXIGA 10 MG TABS tablet Take 10 mg by mouth daily.     Homeopathic Products (THERAWORX RELIEF EX) Apply 1 application topically daily as needed (cramps).     lisinopril  (ZESTRIL ) 10 MG tablet Take 1 tablet (10 mg total) by mouth daily. 30 tablet 0   metFORMIN  (GLUCOPHAGE ) 500 MG tablet Take 500 mg by mouth 2 (two) times daily.     metoprolol  succinate (TOPROL -XL) 50 MG 24 hr tablet Take 1 tablet (50 mg total) by mouth daily. 90 tablet 3   Multiple Vitamins-Minerals (CENTRUM ADULT PO) Take 1 tablet by mouth daily at 12 noon.     No current facility-administered medications on file prior to visit.  No Known Allergies Social History   Occupational History   Not on file  Tobacco Use   Smoking status: Former    Current packs/day: 0.00    Types: Cigarettes    Quit date: 08/18/1970    Years since quitting: 53.9   Smokeless tobacco: Never  Vaping Use   Vaping status: Never Used  Substance and Sexual Activity   Alcohol use: Never    Alcohol/week: 0.0 standard drinks of alcohol   Drug use: No   Sexual activity: Not on file   Family History  Adopted: Yes  Problem Relation Age of Onset   Colon cancer Maternal Aunt    Esophageal cancer Neg Hx    Rectal cancer Neg Hx    Stomach cancer Neg Hx     Immunization History  Administered Date(s) Administered   Influenza, High Dose Seasonal PF 08/25/2014, 08/31/2015, 07/23/2017   Pneumococcal Conjugate-13 08/25/2014   Td 08/03/2012     Review of Systems: Negative except as noted in the HPI.   Objective: There were no vitals filed for this visit.  Felicia Shelton is a pleasant 83 y.o. female in NAD. AAO X 3.  Diabetic foot exam was performed with the following findings:   Normal sensation of 10g monofilament Vascular Examination: CFT <3 seconds b/l. DP/PT pulses faintly palpable b/l. Pedal edema absent. Skin temperature gradient warm to warm b/l. Digital hair absent. No pain with calf compression. No ischemia or gangrene. No cyanosis or clubbing noted b/l.    Neurological Examination: Sensation grossly intact b/l with 10 gram monofilament. Vibratory sensation intact b/l. Pt has subjective symptoms of neuropathy.  Dermatological Examination: Pedal skin warm and supple b/l.   No open wounds. No interdigital macerations.  Toenails 1-5 b/l thick, discolored, elongated with subungual debris and pain on dorsal palpation.    No hyperkeratotic nor porokeratotic lesions.  Musculoskeletal Examination: Muscle strength 5/5 to all lower extremity muscle groups bilaterally. HAV with bunion bilaterally and hammertoes 2-5 b/l. Asymptomatic juxtamalleolar lipomas noted anterolateral ankle b/l.  Radiographs: None      Lab Results  Component Value Date   HGBA1C 6.3 (H) 04/09/2024  ADA Risk Categorization: Low Risk :  Patient has all of the following: Intact protective sensation No prior foot ulcer  No severe deformity Pedal pulses present  Assessment: 1. Pain due to onychomycosis of toenails of both feet   2. Acquired hammertoes of both feet   3. Hallux valgus, acquired, bilateral   4. Diabetic polyneuropathy associated with type 2 diabetes mellitus (HCC)   5. Encounter for diabetic foot exam (HCC)     Plan: Diabetic foot  examination performed today.  All patient's and/or POA's questions/concerns addressed on today's visit. Mycotic toenails 1-5 debrided in length and girth without incident. Continue daily foot inspections and monitor blood glucose per PCP/Endocrinologist's recommendations. Continue soft, supportive shoe gear daily. Report any pedal injuries to medical professional. Call office if there are any questions/concerns. -Patient/POA to call should there be question/concern in the interim. Return in about 3 months (around 09/30/2024).  Delon LITTIE Merlin, DPM      [place] LOCATION: [address], [place] [address]  Office [phone]   [facility] LOCATION: [address] [place], [place] [address] Office [phone]

## 2024-07-08 DIAGNOSIS — H2512 Age-related nuclear cataract, left eye: Secondary | ICD-10-CM | POA: Diagnosis not present

## 2024-07-08 DIAGNOSIS — H25012 Cortical age-related cataract, left eye: Secondary | ICD-10-CM | POA: Diagnosis not present

## 2024-07-18 DIAGNOSIS — E1122 Type 2 diabetes mellitus with diabetic chronic kidney disease: Secondary | ICD-10-CM | POA: Diagnosis not present

## 2024-07-18 DIAGNOSIS — I10 Essential (primary) hypertension: Secondary | ICD-10-CM | POA: Diagnosis not present

## 2024-07-18 DIAGNOSIS — E7800 Pure hypercholesterolemia, unspecified: Secondary | ICD-10-CM | POA: Diagnosis not present

## 2024-07-18 DIAGNOSIS — N1832 Chronic kidney disease, stage 3b: Secondary | ICD-10-CM | POA: Diagnosis not present

## 2024-07-29 DIAGNOSIS — G629 Polyneuropathy, unspecified: Secondary | ICD-10-CM | POA: Diagnosis not present

## 2024-07-29 DIAGNOSIS — R052 Subacute cough: Secondary | ICD-10-CM | POA: Diagnosis not present

## 2024-08-03 DIAGNOSIS — K080 Exfoliation of teeth due to systemic causes: Secondary | ICD-10-CM | POA: Diagnosis not present

## 2024-08-10 DIAGNOSIS — H25812 Combined forms of age-related cataract, left eye: Secondary | ICD-10-CM | POA: Diagnosis not present

## 2024-08-10 DIAGNOSIS — H2512 Age-related nuclear cataract, left eye: Secondary | ICD-10-CM | POA: Diagnosis not present

## 2024-08-10 DIAGNOSIS — H25012 Cortical age-related cataract, left eye: Secondary | ICD-10-CM | POA: Diagnosis not present

## 2024-08-16 DIAGNOSIS — K080 Exfoliation of teeth due to systemic causes: Secondary | ICD-10-CM | POA: Diagnosis not present

## 2024-08-17 DIAGNOSIS — I10 Essential (primary) hypertension: Secondary | ICD-10-CM | POA: Diagnosis not present

## 2024-08-17 DIAGNOSIS — E1122 Type 2 diabetes mellitus with diabetic chronic kidney disease: Secondary | ICD-10-CM | POA: Diagnosis not present

## 2024-08-17 DIAGNOSIS — E7800 Pure hypercholesterolemia, unspecified: Secondary | ICD-10-CM | POA: Diagnosis not present

## 2024-08-17 DIAGNOSIS — N1832 Chronic kidney disease, stage 3b: Secondary | ICD-10-CM | POA: Diagnosis not present

## 2024-08-25 ENCOUNTER — Ambulatory Visit (HOSPITAL_COMMUNITY)
Admission: RE | Admit: 2024-08-25 | Discharge: 2024-08-25 | Disposition: A | Discharge status: Home / Self Care | Source: Ambulatory Visit | Attending: Cardiology | Admitting: Cardiology

## 2024-08-25 DIAGNOSIS — I10 Essential (primary) hypertension: Secondary | ICD-10-CM | POA: Diagnosis not present

## 2024-08-25 DIAGNOSIS — E7800 Pure hypercholesterolemia, unspecified: Secondary | ICD-10-CM | POA: Insufficient documentation

## 2024-08-25 DIAGNOSIS — I361 Nonrheumatic tricuspid (valve) insufficiency: Secondary | ICD-10-CM | POA: Diagnosis not present

## 2024-08-25 LAB — ECHOCARDIOGRAM COMPLETE
Area-P 1/2: 3.3 cm2
S' Lateral: 2.3 cm

## 2024-08-29 ENCOUNTER — Ambulatory Visit: Payer: Self-pay | Admitting: Cardiology

## 2024-08-29 NOTE — Progress Notes (Signed)
 Essentially normal echocardiogram with no change in mild to moderate tricuspid regurgitation, no evidence of pulm hypertension.  RV systolic pressure 31 mmHg.  No change from 08/21/2022.

## 2024-09-06 ENCOUNTER — Other Ambulatory Visit: Payer: Self-pay | Admitting: Family Medicine

## 2024-09-06 DIAGNOSIS — Z1231 Encounter for screening mammogram for malignant neoplasm of breast: Secondary | ICD-10-CM

## 2024-09-07 DIAGNOSIS — N1832 Chronic kidney disease, stage 3b: Secondary | ICD-10-CM | POA: Diagnosis not present

## 2024-09-07 DIAGNOSIS — N390 Urinary tract infection, site not specified: Secondary | ICD-10-CM | POA: Diagnosis not present

## 2024-09-15 DIAGNOSIS — N281 Cyst of kidney, acquired: Secondary | ICD-10-CM | POA: Diagnosis not present

## 2024-09-15 DIAGNOSIS — E1122 Type 2 diabetes mellitus with diabetic chronic kidney disease: Secondary | ICD-10-CM | POA: Diagnosis not present

## 2024-09-15 DIAGNOSIS — I129 Hypertensive chronic kidney disease with stage 1 through stage 4 chronic kidney disease, or unspecified chronic kidney disease: Secondary | ICD-10-CM | POA: Diagnosis not present

## 2024-09-15 DIAGNOSIS — N1832 Chronic kidney disease, stage 3b: Secondary | ICD-10-CM | POA: Diagnosis not present

## 2024-09-17 DIAGNOSIS — E1122 Type 2 diabetes mellitus with diabetic chronic kidney disease: Secondary | ICD-10-CM | POA: Diagnosis not present

## 2024-09-17 DIAGNOSIS — I10 Essential (primary) hypertension: Secondary | ICD-10-CM | POA: Diagnosis not present

## 2024-09-17 DIAGNOSIS — N1832 Chronic kidney disease, stage 3b: Secondary | ICD-10-CM | POA: Diagnosis not present

## 2024-09-17 DIAGNOSIS — E7800 Pure hypercholesterolemia, unspecified: Secondary | ICD-10-CM | POA: Diagnosis not present

## 2024-09-20 ENCOUNTER — Ambulatory Visit: Admitting: Podiatry

## 2024-09-20 ENCOUNTER — Encounter: Payer: Self-pay | Admitting: Podiatry

## 2024-09-20 DIAGNOSIS — M79675 Pain in left toe(s): Secondary | ICD-10-CM

## 2024-09-20 DIAGNOSIS — B351 Tinea unguium: Secondary | ICD-10-CM | POA: Diagnosis not present

## 2024-09-20 DIAGNOSIS — M79674 Pain in right toe(s): Secondary | ICD-10-CM | POA: Diagnosis not present

## 2024-09-20 NOTE — Progress Notes (Unsigned)
    Subjective:  Patient ID: Felicia Shelton, female    DOB: 04-Jul-1941,  MRN: [id number]  Felicia Shelton presents to clinic today for:  Chief Complaint  Patient presents with   Diabetes    [facility] NIDDM A1C 6.3. Toenail trim.    Patient notes nails are thick, discolored, elongated and painful in shoegear when trying to ambulate.  States her last A1c was 6.3.  She ambulates with the use of a cane for assistance.   PCP is Chrystal Lamarr RAMAN, MD.  Past Medical History:  Diagnosis Date   Allergy    Arthritis    back   Diabetes mellitus without complication (HCC)    type 2    GERD (gastroesophageal reflux disease)    Hx of adenomatous colonic polyp 05/26/2008   Hyperlipidemia    Hypertension    Neuromuscular disorder [facility])    Past Surgical History:  Procedure Laterality Date   ABDOMINAL HYSTERECTOMY     APPENDECTOMY     BREAST EXCISIONAL BIOPSY Left    BREAST SURGERY     left breast   CATARACT EXTRACTION Right 02/2020   COLONOSCOPY  10/11/2015   polyps 2009   JOINT REPLACEMENT     TONSILLECTOMY     removed age 67-5   TOTAL KNEE ARTHROPLASTY Left 04/17/2021   Procedure: LEFT TOTAL KNEE ARTHROPLASTY;  Surgeon: Sheril Coy, MD;  Location: [facility];  Service: Orthopedics;  Laterality: Left;   TRANSFORAMINAL LUMBAR INTERBODY FUSION (TLIF) WITH PEDICLE SCREW FIXATION 2 LEVEL Right 12/31/2018   Procedure: RIGHT - SIDED LUMBAR 2-3,LUMBAR 3-4, LUMBAR 4-5 TRANSFORAMINAL LUMBAR INTERBODY FUSION WITH INSTRUMENTATION AND ALLOGRAFT;  Surgeon: Beuford Anes, MD;  Location: [facility] OR;  Service: Orthopedics;  Laterality: Right;   No Known Allergies  Review of Systems: Negative except as noted in the HPI.  Objective:  Felicia STIVERSON is a pleasant 83 y.o. female in NAD. AAO x 3.  Vascular Examination: Capillary refill time is 3-5 seconds to toes bilateral. Palpable pedal pulses b/l LE. Digital hair present b/l.  Skin temperature gradient WNL b/l. No varicosities b/l. No cyanosis noted b/l.    Dermatological Examination: Pedal skin with normal turgor, texture and tone b/l. No open wounds. No interdigital macerations b/l. Toenails x10 are 3mm thick, discolored, dystrophic with subungual debris. There is pain with compression of the nail plates.  They are elongated x10  Assessment/Plan: 1. Pain due to onychomycosis of toenails of both feet    The mycotic toenails were sharply debrided x10 with sterile nail nippers and a power debriding burr to decrease bulk/thickness and length.    F/u 3 months   Laqueshia Cihlar DSABRA Imperial, DPM, FACFAS [facility]     [address] [place], [place] [address]                Office [phone]  Fax [phone]

## 2024-10-07 ENCOUNTER — Ambulatory Visit
Admission: RE | Admit: 2024-10-07 | Discharge: 2024-10-07 | Disposition: A | Discharge status: Home / Self Care | Source: Ambulatory Visit | Attending: Family Medicine | Admitting: Family Medicine

## 2024-10-07 DIAGNOSIS — Z1231 Encounter for screening mammogram for malignant neoplasm of breast: Secondary | ICD-10-CM

## 2024-10-13 ENCOUNTER — Other Ambulatory Visit: Payer: Self-pay | Admitting: Family Medicine

## 2024-10-13 DIAGNOSIS — R928 Other abnormal and inconclusive findings on diagnostic imaging of breast: Secondary | ICD-10-CM

## 2024-10-17 DIAGNOSIS — N1832 Chronic kidney disease, stage 3b: Secondary | ICD-10-CM | POA: Diagnosis not present

## 2024-10-17 DIAGNOSIS — E1122 Type 2 diabetes mellitus with diabetic chronic kidney disease: Secondary | ICD-10-CM | POA: Diagnosis not present

## 2024-10-17 DIAGNOSIS — I10 Essential (primary) hypertension: Secondary | ICD-10-CM | POA: Diagnosis not present

## 2024-10-17 DIAGNOSIS — E7800 Pure hypercholesterolemia, unspecified: Secondary | ICD-10-CM | POA: Diagnosis not present

## 2024-10-23 ENCOUNTER — Ambulatory Visit
Admission: RE | Admit: 2024-10-23 | Discharge: 2024-10-23 | Disposition: A | Source: Ambulatory Visit | Attending: Family Medicine

## 2024-10-23 DIAGNOSIS — R928 Other abnormal and inconclusive findings on diagnostic imaging of breast: Secondary | ICD-10-CM

## 2024-10-23 DIAGNOSIS — R921 Mammographic calcification found on diagnostic imaging of breast: Secondary | ICD-10-CM | POA: Diagnosis not present

## 2024-10-25 ENCOUNTER — Other Ambulatory Visit: Payer: Self-pay | Admitting: Family Medicine

## 2024-10-25 DIAGNOSIS — R921 Mammographic calcification found on diagnostic imaging of breast: Secondary | ICD-10-CM

## 2024-10-27 DIAGNOSIS — E7800 Pure hypercholesterolemia, unspecified: Secondary | ICD-10-CM | POA: Diagnosis not present

## 2024-10-27 DIAGNOSIS — Z0000 Encounter for general adult medical examination without abnormal findings: Secondary | ICD-10-CM | POA: Diagnosis not present

## 2024-10-27 DIAGNOSIS — N1832 Chronic kidney disease, stage 3b: Secondary | ICD-10-CM | POA: Diagnosis not present

## 2024-10-27 DIAGNOSIS — I10 Essential (primary) hypertension: Secondary | ICD-10-CM | POA: Diagnosis not present

## 2024-10-27 DIAGNOSIS — E1122 Type 2 diabetes mellitus with diabetic chronic kidney disease: Secondary | ICD-10-CM | POA: Diagnosis not present

## 2024-11-08 ENCOUNTER — Inpatient Hospital Stay: Admission: RE | Admit: 2024-11-08 | Discharge: 2024-11-08 | Attending: Family Medicine | Admitting: Family Medicine

## 2024-11-08 ENCOUNTER — Other Ambulatory Visit: Payer: Self-pay | Admitting: Family Medicine

## 2024-11-08 ENCOUNTER — Ambulatory Visit
Admission: RE | Admit: 2024-11-08 | Discharge: 2024-11-08 | Disposition: A | Source: Ambulatory Visit | Attending: Family Medicine

## 2024-11-08 ENCOUNTER — Ambulatory Visit
Admission: RE | Admit: 2024-11-08 | Discharge: 2024-11-08 | Disposition: A | Source: Ambulatory Visit | Attending: Family Medicine | Admitting: Family Medicine

## 2024-11-08 DIAGNOSIS — R921 Mammographic calcification found on diagnostic imaging of breast: Secondary | ICD-10-CM

## 2024-11-08 HISTORY — PX: BREAST BIOPSY: SHX20

## 2024-11-12 LAB — SURGICAL PATHOLOGY

## 2024-11-22 NOTE — Progress Notes (Incomplete)
 " Radiation Oncology         [phone] ________________________________  Name: Felicia Shelton        MRN: [id number]  Date of Service: 11/24/2024 DOB: 08-24-1941  RR:[id number], Lamarr RAMAN, MD  Ebbie Cough, MD     REFERRING PHYSICIAN: Ebbie Cough, MD   DIAGNOSIS: There were no encounter diagnoses.***   HISTORY OF PRESENT ILLNESS: Felicia Shelton is a 84 y.o. female seen in the multidisciplinary breast clinic for a new clinical diagnosis of right breast cancer. The patient had a screening detected area of calcification in the right breast on screening mammogram on 10/07/2024.  No abnormalities were seen in the left breast.  She returned for diagnostic mammogram on 10/23/2024 which showed a 3 cm group of calcifications in the lower outer quadrant as well as a 2 mm group of calcifications in the upper inner quadrant.  She underwent stereotactic biopsy on 11/08/2024 2 biopsies of the lower outer quadrant calcifications were performed, benign breast tissue with fibrocystic change was noted in the ribbon clip biopsy and sclerosing adenosis with calcifications atypical ductal hyperplasia was also seen with the Venus clip specimen.  The upper inner quadrant specimen however representing the 2 mm group of calcifications mammographically showed atypical ductal hyperplasia involving an intraductal papilloma bordering on DCIS with necrosis and calcifications, her specimen was submitted for prognostic testing and showed the cells to be ER/PR positive.  She is seen to discuss treatment recommendations of what is anticipated to be DCIS.    PREVIOUS RADIATION THERAPY: {EXAM; YES/NO:19492::No}   PAST MEDICAL HISTORY:  Past Medical History:  Diagnosis Date   Allergy    Arthritis    back   Diabetes mellitus without complication (HCC)    type 2    GERD (gastroesophageal reflux disease)    Hx of adenomatous colonic polyp 05/26/2008   Hyperlipidemia    Hypertension    Neuromuscular disorder (HCC)         PAST SURGICAL HISTORY: Past Surgical History:  Procedure Laterality Date   ABDOMINAL HYSTERECTOMY     APPENDECTOMY     BREAST BIOPSY Right 11/08/2024   MM RT BREAST BX W LOC DEV 1ST LESION IMAGE BX SPEC STEREO GUIDE 11/08/2024 [facility]   BREAST BIOPSY Right 11/08/2024   MM RT BREAST BX W LOC DEV EA AD LESION IMG BX SPEC STEREO GUIDE 11/08/2024 [facility]   BREAST BIOPSY Right 11/08/2024   MM RT BREAST BX W LOC DEV EA AD LESION IMG BX SPEC STEREO GUIDE 11/08/2024 [facility]   BREAST EXCISIONAL BIOPSY Left    BREAST SURGERY     left breast   CATARACT EXTRACTION Right 02/2020   COLONOSCOPY  10/11/2015   polyps 2009   JOINT REPLACEMENT     TONSILLECTOMY     removed age 62-5   TOTAL KNEE ARTHROPLASTY Left 04/17/2021   Procedure: LEFT TOTAL KNEE ARTHROPLASTY;  Surgeon: Sheril Coy, MD;  Location: [facility];  Service: Orthopedics;  Laterality: Left;   TRANSFORAMINAL LUMBAR INTERBODY FUSION (TLIF) WITH PEDICLE SCREW FIXATION 2 LEVEL Right 12/31/2018   Procedure: RIGHT - SIDED LUMBAR 2-3,LUMBAR 3-4, LUMBAR 4-5 TRANSFORAMINAL LUMBAR INTERBODY FUSION WITH INSTRUMENTATION AND ALLOGRAFT;  Surgeon: Beuford Anes, MD;  Location: [facility];  Service: Orthopedics;  Laterality: Right;     FAMILY HISTORY:  Family History  Adopted: Yes  Problem Relation Age of Onset   Colon cancer Maternal Aunt    Esophageal cancer Neg Hx    Rectal cancer Neg Hx  Stomach cancer Neg Hx    Breast cancer Neg Hx      SOCIAL HISTORY:  reports that she quit smoking about 54 years ago. Her smoking use included cigarettes. She has never used smokeless tobacco. She reports that she does not drink alcohol and does not use drugs. The patient is married and lives in [place]. She ***   ALLERGIES: Patient has no known allergies.   MEDICATIONS:  Current Outpatient Medications  Medication Sig Dispense Refill   acetaminophen  (TYLENOL ) 650 MG CR tablet Take 1,300 mg by mouth daily as  needed for pain.     atorvastatin  (LIPITOR) 40 MG tablet Take 40 mg by mouth daily.     b complex vitamins capsule Take 1 capsule by mouth daily.     Cholecalciferol  (VITAMIN D-3) 5000 units TABS Take 5,000 Units by mouth every Monday, Wednesday, and Friday.      ezetimibe  (ZETIA ) 10 MG tablet Take 1 tablet (10 mg total) by mouth daily. 90 tablet 3   FARXIGA 10 MG TABS tablet Take 10 mg by mouth daily.     Homeopathic Products (THERAWORX RELIEF EX) Apply 1 application topically daily as needed (cramps).     lisinopril  (ZESTRIL ) 10 MG tablet Take 1 tablet (10 mg total) by mouth daily. 30 tablet 0   metFORMIN  (GLUCOPHAGE ) 500 MG tablet Take 500 mg by mouth 2 (two) times daily.     metoprolol  succinate (TOPROL -XL) 50 MG 24 hr tablet Take 1 tablet (50 mg total) by mouth daily. 90 tablet 3   Multiple Vitamins-Minerals (CENTRUM ADULT PO) Take 1 tablet by mouth daily at 12 noon.     No current facility-administered medications for this visit.     REVIEW OF SYSTEMS: On review of systems, the patient reports that she is doing ***     PHYSICAL EXAM:  Wt Readings from Last 3 Encounters:  06/22/24 176 lb 2.4 oz (79.9 kg)  04/14/24 176 lb 2.4 oz (79.9 kg)  03/11/24 170 lb (77.1 kg)   Temp Readings from Last 3 Encounters:  04/14/24 98.7 F (37.1 C) (Oral)  09/26/22 (!) 96 F (35.6 C) (Temporal)  09/04/22 (!) 96 F (35.6 C) (Temporal)   BP Readings from Last 3 Encounters:  04/14/24 (!) 152/81  03/11/24 126/80  06/03/23 (!) 169/71   Pulse Readings from Last 3 Encounters:  04/14/24 88  03/11/24 91  10/16/22 96    In general this is a well appearing *** female in no acute distress. She's alert and oriented x4 and appropriate throughout the examination. Cardiopulmonary assessment is negative for acute distress and she exhibits normal effort. Bilateral breast exam is deferred.    ECOG = ***  0 - Asymptomatic (Fully active, able to carry on all predisease activities without  restriction)  1 - Symptomatic but completely ambulatory (Restricted in physically strenuous activity but ambulatory and able to carry out work of a light or sedentary nature. For example, light housework, office work)  2 - Symptomatic, <50% in bed during the day (Ambulatory and capable of all self care but unable to carry out any work activities. Up and about more than 50% of waking hours)  3 - Symptomatic, >50% in bed, but not bedbound (Capable of only limited self-care, confined to bed or chair 50% or more of waking hours)  4 - Bedbound (Completely disabled. Cannot carry on any self-care. Totally confined to bed or chair)  5 - Death   Raylene MM, Creech RH, Tormey DC, et al. [phone]).  Toxicity and response criteria of the [facility]. Am. DOROTHA Bridges. Oncol. 5 (6): 649-55    LABORATORY DATA:  Lab Results  Component Value Date   WBC 11.5 (H) 04/11/2024   HGB 12.1 04/11/2024   HCT 35.7 (L) 04/11/2024   MCV 79.3 (L) 04/11/2024   PLT 205 04/11/2024   Lab Results  Component Value Date   NA 139 04/14/2024   K 3.7 04/14/2024   CL 103 04/14/2024   CO2 27 04/14/2024   Lab Results  Component Value Date   ALT 55 (H) 04/11/2024   AST 101 (H) 04/11/2024   ALKPHOS 66 04/11/2024   BILITOT 0.9 04/11/2024      RADIOGRAPHY: MM RT BREAST BX W LOC DEV 1ST LESION IMAGE BX SPEC STEREO GUIDE Addendum Date: 11/13/2024 ADDENDUM REPORT: 11/13/2024 13:19 ADDENDUM: PATHOLOGY revealed: Site 1. Breast, right, needle core biopsy, 0.3 cm group of calcification - lower outer quadrant - clip: ribbon- BENIGN BREAST TISSUE WITH FIBROCYSTIC CHANGES, APOCRINE METAPLASIA AND CALCIFICATIONS. NEGATIVE FOR ATYPIA OR MALIGNANCY. Pathology results are CONCORDANT with imaging findings, per Dr. Aliene Mir. PATHOLOGY revealed: Site 2. 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 Pathology results are CONCORDANT with imaging findings, per Dr. Aliene Mir. PATHOLOGY revealed: Site 3. Breast, right, needle core biopsy, 0.3 cm group of calcifications - lower outer quadrant - clip: venus- SCLEROSING ADENOSIS WITH CALCIFICATIONS. ATYPICAL DUCTAL HYPERPLASIA. NO DEFINITIVE EVIDENCE OF MALIGNANCY. Pathology results are CONCORDANT with imaging findings, per Dr. Aliene Mir. Pathology results and recommendations below were discussed with patient by telephone. Patient reported biopsy site within normal limits with slight tenderness at the site. Post biopsy care instructions were reviewed, questions were answered and my direct phone number was provided to patient. Patient was instructed to call [facility] if any concerns or questions arise related to the biopsy. RECOMMENDATION: Surgical and oncological consultation. The patient was referred to [facility] with appointment on 11/24/24. Pathology results reported by Mliss CHARM Molt RN 11/12/2024. Electronically Signed   By: Aliene Lloyd M.D.   On: 11/13/2024 13:19   Result Date: 11/13/2024 CLINICAL DATA:  84 year old woman presented for stereotactic guided biopsy of 0.3 cm group of lower outer and 0.2 cm group of upper inner RIGHT breast calcifications. EXAM: RIGHT BREAST STEREOTACTIC CORE NEEDLE BIOPSY (x2) COMPARISON:  Previous exam(s). FINDINGS: The patient and I discussed the procedure of stereotactic-guided biopsy including benefits and alternatives. We discussed the high likelihood of a successful procedure. We discussed the risks of the procedure including infection, bleeding, tissue injury, clip migration, and inadequate sampling. Informed written consent was given. The usual time out protocol was performed immediately prior to the procedure.  ________________________________________________________________ SITE 1: 0.3 CM GROUP OF RIGHT LOWER OUTER BREAST CALCIFICATIONS Using sterile technique and 1% Lidocaine  as local anesthetic, under stereotactic guidance, a 9 gauge vacuum assisted device was used to perform core needle biopsy of calcifications in the lower outer quadrant of the RIGHT breast using a medial approach. No calcifications could be identified within the obtained samples. Decision was made to attempt the biopsy from a lateral approach. Prior to changing to lateral approach, ribbon shaped clip was deployed into the biopsy cavity. Using sterile technique and 1% Lidocaine  as local anesthetic, under stereotactic guidance, a  9 gauge vacuum assisted device was used to perform core needle biopsy of calcifications in the lower outer quadrant of the RIGHT breast using a lateral approach. Specimen radiograph was performed showing calcifications. Specimens with calcifications are identified for pathology. Lesion quadrant: Lower outer quadrant At the conclusion of the procedure, venus shaped tissue marker clip was deployed into the biopsy cavity. Follow-up 2-view mammogram was performed and dictated separately. _____________________________________________________________________ SITE 2: 0.2 CM GROUP OF RIGHT UPPER INNER BREAST CALCIFICATIONS Using sterile technique and 1% Lidocaine  as local anesthetic, under stereotactic guidance, a 9 gauge vacuum assisted device was used to perform core needle biopsy of calcifications in the upper inner quadrant of the RIGHT breast using a medial approach. Specimen radiograph was performed showing calcifications. Specimens with calcifications are identified for pathology. Lesion quadrant: Upper inner quadrant At the conclusion of the procedure, coil shaped tissue marker clip was deployed into the biopsy cavity. Follow-up 2-view mammogram was performed and dictated separately. IMPRESSION: Stereotactic-guided biopsy of  0.3 cm group of lower outer and 0.2 cm group of upper inner RIGHT breast calcifications. No apparent complications. Electronically Signed: By: Aliene Lloyd M.D. On: 11/08/2024 15:39   MM RT BREAST BX W LOC DEV EA AD LESION IMG BX SPEC STEREO GUIDE Addendum Date: 11/13/2024 ADDENDUM REPORT: 11/13/2024 13:19 ADDENDUM: PATHOLOGY revealed: Site 1. Breast, right, needle core biopsy, 0.3 cm group of calcification - lower outer quadrant - clip: ribbon- BENIGN BREAST TISSUE WITH FIBROCYSTIC CHANGES, APOCRINE METAPLASIA AND CALCIFICATIONS. NEGATIVE FOR ATYPIA OR MALIGNANCY. Pathology results are CONCORDANT with imaging findings, per Dr. Aliene Mir. PATHOLOGY revealed: Site 2. 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 Pathology results are CONCORDANT with imaging findings, per Dr. Aliene Mir. PATHOLOGY revealed: Site 3. Breast, right, needle core biopsy, 0.3 cm group of calcifications - lower outer quadrant - clip: venus- SCLEROSING ADENOSIS WITH CALCIFICATIONS. ATYPICAL DUCTAL HYPERPLASIA. NO DEFINITIVE EVIDENCE OF MALIGNANCY. Pathology results are CONCORDANT with imaging findings, per Dr. Aliene Mir. Pathology results and recommendations below were discussed with patient by telephone. Patient reported biopsy site within normal limits with slight tenderness at the site. Post biopsy care instructions were reviewed, questions were answered and my direct phone number was provided to patient. Patient was instructed to call [facility] if any concerns or questions arise related to the biopsy. RECOMMENDATION: Surgical and oncological consultation. The patient was referred to [facility] with appointment on 11/24/24. Pathology  results reported by Mliss CHARM Molt RN 11/12/2024. Electronically Signed   By: Aliene Lloyd M.D.   On: 11/13/2024 13:19   Result Date: 11/13/2024 CLINICAL DATA:  84 year old woman presented for stereotactic guided biopsy of 0.3 cm group of lower outer and 0.2 cm group of upper inner RIGHT breast calcifications. EXAM: RIGHT BREAST STEREOTACTIC CORE NEEDLE BIOPSY (x2) COMPARISON:  Previous exam(s). FINDINGS: The patient and I discussed the procedure of stereotactic-guided biopsy including benefits and alternatives. We discussed the high likelihood of a successful procedure. We discussed the risks of the procedure including infection, bleeding, tissue injury, clip migration, and inadequate sampling. Informed written consent was given. The usual time out protocol was performed immediately prior to the procedure. ________________________________________________________________ SITE 1: 0.3 CM GROUP OF RIGHT LOWER OUTER BREAST CALCIFICATIONS Using sterile technique and 1% Lidocaine  as local  anesthetic, under stereotactic guidance, a 9 gauge vacuum assisted device was used to perform core needle biopsy of calcifications in the lower outer quadrant of the RIGHT breast using a medial approach. No calcifications could be identified within the obtained samples. Decision was made to attempt the biopsy from a lateral approach. Prior to changing to lateral approach, ribbon shaped clip was deployed into the biopsy cavity. Using sterile technique and 1% Lidocaine  as local anesthetic, under stereotactic guidance, a 9 gauge vacuum assisted device was used to perform core needle biopsy of calcifications in the lower outer quadrant of the RIGHT breast using a lateral approach. Specimen radiograph was performed showing calcifications. Specimens with calcifications are identified for pathology. Lesion quadrant: Lower outer quadrant At the conclusion of the procedure, venus shaped tissue marker clip was deployed into the biopsy cavity.  Follow-up 2-view mammogram was performed and dictated separately. _____________________________________________________________________ SITE 2: 0.2 CM GROUP OF RIGHT UPPER INNER BREAST CALCIFICATIONS Using sterile technique and 1% Lidocaine  as local anesthetic, under stereotactic guidance, a 9 gauge vacuum assisted device was used to perform core needle biopsy of calcifications in the upper inner quadrant of the RIGHT breast using a medial approach. Specimen radiograph was performed showing calcifications. Specimens with calcifications are identified for pathology. Lesion quadrant: Upper inner quadrant At the conclusion of the procedure, coil shaped tissue marker clip was deployed into the biopsy cavity. Follow-up 2-view mammogram was performed and dictated separately. IMPRESSION: Stereotactic-guided biopsy of 0.3 cm group of lower outer and 0.2 cm group of upper inner RIGHT breast calcifications. No apparent complications. Electronically Signed: By: Aliene Lloyd M.D. On: 11/08/2024 15:39   MM RT BREAST BX W LOC DEV EA AD LESION IMG BX SPEC STEREO GUIDE Addendum Date: 11/13/2024 ADDENDUM REPORT: 11/13/2024 13:19 ADDENDUM: PATHOLOGY revealed: Site 1. Breast, right, needle core biopsy, 0.3 cm group of calcification - lower outer quadrant - clip: ribbon- BENIGN BREAST TISSUE WITH FIBROCYSTIC CHANGES, APOCRINE METAPLASIA AND CALCIFICATIONS. NEGATIVE FOR ATYPIA OR MALIGNANCY. Pathology results are CONCORDANT with imaging findings, per Dr. Aliene Mir. PATHOLOGY revealed: Site 2. 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 Pathology results are CONCORDANT with imaging findings, per Dr. Aliene Mir. PATHOLOGY revealed: Site 3. Breast, right, needle core biopsy, 0.3 cm group of calcifications - lower outer quadrant -  clip: venus- SCLEROSING ADENOSIS WITH CALCIFICATIONS. ATYPICAL DUCTAL HYPERPLASIA. NO DEFINITIVE EVIDENCE OF MALIGNANCY. Pathology results are CONCORDANT with imaging findings, per Dr. Aliene Mir. Pathology results and recommendations below were discussed with patient by telephone. Patient reported biopsy site within normal limits with slight tenderness at the site. Post biopsy care instructions were reviewed, questions were answered and my direct phone number was provided to patient. Patient was instructed to call [facility] if any concerns or questions arise related to the biopsy. RECOMMENDATION: Surgical and oncological consultation. The patient was referred to [facility] with appointment on 11/24/24. Pathology results reported by Mliss CHARM Molt RN 11/12/2024. Electronically Signed   By: Aliene Lloyd M.D.   On: 11/13/2024 13:19   Result Date: 11/13/2024 CLINICAL DATA:  84 year old woman presented for stereotactic guided biopsy of 0.3 cm group of lower outer and 0.2 cm group of upper inner RIGHT breast calcifications. EXAM: RIGHT BREAST STEREOTACTIC CORE NEEDLE BIOPSY (x2) COMPARISON:  Previous exam(s). FINDINGS: The patient and I discussed the procedure of stereotactic-guided biopsy including benefits and alternatives. We discussed the high likelihood of a successful procedure. We discussed the risks of the procedure including infection, bleeding, tissue injury, clip migration, and inadequate sampling. Informed written consent was given. The usual time out protocol was performed immediately prior to the procedure. ________________________________________________________________ SITE 1: 0.3 CM GROUP OF RIGHT LOWER OUTER BREAST CALCIFICATIONS Using sterile technique and 1% Lidocaine  as local anesthetic, under stereotactic guidance, a 9 gauge vacuum assisted device was used to perform core needle biopsy of  calcifications in the lower outer quadrant of the RIGHT breast using a medial approach. No calcifications could be identified within the obtained samples. Decision was made to attempt the biopsy from a lateral approach. Prior to changing to lateral approach, ribbon shaped clip was deployed into the biopsy cavity. Using sterile technique and 1% Lidocaine  as local anesthetic, under stereotactic guidance, a 9 gauge vacuum assisted device was used to perform core needle biopsy of calcifications in the lower outer quadrant of the RIGHT breast using a lateral approach. Specimen radiograph was performed showing calcifications. Specimens with calcifications are identified for pathology. Lesion quadrant: Lower outer quadrant At the conclusion of the procedure, venus shaped tissue marker clip was deployed into the biopsy cavity. Follow-up 2-view mammogram was performed and dictated separately. _____________________________________________________________________ SITE 2: 0.2 CM GROUP OF RIGHT UPPER INNER BREAST CALCIFICATIONS Using sterile technique and 1% Lidocaine  as local anesthetic, under stereotactic guidance, a 9 gauge vacuum assisted device was used to perform core needle biopsy of calcifications in the upper inner quadrant of the RIGHT breast using a medial approach. Specimen radiograph was performed showing calcifications. Specimens with calcifications are identified for pathology. Lesion quadrant: Upper inner quadrant At the conclusion of the procedure, coil shaped tissue marker clip was deployed into the biopsy cavity. Follow-up 2-view mammogram was performed and dictated separately. IMPRESSION: Stereotactic-guided biopsy of 0.3 cm group of lower outer and 0.2 cm group of upper inner RIGHT breast calcifications. No apparent complications. Electronically Signed: By: Aliene Lloyd M.D. On: 11/08/2024 15:39   MM CLIP PLACEMENT RIGHT Result Date: 11/08/2024 CLINICAL DATA:  Status post stereotactic guided biopsy of  RIGHT breast EXAM: 3D DIAGNOSTIC RIGHT MAMMOGRAM POST STEREOTACTIC BIOPSY (x2) COMPARISON:  Previous exam(s). ACR Breast Density Category b: There are scattered areas of fibroglandular density. FINDINGS: 3D Mammographic images were obtained following stereotactic guided biopsy of RIGHT breast calcifications. Coil shaped biopsy marking clip is appropriately positioned in the upper inner RIGHT breast. Venus shaped biopsy marking clip is located approximately 1.7 cm medial to the targeted calcifications. Ribbon shaped biopsy clip is located approximately 2.9 cm medial to the targeted calcifications. Both of these clips were placed after attempting to biopsy the same target calcification. IMPRESSION: 1. Appropriate positioning of the coil shaped biopsy marking clip at the site of biopsy in the upper-inner RIGHT breast. 2. Venus shaped biopsy marking clip is located approximately 1.7 cm medial to the targeted calcifications. Ribbon shaped biopsy clip is located approximately 2.9 cm medial to the targeted calcifications. Both of these clips were placed after attempting to biopsy the same target calcification in the lower outer quadrant of the RIGHT breast. Final Assessment: Post Procedure Mammograms for Marker Placement Electronically Signed   By: Aliene Lloyd M.D.   On: 11/08/2024 15:46   MM Digital Diagnostic Unilat R Result Date: 10/23/2024 CLINICAL DATA:  84 year old woman recalled for RIGHT breast calcifications. EXAM: DIGITAL DIAGNOSTIC UNILATERAL RIGHT MAMMOGRAM TECHNIQUE: Right  digital diagnostic mammography was performed. COMPARISON:  Previous exam(s). ACR Breast Density Category b: There are scattered areas of fibroglandular density. FINDINGS: RIGHT: Mammogram: Spot magnification ML and CC and full field ML views of the RIGHT breast were obtained. 0.3 cm group of coarse heterogeneous and amorphous calcifications are seen in the middle depth of the lower outer RIGHT breast. 0.2 cm group of coarse heterogeneous  calcifications are seen in the middle depth of the upper inner RIGHT breast. IMPRESSION: 1. Indeterminate 0.3 cm group of calcifications seen in the lower outer quadrant of the RIGHT breast. 2. Indeterminate 0.2 cm group of calcifications seen in the upper inner quadrant of the RIGHT breast. RECOMMENDATION: 1. Stereotactic guided biopsy of 0.3 cm group of RIGHT lower outer breast calcifications. 2. Stereotactic guided biopsy of 0.2 cm group of RIGHT upper inner breast calcifications. I have discussed the findings and recommendations with the patient. The biopsy procedure was explained to the patient and questions were answered. Patient expressed their understanding of the biopsy recommendation. Patient will be scheduled for biopsy at her earliest convenience by the schedulers. Ordering provider will be notified. If applicable, a reminder letter will be sent to the patient regarding the next appointment. BI-RADS CATEGORY  4: Suspicious. Electronically Signed   By: Aliene Lloyd M.D.   On: 10/23/2024 12:53       IMPRESSION/PLAN: 1. Atypical Ductal Hyperplasia with features suspicious for ER/PR positive DCIS of the right breast. Dr. Dewey discusses the pathology findings and reviews the nature of early-stage breast disease. The consensus from the breast conference includes breast conservation with lumpectomy.  Dr. Dewey discusses that if cancer is confirmed, standard of care would include external radiotherapy to the breast  to reduce risks of local recurrence. Dr. Dewey also discusses cases in which radiation may be optional for favorable cases based on final pathology.  We will follow-up with her final results of surgery, but if recommended Dr. Dewey would anticipate a course of up to 4 weeks of radiotherapy to the right breast. Dr. Odean anticipates adjuvant antiestrogen therapy to follow.  We will see her back a few weeks after surgery to discuss the pathology and review her decisions regarding treatment and  if electing to proceed, begin simulation process.     In a visit lasting *** minutes, greater than 50% of the time was spent face to face reviewing her case, as well as in preparation of, discussing, and coordinating the patient's care.  The above documentation reflects my direct findings during this shared patient visit. Please see the separate note by Dr. Dewey on this date for the remainder of the patient's plan of care.    Donald KYM Husband, [facility]    **Disclaimer: This note was dictated with voice recognition software. Similar sounding words can inadvertently be transcribed and this note may contain transcription errors which may not have been corrected upon publication of note.** "

## 2024-11-23 ENCOUNTER — Ambulatory Visit: Admitting: Diagnostic Neuroimaging

## 2024-11-23 ENCOUNTER — Telehealth: Payer: Self-pay | Admitting: *Deleted

## 2024-11-23 ENCOUNTER — Encounter: Payer: Self-pay | Admitting: Diagnostic Neuroimaging

## 2024-11-23 VITALS — BP 140/88 | HR 88 | Ht 66.0 in | Wt 177.6 lb

## 2024-11-23 DIAGNOSIS — E1142 Type 2 diabetes mellitus with diabetic polyneuropathy: Secondary | ICD-10-CM

## 2024-11-23 DIAGNOSIS — Z7984 Long term (current) use of oral hypoglycemic drugs: Secondary | ICD-10-CM

## 2024-11-23 NOTE — Patient Instructions (Signed)
" °  Mild diabetic neuropathy - stable; continue diabetes control - continue gabapentin  300mg  twice a day for neuropathy pain - consider gait and balance exercises (via PT, [employer], home exercises) "

## 2024-11-23 NOTE — Progress Notes (Signed)
 "  [facility]  PATIENT: Felicia Shelton DOB: 1941-01-15  REFERRING CLINICIAN: Chrystal Lamarr RAMAN, MD HISTORY FROM: patient REASON FOR VISIT: new consult   HISTORICAL  CHIEF COMPLAINT:  Chief Complaint  Patient presents with   RM 7     Patient is here alone for Peripheral neuropathy - mainly in her feet and now her hands     HISTORY OF PRESENT ILLNESS:   84 year old female here for evaluation of neuropathy.  At 84 years old patient was diagnosed with diabetes with A1c around 7.0.  From age 39 years old she started to have burning sensation in the feet.  Also had progressive gait and balance difficulty.  A1c has improved.  She has been on gabapentin  300 mg twice a day with mild relief. Some mild symptoms in hands.   REVIEW OF SYSTEMS: Full 14 system review of systems performed and negative with exception of: as per HPI.  ALLERGIES: Allergies[1]  HOME MEDICATIONS: Outpatient Medications Prior to Visit  Medication Sig Dispense Refill   acetaminophen  (TYLENOL ) 650 MG CR tablet Take 1,300 mg by mouth daily as needed for pain.     amLODipine  (NORVASC ) 10 MG tablet Take 10 mg by mouth daily.     atorvastatin  (LIPITOR) 40 MG tablet Take 40 mg by mouth daily.     Cholecalciferol  (VITAMIN D-3) 5000 units TABS Take 5,000 Units by mouth every Monday, Wednesday, and Friday.      ezetimibe  (ZETIA ) 10 MG tablet Take 1 tablet (10 mg total) by mouth daily. 90 tablet 3   FARXIGA 10 MG TABS tablet Take 10 mg by mouth daily.     gabapentin  (NEURONTIN ) 300 MG capsule 1 capsule.     Homeopathic Products (THERAWORX RELIEF EX) Apply 1 application topically daily as needed (cramps).     lisinopril  (ZESTRIL ) 10 MG tablet Take 1 tablet (10 mg total) by mouth daily. (Patient taking differently: Take 20 mg by mouth daily.) 30 tablet 0   metFORMIN  (GLUCOPHAGE ) 500 MG tablet Take 500 mg by mouth 2 (two) times daily.     metoprolol  succinate (TOPROL -XL) 50 MG 24 hr tablet Take 1 tablet  (50 mg total) by mouth daily. 90 tablet 3   Multiple Vitamins-Minerals (CENTRUM ADULT PO) Take 1 tablet by mouth daily at 12 noon.     b complex vitamins capsule Take 1 capsule by mouth daily. (Patient not taking: Reported on 11/23/2024)     No facility-administered medications prior to visit.    PAST MEDICAL HISTORY: Past Medical History:  Diagnosis Date   Allergy    Arthritis    back   Diabetes mellitus without complication (HCC)    type 2    GERD (gastroesophageal reflux disease)    Hx of adenomatous colonic polyp 05/26/2008   Hyperlipidemia    Hypertension    Neuromuscular disorder (HCC)     PAST SURGICAL HISTORY: Past Surgical History:  Procedure Laterality Date   ABDOMINAL HYSTERECTOMY     APPENDECTOMY     BREAST BIOPSY Right 11/08/2024   MM RT BREAST BX W LOC DEV 1ST LESION IMAGE BX SPEC STEREO GUIDE 11/08/2024 [facility]   BREAST BIOPSY Right 11/08/2024   MM RT BREAST BX W LOC DEV EA AD LESION IMG BX SPEC STEREO GUIDE 11/08/2024 [facility]   BREAST BIOPSY Right 11/08/2024   MM RT BREAST BX W LOC DEV EA AD LESION IMG BX SPEC STEREO GUIDE 11/08/2024 [facility]   BREAST EXCISIONAL BIOPSY Left  BREAST SURGERY     left breast   CATARACT EXTRACTION Right 02/2020   COLONOSCOPY  10/11/2015   polyps 2009   JOINT REPLACEMENT     TONSILLECTOMY     removed age 20-5   TOTAL KNEE ARTHROPLASTY Left 04/17/2021   Procedure: LEFT TOTAL KNEE ARTHROPLASTY;  Surgeon: Sheril Coy, MD;  Location: [facility];  Service: Orthopedics;  Laterality: Left;   TRANSFORAMINAL LUMBAR INTERBODY FUSION (TLIF) WITH PEDICLE SCREW FIXATION 2 LEVEL Right 12/31/2018   Procedure: RIGHT - SIDED LUMBAR 2-3,LUMBAR 3-4, LUMBAR 4-5 TRANSFORAMINAL LUMBAR INTERBODY FUSION WITH INSTRUMENTATION AND ALLOGRAFT;  Surgeon: Beuford Anes, MD;  Location: [facility];  Service: Orthopedics;  Laterality: Right;    FAMILY HISTORY: Family History  Adopted: Yes  Problem Relation Age of Onset   Colon  cancer Maternal Aunt    Esophageal cancer Neg Hx    Rectal cancer Neg Hx    Stomach cancer Neg Hx    Breast cancer Neg Hx    Migraines Neg Hx    Seizures Neg Hx    Stroke Neg Hx    Sleep apnea Neg Hx     SOCIAL HISTORY: Social History   Socioeconomic History   Marital status: Married    Spouse name: Not on file   Number of children: Not on file   Years of education: Not on file   Highest education level: Not on file  Occupational History   Not on file  Tobacco Use   Smoking status: Former    Current packs/day: 0.00    Types: Cigarettes    Quit date: 08/18/1970    Years since quitting: 54.3   Smokeless tobacco: Never  Vaping Use   Vaping status: Never Used  Substance and Sexual Activity   Alcohol use: Never    Alcohol/week: 0.0 standard drinks of alcohol   Drug use: No   Sexual activity: Not on file  Other Topics Concern   Not on file  Social History Narrative   Patient is married   Former smoker never alcohol never drug use   Soda and some tea - with meals ( only has 2 meals a day)   Social Drivers of Health   Tobacco Use: Medium Risk (11/23/2024)   Patient History    Smoking Tobacco Use: Former    Smokeless Tobacco Use: Never    Passive Exposure: Not on Actuary Strain: Not on file  Food Insecurity: No Food Insecurity (04/09/2024)   Hunger Vital Sign    Worried About Running Out of Food in the Last Year: Never true    Ran Out of Food in the Last Year: Never true  Transportation Needs: No Transportation Needs (04/09/2024)   PRAPARE - Administrator, Civil Service (Medical): No    Lack of Transportation (Non-Medical): No  Physical Activity: Not on file  Stress: Not on file  Social Connections: Socially Integrated (04/09/2024)   Social Connection and Isolation Panel    Frequency of Communication with Friends and Family: More than three times a week    Frequency of Social Gatherings with Friends and Family: More than three times a week     Attends Religious Services: More than 4 times per year    Active Member of [employer] or Organizations: No    Attends Banker Meetings: 1 to 4 times per year    Marital Status: Married  Catering Manager Violence: Not At Risk (04/09/2024)   Humiliation, Afraid, Rape, and Kick questionnaire  Fear of Current or Ex-Partner: No    Emotionally Abused: No    Physically Abused: No    Sexually Abused: No  Depression (PHQ2-9): Not on file  Alcohol Screen: Not on file  Housing: Low Risk (04/09/2024)   Housing Stability Vital Sign    Unable to Pay for Housing in the Last Year: No    Number of Times Moved in the Last Year: 0    Homeless in the Last Year: No  Utilities: Not At Risk (04/09/2024)   AHC Utilities    Threatened with loss of utilities: No  Health Literacy: Not on file     PHYSICAL EXAM  GENERAL EXAM/CONSTITUTIONAL: Vitals:  Vitals:   11/23/24 1055  BP: (!) 140/88  Pulse: 88  SpO2: 97%  Weight: 177 lb 9.6 oz (80.6 kg)  Height: 5' 6 (1.676 m)   Body mass index is 28.67 kg/m. Wt Readings from Last 3 Encounters:  11/23/24 177 lb 9.6 oz (80.6 kg)  06/22/24 176 lb 2.4 oz (79.9 kg)  04/14/24 176 lb 2.4 oz (79.9 kg)   Patient is in no distress; well developed, nourished and groomed; neck is supple  CARDIOVASCULAR: Examination of carotid arteries is normal; no carotid bruits Regular rate and rhythm, no murmurs Examination of peripheral vascular system by observation and palpation is normal  EYES: Ophthalmoscopic exam of optic discs and posterior segments is normal; no papilledema or hemorrhages No results found.  MUSCULOSKELETAL: Gait, strength, tone, movements noted in Neurologic exam below  NEUROLOGIC: MENTAL STATUS:      No data to display         awake, alert, oriented to person, place and time recent and remote memory intact normal attention and concentration language fluent, comprehension intact, naming intact fund of knowledge  appropriate  CRANIAL NERVE:  2nd - no papilledema on fundoscopic exam 2nd, 3rd, 4th, 6th - pupils equal and reactive to light, visual fields full to confrontation, extraocular muscles intact, no nystagmus 5th - facial sensation symmetric 7th - facial strength symmetric 8th - hearing intact 9th - palate elevates symmetrically, uvula midline 11th - shoulder shrug symmetric 12th - tongue protrusion midline  MOTOR:  normal bulk and tone, full strength in the BUE, BLE  SENSORY:  normal and symmetric to light touch, pinprick, temperature, vibration, proprioception  COORDINATION:  finger-nose-finger, fine finger movements normal  REFLEXES:  deep tendon reflexes TRACE and symmetric  GAIT/STATION:  narrow based gait; USING CANE     DIAGNOSTIC DATA (LABS, IMAGING, TESTING) - I reviewed patient records, labs, notes, testing and imaging myself where available.  Lab Results  Component Value Date   WBC 11.5 (H) 04/11/2024   HGB 12.1 04/11/2024   HCT 35.7 (L) 04/11/2024   MCV 79.3 (L) 04/11/2024   PLT 205 04/11/2024      Component Value Date/Time   NA 139 04/14/2024 1128   K 3.7 04/14/2024 1128   CL 103 04/14/2024 1128   CO2 27 04/14/2024 1128   GLUCOSE 179 (H) 04/14/2024 1128   BUN 11 04/14/2024 1128   CREATININE 1.09 (H) 04/14/2024 1128   CALCIUM  8.8 (L) 04/14/2024 1128   PROT 6.0 (L) 04/11/2024 0255   ALBUMIN 2.2 (L) 04/11/2024 0255   AST 101 (H) 04/11/2024 0255   ALT 55 (H) 04/11/2024 0255   ALKPHOS 66 04/11/2024 0255   BILITOT 0.9 04/11/2024 0255   GFRNONAA 51 (L) 04/14/2024 1128   GFRAA 44 (L) 12/29/2018 1013   No results found for: CHOL, HDL, LDLCALC, LDLDIRECT,  TRIG, CHOLHDL Lab Results  Component Value Date   HGBA1C 6.3 (H) 04/09/2024   No results found for: VITAMINB12 No results found for: TSH  B12 normal (per PCP notes)    ASSESSMENT AND PLAN  84 y.o. year old female here with:  Dx:  1. Diabetic polyneuropathy associated with  type 2 diabetes mellitus (HCC)    PLAN:  Mild diabetic neuropathy (diabetes since age 57 years old) - stable; continue diabetes control - continue gabapentin  300mg  twice a day for neuropathy pain - consider gait and balance exercises (via PT, [employer], home exercises)  Return for return to PCP, pending if symptoms worsen or fail to improve.    EDUARD FABIENE HANLON, MD 11/23/2024, 11:17 AM Certified in Neurology, Neurophysiology and Neuroimaging  [facility] [address] [place], [place] [address] [phone]     [1] No Known Allergies  "

## 2024-11-23 NOTE — Telephone Encounter (Signed)
 Late entry from 1/5 - Spoke with patient regarding her [facility] appts on 1/7.  Explained to patient that per her recent bx, there is no definitive dx for DCIS and better assessed on excision and that there was not a need to be seen in [facility] clinic.  Dr. Ebbie has agreed to just see her to discuss excision and that they may be all she needs.  Informed her we would cancel appts for [facility] but to still come to the cancer center at 1245 to see Dr. Ebbie only. Patient verbalized understanding. Contact information given in case she has further questions before 1/7.

## 2024-11-24 ENCOUNTER — Inpatient Hospital Stay: Admitting: Hematology and Oncology

## 2024-11-24 ENCOUNTER — Ambulatory Visit: Admitting: Radiation Oncology

## 2024-11-24 ENCOUNTER — Other Ambulatory Visit: Payer: Self-pay | Admitting: General Surgery

## 2024-11-24 ENCOUNTER — Inpatient Hospital Stay

## 2024-11-24 ENCOUNTER — Ambulatory Visit: Payer: Self-pay | Admitting: Physical Therapy

## 2024-11-24 DIAGNOSIS — N6091 Unspecified benign mammary dysplasia of right breast: Secondary | ICD-10-CM

## 2024-11-24 NOTE — Progress Notes (Signed)
 "   REFERRING PHYSICIAN:  Gudena, Vinay K, MD PROVIDER:  DONNICE CARLIN BURY, MD MRN: [id number] DOB: [birth year] DATE OF ENCOUNTER: 11/24/2024 Subjective    Chief Complaint: Breast Cancer   History of Present Illness: 27 yof with screening mm that showed right breast calcifications. She has b density breast tissue.  Dx mm shows 3 mm LOQ right breast calcs and 2 mm UIQ calcs in the right breast also. The first one in LOQ is benign.  The second in the UIQ is ADH involving a papilloma bordering on DCIS. She had three biopsies and other is benign concordant.  She has no mass or dc.       Review of Systems: A complete review of systems was obtained from the patient.  I have reviewed this information and discussed as appropriate with the patient.  See HPI as well for other ROS.  Review of Systems  All other systems reviewed and are negative.    Medical History: Past Medical History:  Diagnosis Date   Arthritis    Chronic kidney disease    Diabetes mellitus without complication (CMS/HHS-HCC)    GERD (gastroesophageal reflux disease)    Hyperlipidemia    Hypertension     Patient Active Problem List  Diagnosis   Chronic kidney disease, stage 3b (CMS-HCC)   Gastroesophageal reflux disease   Hx of adenomatous colonic polyps   Hypertension   Milia   Prolonged QT interval   Pure hypercholesterolemia   Radiculopathy   Right lower lobe pneumonia   Severe sepsis (CMS/HHS-HCC)   Type 2 diabetes mellitus with hyperlipidemia (CMS/HHS-HCC)   Unilateral primary osteoarthritis, left knee   Vasomotor rhinitis    Past Surgical History:  Procedure Laterality Date   COLONOSCOPY N/A 10/11/2015   Transforaminal lumbar interbody fusion (tlif) with pedicle screw fixation 2 level (Right) Right 12/31/2018   Cataract extraction (Right) Right 02/2020   Total knee arthroplasty (Left) Left 04/17/2021   Breast biopsy (Right) Right 11/08/2024   .Joint replacement N/A     Unknown date   ABDOMINAL HYSTERECTOMY N/A    Unknown date   APPENDECTOMY N/A    Unknown date   Breast excisional biopsy (Left) Left    Unknown date   Breast surgery N/A    Unknown date   TONSILLECTOMY N/A    Unknown date     No Known Allergies  Current Outpatient Medications on File Prior to Visit  Medication Sig Dispense Refill   acetaminophen  (TYLENOL ) 650 MG ER tablet Take 1,300 mg by mouth once daily as needed for Pain     amLODIPine  (NORVASC ) 10 MG tablet Take 10 mg by mouth once daily     atorvastatin  (LIPITOR) 40 MG tablet Take 40 mg by mouth at bedtime     b complex vitamins capsule Take 1 capsule by mouth once daily     ezetimibe  (ZETIA ) 10 mg tablet Take 10 mg by mouth once daily     FARXIGA 10 mg tablet Take 10 mg by mouth once daily     gabapentin  (NEURONTIN ) 300 MG capsule Take 300 mg by mouth once daily     lisinopriL  (ZESTRIL ) 20 MG tablet Take 20 mg by mouth once daily     metFORMIN  (GLUCOPHAGE ) 500 MG tablet Take 500 mg by mouth once daily     metoprolol  SUCCinate (TOPROL -XL) 50 MG XL tablet Take 50 mg by mouth once daily     traZODone (DESYREL) 50 MG tablet Take 50 mg by mouth at bedtime as  needed for Sleep     No current facility-administered medications on file prior to visit.    Family History  Adopted: Yes  Problem Relation Age of Onset   Colon cancer Maternal Aunt      Social History   Tobacco Use  Smoking Status Former   Types: Cigarettes  Smokeless Tobacco Never     Social History   Socioeconomic History   Marital status: Married  Tobacco Use   Smoking status: Former    Types: Cigarettes   Smokeless tobacco: Never  Vaping Use   Vaping status: Unknown  Substance and Sexual Activity   Alcohol use: Never   Drug use: Never   Social Drivers of Architectural Technologist Insecurity: No Food Insecurity (04/09/2024)   Received [facility]   Hunger Vital Sign    Within the past 12 months, you worried that your food  would run out before you got the money to buy more.: Never true    Within the past 12 months, the food you bought just didn't last and you didn't have money to get more.: Never true  Transportation Needs: No Transportation Needs (04/09/2024)   Received from [facility]    Lack of Transportation (Medical): No    Lack of Transportation (Non-Medical): No  Social Connections: Socially Integrated (04/09/2024)   Received from [facility]   Social Connection and Isolation Panel    In a typical week, how many times do you talk on the phone with family, friends, or neighbors?: More than three times a week    How often do you get together with friends or relatives?: More than three times a week    How often do you attend church or religious services?: More than 4 times per year    Do you belong to any clubs or organizations such as church groups, unions, fraternal or athletic groups, or school groups?: No    How often do you attend meetings of the clubs or organizations you belong to?: 1 to 4 times per year    Are you married, widowed, divorced, separated, never married, or living with a partner?: Married  Housing Stability: Unknown (11/24/2024)   Housing Stability Vital Sign    Homeless in the Last Year: No    Objective:  There were no vitals filed for this visit.  There is no height or weight on file to calculate BMI.  Physical Exam Vitals reviewed.  Constitutional:      Appearance: Normal appearance.  Chest:  Breasts:    Right: No inverted nipple, mass or nipple discharge.     Left: No inverted nipple, mass or nipple discharge.  Lymphadenopathy:     Upper Body:     Right upper body: No supraclavicular or axillary adenopathy.     Left upper body: No supraclavicular or axillary adenopathy.  Neurological:     Mental Status: She is alert.        Assessment and Plan:     Right breast seed guided excisional biopsy times two  Discussed pathology.   She has adh and another lesion bordering on adh.  We discussed two excisional biopsies to figure out what these are.  Understands may have dcis and this would mean conversations about radiotherapy and possibly antiestrogen therapy. Discussed surgery, risks and recovery, will proceed    MATTHEW CARLIN BURY, MD     "

## 2024-11-26 ENCOUNTER — Other Ambulatory Visit: Payer: Self-pay | Admitting: General Surgery

## 2024-11-26 DIAGNOSIS — N6091 Unspecified benign mammary dysplasia of right breast: Secondary | ICD-10-CM

## 2024-12-03 ENCOUNTER — Encounter (HOSPITAL_COMMUNITY): Payer: Self-pay

## 2024-12-03 NOTE — Pre-Procedure Instructions (Signed)
 Surgical Instructions   Your procedure is scheduled on December 09, 2024. Report to [facility] Main Entrance A at 6:50 A.M., then check in with the Admitting office. Any questions or running late day of surgery: call [phone]  Questions prior to your surgery date: call [phone], Monday-Friday, 8am-4pm. If you experience any cold or flu symptoms such as cough, fever, chills, shortness of breath, etc. between now and your scheduled surgery, please notify us  at the above number.     Remember:  Do not eat after midnight the night before your surgery   You may drink clear liquids until 5:50 AM the morning of your surgery.   Clear liquids allowed are: Water, Non-Citrus Juices (without pulp), Carbonated Beverages, Clear Tea (no milk, honey, etc.), Black Coffee Only (NO MILK, CREAM OR POWDERED CREAMER of any kind), and Gatorade.  Patient Instructions  The night before surgery:  No food after midnight. ONLY clear liquids after midnight  The day of surgery (if you have diabetes): Drink ONE (1) 12 oz G2 given to you in your pre admission testing appointment by 5:50 AM the morning of surgery. Drink in one sitting. Do not sip.  This drink was given to you during your hospital  pre-op appointment visit.  Nothing else to drink after completing the  12 oz bottle of G2.         If you have questions, please contact your surgeons office.    Take these medicines the morning of surgery with A SIP OF WATER: amLODipine  (NORVASC )  atorvastatin  (LIPITOR)  ezetimibe  (ZETIA )  gabapentin  (NEURONTIN )  metoprolol  succinate (TOPROL -XL)    May take these medicines IF NEEDED: acetaminophen  (TYLENOL )    One week prior to surgery, STOP taking any Aspirin  (unless otherwise instructed by your surgeon) Aleve, Naproxen, Ibuprofen, Motrin, Advil, Goody's, BC's, all herbal medications, fish oil, and non-prescription vitamins.   WHAT DO I DO ABOUT MY DIABETES MEDICATION?   Do not take metFORMIN   (GLUCOPHAGE ) the morning of surgery.  STOP taking your FARXIGA three days prior to surgery. Your last dose will be January 18th.       HOW TO MANAGE YOUR DIABETES BEFORE AND AFTER SURGERY  Why is it important to control my blood sugar before and after surgery? Improving blood sugar levels before and after surgery helps healing and can limit problems. A way of improving blood sugar control is eating a healthy diet by:  Eating less sugar and carbohydrates  Increasing activity/exercise  Talking with your doctor about reaching your blood sugar goals High blood sugars (greater than 180 mg/dL) can raise your risk of infections and slow your recovery, so you will need to focus on controlling your diabetes during the weeks before surgery. Make sure that the doctor who takes care of your diabetes knows about your planned surgery including the date and location.  How do I manage my blood sugar before surgery? Check your blood sugar at least 4 times a day, starting 2 days before surgery, to make sure that the level is not too high or low.  Check your blood sugar the morning of your surgery when you wake up and every 2 hours until you get to the Short Stay unit.  If your blood sugar is less than 70 mg/dL, you will need to treat for low blood sugar: Do not take insulin . Treat a low blood sugar (less than 70 mg/dL) with  cup of clear juice (cranberry or apple), 4 glucose tablets, OR glucose gel. Recheck blood sugar  in 15 minutes after treatment (to make sure it is greater than 70 mg/dL). If your blood sugar is not greater than 70 mg/dL on recheck, call [phone] for further instructions. Report your blood sugar to the short stay nurse when you get to Short Stay.  If you are admitted to the hospital after surgery: Your blood sugar will be checked by the staff and you will probably be given insulin  after surgery (instead of oral diabetes medicines) to make sure you have good blood sugar levels. The  goal for blood sugar control after surgery is 80-180 mg/dL.                      Do NOT Smoke (Tobacco/Vaping) for 24 hours prior to your procedure.  If you use a CPAP at night, you may bring your mask/headgear for your overnight stay.   You will be asked to remove any contacts, glasses, piercing's, hearing aid's, dentures/partials prior to surgery. Please bring cases for these items if needed.    Your surgeon will determine if you are to be admitted or discharged the same day.  Patients discharged the day of surgery will not be allowed to drive home, and someone needs to stay with them for 24 hours.  SURGICAL WAITING ROOM VISITATION Patients may have no more than 2 support people in the waiting area - these visitors may rotate.   Pre-op nurse will coordinate an appropriate time for 2 ADULT support persons, who may not rotate, to accompany patient in pre-op.  Children under the age of 84 must have an adult with them who is not the patient and must remain in the main waiting area with an adult.  If the patient needs to stay at the hospital during part of their recovery, the visitor guidelines for inpatient rooms apply.  Please refer to the [facility] website for the visitor guidelines for any additional information.   If you received a COVID test during your pre-op visit  it is requested that you wear a mask when out in public, stay away from anyone that may not be feeling well and notify your surgeon if you develop symptoms. If you have been in contact with anyone that has tested positive in the last 10 days please notify you surgeon.      Pre-operative CHG Bathing Instructions   You can play a key role in reducing the risk of infection after surgery. Your skin needs to be as free of germs as possible. You can reduce the number of germs on your skin by washing with CHG (chlorhexidine  gluconate) soap before surgery. CHG is an antiseptic soap that kills germs and continues to kill germs  even after washing.   DO NOT use if you have an allergy to chlorhexidine /CHG or antibacterial soaps. If your skin becomes reddened or irritated, stop using the CHG and notify one of our RNs at [phone].              TAKE A SHOWER THE NIGHT BEFORE SURGERY   Please keep in mind the following:  DO NOT shave, including legs and underarms, 48 hours prior to surgery.   You may shave your face before/day of surgery.  Place clean sheets on your bed the night before surgery Use a clean washcloth (not used since being washed) for shower. DO NOT sleep with pet's night before surgery.  CHG Shower Instructions:  Wash your face and private area with normal soap. If you choose to wash  your hair, wash first with your normal shampoo.  After you use shampoo/soap, rinse your hair and body thoroughly to remove shampoo/soap residue.  Turn the water OFF and apply half the bottle of CHG soap to a CLEAN washcloth.  Apply CHG soap ONLY FROM YOUR NECK DOWN TO YOUR TOES (washing for 3-5 minutes)  DO NOT use CHG soap on face, private areas, open wounds, or sores.  Pay special attention to the area where your surgery is being performed.  If you are having back surgery, having someone wash your back for you may be helpful. Wait 2 minutes after CHG soap is applied, then you may rinse off the CHG soap.  Pat dry with a clean towel  Put on clean pajamas    Additional instructions for the day of surgery: If you choose, you may shower the morning of surgery with an antibacterial soap.  DO NOT APPLY any lotions, deodorants, cologne, or perfumes.   Do not wear jewelry or makeup Do not wear nail polish, gel polish, artificial nails, or any other type of covering on natural nails (fingers and toes) Do not bring valuables to the hospital. [facility] is not responsible for valuables/personal belongings. Put on clean/comfortable clothes.  Please brush your teeth.  Ask your nurse before applying any prescription  medications to the skin.

## 2024-12-06 ENCOUNTER — Encounter (HOSPITAL_COMMUNITY)
Admission: RE | Admit: 2024-12-06 | Discharge: 2024-12-06 | Disposition: A | Source: Ambulatory Visit | Attending: General Surgery

## 2024-12-06 ENCOUNTER — Encounter (HOSPITAL_COMMUNITY): Payer: Self-pay

## 2024-12-06 ENCOUNTER — Other Ambulatory Visit: Payer: Self-pay

## 2024-12-06 VITALS — BP 155/72 | HR 72 | Temp 98.6°F | Resp 18 | Ht 66.0 in | Wt 175.5 lb

## 2024-12-06 DIAGNOSIS — Z01818 Encounter for other preprocedural examination: Secondary | ICD-10-CM

## 2024-12-06 DIAGNOSIS — Z01812 Encounter for preprocedural laboratory examination: Secondary | ICD-10-CM | POA: Insufficient documentation

## 2024-12-06 HISTORY — DX: Chronic kidney disease, unspecified: N18.9

## 2024-12-06 HISTORY — DX: Failed or difficult intubation, initial encounter: T88.4XXA

## 2024-12-06 HISTORY — DX: Pneumonia, unspecified organism: J18.9

## 2024-12-06 HISTORY — DX: Cardiac murmur, unspecified: R01.1

## 2024-12-06 LAB — BASIC METABOLIC PANEL WITH GFR
Anion gap: 14 (ref 5–15)
BUN: 18 mg/dL (ref 8–23)
CO2: 23 mmol/L (ref 22–32)
Calcium: 9.1 mg/dL (ref 8.9–10.3)
Chloride: 103 mmol/L (ref 98–111)
Creatinine, Ser: 1.38 mg/dL — ABNORMAL HIGH (ref 0.44–1.00)
GFR, Estimated: 38 mL/min — ABNORMAL LOW
Glucose, Bld: 85 mg/dL (ref 70–99)
Potassium: 3.5 mmol/L (ref 3.5–5.1)
Sodium: 140 mmol/L (ref 135–145)

## 2024-12-06 LAB — GLUCOSE, CAPILLARY: Glucose-Capillary: 75 mg/dL (ref 70–99)

## 2024-12-06 LAB — CBC
HCT: 38.6 % (ref 36.0–46.0)
Hemoglobin: 12.7 g/dL (ref 12.0–15.0)
MCH: 27 pg (ref 26.0–34.0)
MCHC: 32.9 g/dL (ref 30.0–36.0)
MCV: 82 fL (ref 80.0–100.0)
Platelets: 240 K/uL (ref 150–400)
RBC: 4.71 MIL/uL (ref 3.87–5.11)
RDW: 14.7 % (ref 11.5–15.5)
WBC: 7.2 K/uL (ref 4.0–10.5)
nRBC: 0 % (ref 0.0–0.2)

## 2024-12-06 NOTE — Progress Notes (Signed)
 PCP -  Cardiologist - Ladona Heinz, MD   PPM/ICD - denies Device Orders - n/a Rep Notified - n/a  Chest x-ray - 04-09-2024 EKG - 04-10-2024 Stress Test - 09-30-2022 ECHO - 08-25-2024 Cardiac Cath -   Sleep Study - denies CPAP - n/a  Fasting Blood Sugar - Per patient blood sugar ranges 110-120 Checks Blood Sugar per patient 1 -2 times a week. Last A1c was 6.5 on 10-27-2024 (CE)  Last dose of GLP1 agonist-  denies GLP1 instructions: denies  Blood Thinner Instructions:denies Aspirin  Instructions:denies  ERAS Protcol -clear liquids until 5:50 am. PRE-SURGERY  G2-   COVID TEST- n/a   Anesthesia review: yes breast seed  Patient denies shortness of breath, fever, cough and chest pain at PAT appointment   All instructions explained to the patient, with a verbal understanding of the material. Patient agrees to go over the instructions while at home for a better understanding. Patient also instructed to self quarantine after being tested for COVID-19. The opportunity to ask questions was provided.

## 2024-12-07 ENCOUNTER — Inpatient Hospital Stay
Admission: RE | Admit: 2024-12-07 | Discharge: 2024-12-07 | Disposition: A | Source: Ambulatory Visit | Attending: General Surgery | Admitting: General Surgery

## 2024-12-07 ENCOUNTER — Ambulatory Visit
Admission: RE | Admit: 2024-12-07 | Discharge: 2024-12-07 | Disposition: A | Source: Ambulatory Visit | Attending: General Surgery | Admitting: General Surgery

## 2024-12-07 DIAGNOSIS — N6091 Unspecified benign mammary dysplasia of right breast: Secondary | ICD-10-CM

## 2024-12-07 HISTORY — PX: BREAST BIOPSY: SHX20

## 2024-12-07 NOTE — Anesthesia Preprocedure Evaluation (Signed)
 "                                  Anesthesia Evaluation  Patient identified by MRN, date of birth, ID band Patient awake    Reviewed: Allergy & Precautions, NPO status , Patient's Chart, lab work & pertinent test results  History of Anesthesia Complications (+) DIFFICULT AIRWAY and history of anesthetic complications  Airway Mallampati: II  TM Distance: >3 FB Neck ROM: Full    Dental no notable dental hx.    Pulmonary neg pulmonary ROS, former smoker   Pulmonary exam normal        Cardiovascular hypertension, Pt. on medications and Pt. on home beta blockers  Rhythm:Regular Rate:Normal  ECHO 2025 IMPRESSIONS   1. Left ventricular ejection fraction, by estimation, is 60 to 65%. The left ventricle has normal function. The left ventricle has no regional wall motion abnormalities. There is mild concentric left ventricular hypertrophy. Left ventricular diastolic parameters are consistent with Grade I diastolic dysfunction (impaired relaxation).  2. Right ventricular systolic function is normal. The right ventricular size is normal. There is normal pulmonary artery systolic pressure. The estimated right ventricular systolic pressure is 31.5 mmHg.  3. The mitral valve is abnormal. Mild mitral valve regurgitation. No evidence of mitral stenosis.  4. Tricuspid valve regurgitation is mild to moderate.  5. The aortic valve is tricuspid. There is mild calcification of the aortic valve. Aortic valve regurgitation is not visualized. Aortic valve sclerosis/calcification is present, without any evidence of aortic stenosis.  6. The inferior vena cava is normal in size with greater than 50% respiratory variability, suggesting right atrial pressure of 3 mmHg.    Neuro/Psych negative neurological ROS  negative psych ROS   GI/Hepatic Neg liver ROS,GERD  ,,  Endo/Other  diabetes, Type 2, Oral Hypoglycemic Agents    Renal/GU   negative genitourinary   Musculoskeletal  (+)  Arthritis ,  Atypical ductal hyperplasia    Abdominal Normal abdominal exam  (+)   Peds  Hematology Lab Results      Component                Value               Date                      WBC                      7.2                 12/06/2024                HGB                      12.7                12/06/2024                HCT                      38.6                12/06/2024                MCV  82.0                12/06/2024                PLT                      240                 12/06/2024             Lab Results      Component                Value               Date                      NA                       140                 12/06/2024                K                        3.5                 12/06/2024                CO2                      23                  12/06/2024                GLUCOSE                  85                  12/06/2024                BUN                      18                  12/06/2024                CREATININE               1.38 (H)            12/06/2024                CALCIUM                   9.1                 12/06/2024                EGFR                     43.0                01/23/2023                GFRNONAA                 38 (L)  12/06/2024              Anesthesia Other Findings   Reproductive/Obstetrics                              Anesthesia Physical Anesthesia Plan  ASA: 3  Anesthesia Plan: General   Post-op Pain Management: Tylenol  PO (pre-op)*   Induction: Intravenous  PONV Risk Score and Plan: 3 and Ondansetron , Dexamethasone  and Treatment may vary due to age or medical condition  Airway Management Planned: Mask and LMA  Additional Equipment: None  Intra-op Plan:   Post-operative Plan: Extubation in OR  Informed Consent: I have reviewed the patients History and Physical, chart, labs and discussed the procedure including the risks, benefits and  alternatives for the proposed anesthesia with the patient or authorized representative who has indicated his/her understanding and acceptance.     Dental advisory given  Plan Discussed with: CRNA  Anesthesia Plan Comments: (Difficult airway per intubation 12/31/2018:  Procedure Name: Intubation Date/Time: 12/31/2018 7:45 AM Performed by: Franchot, April W, CRNA Pre-anesthesia Checklist: Patient identified, Emergency Drugs available, Suction available and Patient being monitored Patient Re-evaluated:Patient Re-evaluated prior to induction Oxygen Delivery Method: Circle system utilized Preoxygenation: Pre-oxygenation with 100% oxygen Induction Type: IV induction Ventilation: Mask ventilation without difficulty Laryngoscope Size: Miller, 3 and Glidescope Grade View: Grade II Tube type: Oral Number of attempts: 1 Airway Equipment and Method: Stylet and Oral airway Placement Confirmation: ETT inserted through vocal cords under direct vision,  positive ETCO2 and breath sounds checked- equal and bilateral Secured at: 22 (lip) cm Tube secured with: Tape Dental Injury: Teeth and Oropharynx as per pre-operative assessment  Difficulty Due To: Difficult Airway- due to anterior larynx and Difficult Airway- due to dentition Future Recommendations: Recommend- induction with short-acting agent, and alternative techniques readily available Comments: DL  X 1 Miller 3, protruding teeth limited visualization. Grade one view with glide scope, easy intubation with glide.   )         Anesthesia Quick Evaluation  "

## 2024-12-08 NOTE — H&P (Signed)
 " 28 yof with screening mm that showed right breast calcifications. She has b density breast tissue. Dx mm shows 3 mm LOQ right breast calcs and 2 mm UIQ calcs in the right breast also. The first one in LOQ is benign. The second in the UIQ is ADH involving a papilloma bordering on DCIS. She had three biopsies and other is benign concordant. She has no mass or dc.   Review of Systems: A complete review of systems was obtained from the patient. I have reviewed this information and discussed as appropriate with the patient. See HPI as well for other ROS.  Review of Systems  All other systems reviewed and are negative.   Medical History: Past Medical History:  Diagnosis Date  Arthritis  Chronic kidney disease  Diabetes mellitus without complication (CMS/HHS-HCC)  GERD (gastroesophageal reflux disease)  Hyperlipidemia  Hypertension   Patient Active Problem List  Diagnosis  Chronic kidney disease, stage 3b (CMS-HCC)  Gastroesophageal reflux disease  Hx of adenomatous colonic polyps  Hypertension  Milia  Prolonged QT interval  Pure hypercholesterolemia  Radiculopathy  Right lower lobe pneumonia  Severe sepsis (CMS/HHS-HCC)  Type 2 diabetes mellitus with hyperlipidemia (CMS/HHS-HCC)  Unilateral primary osteoarthritis, left knee  Vasomotor rhinitis   Past Surgical History:  Procedure Laterality Date  COLONOSCOPY N/A 10/11/2015  Transforaminal lumbar interbody fusion (tlif) with pedicle screw fixation 2 level (Right) Right 12/31/2018  Cataract extraction (Right) Right 02/2020  Total knee arthroplasty (Left) Left 04/17/2021  Breast biopsy (Right) Right 11/08/2024  .Joint replacement N/A  Unknown date  ABDOMINAL HYSTERECTOMY N/A  Unknown date  APPENDECTOMY N/A  Unknown date  Breast excisional biopsy (Left) Left  Unknown date  Breast surgery N/A  Unknown date  TONSILLECTOMY N/A  Unknown date   No Known Allergies  Current Outpatient Medications on File Prior to Visit   Medication Sig Dispense Refill  acetaminophen  (TYLENOL ) 650 MG ER tablet Take 1,300 mg by mouth once daily as needed for Pain  amLODIPine  (NORVASC ) 10 MG tablet Take 10 mg by mouth once daily  atorvastatin  (LIPITOR) 40 MG tablet Take 40 mg by mouth at bedtime  b complex vitamins capsule Take 1 capsule by mouth once daily  ezetimibe  (ZETIA ) 10 mg tablet Take 10 mg by mouth once daily  FARXIGA 10 mg tablet Take 10 mg by mouth once daily  gabapentin  (NEURONTIN ) 300 MG capsule Take 300 mg by mouth once daily  lisinopriL  (ZESTRIL ) 20 MG tablet Take 20 mg by mouth once daily  metFORMIN  (GLUCOPHAGE ) 500 MG tablet Take 500 mg by mouth once daily  metoprolol  SUCCinate (TOPROL -XL) 50 MG XL tablet Take 50 mg by mouth once daily  traZODone (DESYREL) 50 MG tablet Take 50 mg by mouth at bedtime as needed for Sleep   Family History  Adopted: Yes  Problem Relation Age of Onset  Colon cancer Maternal Aunt    Social History   Tobacco Use  Smoking Status Former  Types: Cigarettes  Smokeless Tobacco Never  Marital status: Married  Tobacco Use  Smoking status: Former  Types: Cigarettes  Smokeless tobacco: Never  Vaping Use  Vaping status: Unknown  Substance and Sexual Activity  Alcohol use: Never  Drug use: Never    Objective:   Physical Exam Vitals reviewed.  Constitutional:  Appearance: Normal appearance.  Chest:  Breasts: Right: No inverted nipple, mass or nipple discharge.  Left: No inverted nipple, mass or nipple discharge.  Lymphadenopathy:  Upper Body:  Right upper body: No supraclavicular or axillary adenopathy.  Left upper body: No supraclavicular or axillary adenopathy.  Neurological:  Mental Status: She is alert.    Assessment and Plan:   Right breast seed guided excisional biopsy times two  Discussed pathology. She has adh and another lesion bordering on adh. We discussed two excisional biopsies to figure out what these are. Understands may have dcis and this would  mean conversations about radiotherapy and possibly antiestrogen therapy. Discussed surgery, risks and recovery, will proceed  "

## 2024-12-09 ENCOUNTER — Ambulatory Visit (HOSPITAL_COMMUNITY)

## 2024-12-09 ENCOUNTER — Ambulatory Visit
Admission: RE | Admit: 2024-12-09 | Discharge: 2024-12-09 | Disposition: A | Discharge status: Home / Self Care | Source: Ambulatory Visit | Attending: General Surgery | Admitting: General Surgery

## 2024-12-09 ENCOUNTER — Encounter (HOSPITAL_COMMUNITY)
Admission: RE | Disposition: A | Discharge status: Home / Self Care | Payer: Self-pay | Source: Home / Self Care | Attending: General Surgery

## 2024-12-09 ENCOUNTER — Other Ambulatory Visit: Payer: Self-pay

## 2024-12-09 ENCOUNTER — Encounter (HOSPITAL_COMMUNITY): Admitting: Physician Assistant

## 2024-12-09 ENCOUNTER — Ambulatory Visit (HOSPITAL_COMMUNITY)
Admission: RE | Admit: 2024-12-09 | Discharge: 2024-12-09 | Disposition: A | Discharge status: Home / Self Care | Attending: General Surgery | Admitting: General Surgery

## 2024-12-09 ENCOUNTER — Encounter (HOSPITAL_COMMUNITY): Payer: Self-pay | Admitting: General Surgery

## 2024-12-09 DIAGNOSIS — I129 Hypertensive chronic kidney disease with stage 1 through stage 4 chronic kidney disease, or unspecified chronic kidney disease: Secondary | ICD-10-CM | POA: Diagnosis not present

## 2024-12-09 DIAGNOSIS — D241 Benign neoplasm of right breast: Secondary | ICD-10-CM | POA: Diagnosis not present

## 2024-12-09 DIAGNOSIS — Z87891 Personal history of nicotine dependence: Secondary | ICD-10-CM

## 2024-12-09 DIAGNOSIS — N6021 Fibroadenosis of right breast: Secondary | ICD-10-CM | POA: Diagnosis not present

## 2024-12-09 DIAGNOSIS — I10 Essential (primary) hypertension: Secondary | ICD-10-CM

## 2024-12-09 DIAGNOSIS — N6081 Other benign mammary dysplasias of right breast: Secondary | ICD-10-CM | POA: Diagnosis not present

## 2024-12-09 DIAGNOSIS — E1122 Type 2 diabetes mellitus with diabetic chronic kidney disease: Secondary | ICD-10-CM | POA: Insufficient documentation

## 2024-12-09 DIAGNOSIS — N1832 Chronic kidney disease, stage 3b: Secondary | ICD-10-CM | POA: Diagnosis not present

## 2024-12-09 DIAGNOSIS — N6011 Diffuse cystic mastopathy of right breast: Secondary | ICD-10-CM | POA: Insufficient documentation

## 2024-12-09 DIAGNOSIS — Z7984 Long term (current) use of oral hypoglycemic drugs: Secondary | ICD-10-CM | POA: Diagnosis not present

## 2024-12-09 DIAGNOSIS — N6091 Unspecified benign mammary dysplasia of right breast: Secondary | ICD-10-CM | POA: Diagnosis present

## 2024-12-09 LAB — GLUCOSE, CAPILLARY
Glucose-Capillary: 130 mg/dL — ABNORMAL HIGH (ref 70–99)
Glucose-Capillary: 135 mg/dL — ABNORMAL HIGH (ref 70–99)

## 2024-12-09 MED ORDER — FENTANYL CITRATE (PF) 100 MCG/2ML IJ SOLN
INTRAMUSCULAR | Status: AC
  Filled 2024-12-09: qty 2

## 2024-12-09 MED ORDER — PHENYLEPHRINE 80 MCG/ML (10ML) SYRINGE FOR IV PUSH (FOR BLOOD PRESSURE SUPPORT)
PREFILLED_SYRINGE | INTRAVENOUS | Status: DC | PRN
  Administered 2024-12-09 (×3): 80 ug via INTRAVENOUS

## 2024-12-09 MED ORDER — CHLORHEXIDINE GLUCONATE CLOTH 2 % EX PADS: 6.0000 | MEDICATED_PAD | Freq: Once | CUTANEOUS | Status: DC

## 2024-12-09 MED ORDER — ORAL CARE MOUTH RINSE: 15.0000 mL | Freq: Once | OROMUCOSAL | Status: AC

## 2024-12-09 MED ORDER — LACTATED RINGERS IV SOLN: INTRAVENOUS | Status: DC

## 2024-12-09 MED ORDER — CEFAZOLIN SODIUM-DEXTROSE 2-4 GM/100ML-% IV SOLN
INTRAVENOUS | Status: AC
  Filled 2024-12-09: qty 100

## 2024-12-09 MED ORDER — ONDANSETRON HCL 4 MG/2ML IJ SOLN
INTRAMUSCULAR | Status: DC | PRN
  Administered 2024-12-09: 4 mg via INTRAVENOUS

## 2024-12-09 MED ORDER — BUPIVACAINE-EPINEPHRINE (PF) 0.25% -1:200000 IJ SOLN
INTRAMUSCULAR | Status: AC
  Filled 2024-12-09: qty 30

## 2024-12-09 MED ORDER — BUPIVACAINE-EPINEPHRINE 0.25% -1:200000 IJ SOLN
INTRAMUSCULAR | Status: DC | PRN
  Administered 2024-12-09: 10 mL

## 2024-12-09 MED ORDER — ACETAMINOPHEN 500 MG PO TABS
ORAL_TABLET | ORAL | Status: AC
  Administered 2024-12-09: 1000 mg via ORAL
  Filled 2024-12-09: qty 2

## 2024-12-09 MED ORDER — CEFAZOLIN SODIUM-DEXTROSE 2-4 GM/100ML-% IV SOLN
2.0000 g | INTRAVENOUS | Status: AC
  Administered 2024-12-09: 2 g via INTRAVENOUS

## 2024-12-09 MED ORDER — CHLORHEXIDINE GLUCONATE 0.12 % MT SOLN: 15.0000 mL | Freq: Once | OROMUCOSAL | Status: AC

## 2024-12-09 MED ORDER — FENTANYL CITRATE (PF) 100 MCG/2ML IJ SOLN
25.0000 ug | INTRAMUSCULAR | Status: DC | PRN
  Administered 2024-12-09: 25 ug via INTRAVENOUS

## 2024-12-09 MED ORDER — PROPOFOL 10 MG/ML IV BOLUS
INTRAVENOUS | Status: DC | PRN
  Administered 2024-12-09: 120 mg via INTRAVENOUS

## 2024-12-09 MED ORDER — PROPOFOL 10 MG/ML IV BOLUS
INTRAVENOUS | Status: AC
  Filled 2024-12-09: qty 20

## 2024-12-09 MED ORDER — LIDOCAINE 2% (20 MG/ML) 5 ML SYRINGE
INTRAMUSCULAR | Status: DC | PRN
  Administered 2024-12-09: 60 mg via INTRAVENOUS

## 2024-12-09 MED ORDER — PHENYLEPHRINE HCL-NACL 20-0.9 MG/250ML-% IV SOLN
INTRAVENOUS | Status: DC | PRN
  Administered 2024-12-09: 50 ug/min via INTRAVENOUS

## 2024-12-09 MED ORDER — FENTANYL CITRATE (PF) 100 MCG/2ML IJ SOLN
INTRAMUSCULAR | Status: DC | PRN
  Administered 2024-12-09: 50 ug via INTRAVENOUS
  Administered 2024-12-09: 25 ug via INTRAVENOUS

## 2024-12-09 MED ORDER — CHLORHEXIDINE GLUCONATE 0.12 % MT SOLN
OROMUCOSAL | Status: AC
  Administered 2024-12-09: 15 mL via OROMUCOSAL
  Filled 2024-12-09: qty 15

## 2024-12-09 MED ORDER — ACETAMINOPHEN 500 MG PO TABS: 1000.0000 mg | ORAL_TABLET | ORAL | Status: AC

## 2024-12-09 MED ORDER — ENSURE PRE-SURGERY PO LIQD: 296.0000 mL | Freq: Once | ORAL | Status: DC

## 2024-12-09 MED ORDER — PROPOFOL 500 MG/50ML IV EMUL
INTRAVENOUS | Status: DC | PRN
  Administered 2024-12-09: 125 ug/kg/min via INTRAVENOUS

## 2024-12-09 NOTE — Discharge Instructions (Signed)
 [facility] Office Phone Number [phone]  POST OP INSTRUCTIONS Take 400 mg of ibuprofen every 8 hours or 650 mg tylenol  every 6 hours for next 72 hours then as needed. Use ice several times daily also.  A prescription for pain medication may be given to you upon discharge.  Take your pain medication as prescribed, if needed.  If narcotic pain medicine is not needed, then you may take acetaminophen  (Tylenol ), naprosyn (Alleve) or ibuprofen (Advil) as needed. Take your usually prescribed medications unless otherwise directed If you need a refill on your pain medication, please contact your pharmacy.  They will contact our office to request authorization.  Prescriptions will not be filled after 5pm or on week-ends. You should eat very light the first 24 hours after surgery, such as soup, crackers, pudding, etc.  Resume your normal diet the day after surgery. Most patients will experience some swelling and bruising in the breast.  Ice packs and a good support bra will help.  Wear the breast binder provided or a sports bra for 72 hours day and night.  After that wear a sports bra during the day until you return to the office. Swelling and bruising can take several days to resolve.  It is common to experience some constipation if taking pain medication after surgery.  Increasing fluid intake and taking a stool softener will usually help or prevent this problem from occurring.  A mild laxative (Milk of Magnesia or Miralax) should be taken according to package directions if there are no bowel movements after 48 hours. I used skin glue on the incision, you may shower in 24 hours.  The glue will flake off over the next 2-3 weeks as will the steristrips.   Any sutures or staples will be removed at the office during your follow-up visit. ACTIVITIES:  You may resume regular daily activities (gradually increasing) beginning the next day.  Wearing a good support bra or sports bra minimizes pain and  swelling.  You may have sexual intercourse when it is comfortable. You may drive when you no longer are taking prescription pain medication, you can comfortably wear a seatbelt, and you can safely maneuver your car and apply brakes. RETURN TO WORK:  ______________________________________________________________________________________ Felicia Shelton should see your doctor in the office for a follow-up appointment approximately two to three weeks after your surgery.  Your doctor's nurse will typically make your follow-up appointment when she calls you with your pathology report.  Expect your pathology report 3-4 business days after your surgery.  You may call to check if you do not hear from us  after three days.  WHEN TO CALL DR Felicia Shelton: Fever over 101.0 Nausea and/or vomiting. Extreme swelling or bruising. Continued bleeding from incision. Increased pain, redness, or drainage from the incision.  The clinic staff is available to answer your questions during regular business hours.  Please don't hesitate to call and ask to speak to one of the nurses for clinical concerns.  If you have a medical emergency, go to the nearest emergency room or call 911.  A surgeon from [facility] is always on call at the hospital.  For further questions, please visit [email or web address]

## 2024-12-09 NOTE — Transfer of Care (Signed)
 Immediate Anesthesia Transfer of Care Note  Patient: Felicia Shelton  Procedure(s) Performed: RADIOACTIVE SEED GUIDED BREAST BIOPSY (Right: Breast)  Patient Location: PACU  Anesthesia Type:General  Level of Consciousness: sleeping  Airway & Oxygen Therapy: Patient connected to face mask oxygen  Post-op Assessment: Report given to RN and Post -op Vital signs reviewed and stable  Post vital signs: Reviewed and stable  Last Vitals:  Vitals Value Taken Time  BP 88/53 12/09/24 10:00  Temp 36.6 C 12/09/24 09:59  Pulse 58 12/09/24 10:03  Resp 13 12/09/24 10:03  SpO2 97 % 12/09/24 10:03  Vitals shown include unfiled device data.  Last Pain:  Vitals:   12/09/24 0959  TempSrc:   PainSc: Asleep      Patients Stated Pain Goal: 3 (12/09/24 0756)  Complications: No notable events documented.

## 2024-12-09 NOTE — Interval H&P Note (Signed)
 History and Physical Interval Note:  12/09/2024 8:20 AM  Felicia Shelton  has presented today for surgery, with the diagnosis of ATYPICAL DUCTAL HYPERPLASIA.  The various methods of treatment have been discussed with the patient and family. After consideration of risks, benefits and other options for treatment, the patient has consented to  Procedures with comments: RADIOACTIVE SEED GUIDED BREAST BIOPSY (Right) - RIGHT BREAST SEED-GUIDED EXCISIONAL BIOPSY x2 as a surgical intervention.  The patient's history has been reviewed, patient examined, no change in status, stable for surgery.  I have reviewed the patient's chart and labs.  Questions were answered to the patient's satisfaction.     [name]

## 2024-12-09 NOTE — Anesthesia Procedure Notes (Signed)
 Procedure Name: LMA Insertion Date/Time: 12/09/2024 9:03 AM  Performed by: Jerl Donald LABOR, CRNAPre-anesthesia Checklist: Patient identified, Emergency Drugs available, Suction available and Patient being monitored Patient Re-evaluated:Patient Re-evaluated prior to induction Oxygen Delivery Method: Circle System Utilized Preoxygenation: Pre-oxygenation with 100% oxygen Induction Type: IV induction Ventilation: Mask ventilation without difficulty LMA: LMA inserted LMA Size: 4.0 Number of attempts: 1 Placement Confirmation: ETT inserted through vocal cords under direct vision, positive ETCO2 and breath sounds checked- equal and bilateral Tube secured with: Tape Dental Injury: Teeth and Oropharynx as per pre-operative assessment

## 2024-12-09 NOTE — Op Note (Signed)
 Preoperative diagnosis: right breast adh and adh involving papilloma bordering on dcis Postoperative diagnosis: Same as above Procedure: Right breast excisional biopsy times two Surgeon: Dr. Adina Bury Anesthesia: General Estimated blood loss: Minimal Specimens:  Right breast UIQ lesion marked with paint containing seed and clip Right breast LOQ lesion marked with paint containing clip Seed separate Complications: None Sponge needle count was correct completion Disposition recovery stable addition   Indications: 70 yof with screening mm that showed right breast calcifications. She has b density breast tissue. Dx mm shows 3 mm LOQ right breast calcs and 2 mm UIQ calcs in the right breast also. The first one in LOQ is benign. The second in the UIQ is ADH involving a papilloma bordering on DCIS. She had three biopsies and other is benign concordant. She has no mass or dc. We discussed excisional biopsy of two sites.   Procedure: After informed consent was obtained she was taken to the operating room.  She had seeds placed prior to beginning.  I had these mammograms in the operating room.  She was given antibiotics.  SCDs were placed.  She was placed under general anesthesia without complication.  She was prepped and draped in a standard sterile surgical fashion.  A surgical timeout was then performed.   I located the seeds in the right uiq and loq. I infiltrated Marcaine  throughout this area.  I then made a periareolar incision in order to hide the scar later.  I dissected to the first seed using the probe for guidance.  I then removed the seed and some of the surrounding tissue.  Mammogram confirmed removal of the seed and the clip.  I then obtained hemostasis. I then went to the LOQ seed.  I removed this but seed came out of biopsy cavity. This was close to the skin also.  Mammogram confirmed removal of the seed. I closed the breast tissue with 2-0 Vicryl.  The skin was closed with 3-0 Vicryl  and 5-0 Monocryl.  Glue and Steri-Strips were applied.  She tolerated this well was extubated transferred recovery stable.

## 2024-12-10 ENCOUNTER — Encounter (HOSPITAL_COMMUNITY): Payer: Self-pay | Admitting: General Surgery

## 2024-12-11 NOTE — Anesthesia Postprocedure Evaluation (Signed)
"   Anesthesia Post Note  Patient: Felicia Shelton  Procedure(s) Performed: RADIOACTIVE SEED GUIDED BREAST BIOPSY (Right: Breast)     Patient location during evaluation: PACU Anesthesia Type: General Level of consciousness: awake and alert Pain management: pain level controlled Vital Signs Assessment: post-procedure vital signs reviewed and stable Respiratory status: spontaneous breathing, nonlabored ventilation, respiratory function stable and patient connected to nasal cannula oxygen Cardiovascular status: blood pressure returned to baseline and stable Postop Assessment: no apparent nausea or vomiting Anesthetic complications: no   No notable events documented.  Last Vitals:  Vitals:   12/09/24 1030 12/09/24 1045  BP: 120/68 124/64  Pulse: (!) 57 (!) 59  Resp: 12 18  Temp:  36.4 C  SpO2: 98% 100%    Last Pain:  Vitals:   12/09/24 1045  TempSrc:   PainSc: 3                  [name]      "

## 2024-12-15 LAB — SURGICAL PATHOLOGY

## 2024-12-23 ENCOUNTER — Encounter: Payer: Self-pay | Admitting: Podiatry

## 2024-12-23 ENCOUNTER — Ambulatory Visit: Admitting: Podiatry

## 2024-12-23 DIAGNOSIS — E1142 Type 2 diabetes mellitus with diabetic polyneuropathy: Secondary | ICD-10-CM

## 2024-12-23 DIAGNOSIS — B351 Tinea unguium: Secondary | ICD-10-CM

## 2024-12-23 NOTE — Patient Instructions (Signed)
 Look for urea 40% cream or ointment and apply to the thickened dry skin / calluses. This can be bought over the counter, at a pharmacy or online such as [employer].

## 2024-12-23 NOTE — Progress Notes (Signed)
"  °  Subjective:  Patient ID: Felicia Shelton, female    DOB: 1941-05-07,  MRN: [id number]  Chief Complaint  Patient presents with   [facility]    [facility] nail trim  A1c 6.20 Mar 2024. No anti coag    84 y.o. female presents with the above complaint. History confirmed with patient. Patient presenting with pain related to dystrophic thickened elongated nails. Patient is unable to trim own nails related to nail dystrophy and/or mobility issues. Patient does have a history of T2DM.  He does complain of noticing some worsening dry rough skin around heels.  She currently uses [employer] after foot soaks and covers with Saran wrap.  Objective:  Physical Exam: warm, good capillary refill, absent pedal hair growth, pedal skin atrophic. nail exam onychomycosis of the toenails, onycholysis, and dystrophic nails greater than 3 mm thickening and tender on direct dorsal palpation. DP pulses faintly palpable, PT pulses faintly palpable, and protective sensation intact, subjective neuropathy symptoms Left Foot:  Pain with palpation of nails due to elongation and dystrophic growth.  Right Foot: Pain with palpation of nails due to elongation and dystrophic growth.  Prior fifth toenail nail avulsion. Mildly xerotic skin around heels noted bilaterally.  Assessment:   1. Diabetic polyneuropathy associated with type 2 diabetes mellitus (HCC)   2. Pain due to onychomycosis of toenails of both feet      Plan:  Patient was evaluated and treated and all questions answered.  #Onychomycosis with pain  -Nails palliatively debrided as below. -Educated on self-care - Diabetes with neuropathy  Procedure: Nail Debridement Rationale: Pain Type of Debridement: manual, sharp debridement. Instrumentation: Nail nipper, rotary burr. Number of Nails: 9  Patient educated on diabetes. Discussed proper diabetic foot care and discussed risks and complications of disease. Educated patient in depth on reasons to return to the office  immediately should he/she discover anything concerning or new on the feet. All questions answered. Discussed proper shoes as well.   Regarding xerotic skin around heels, sounds as though she currently manages this well.  Did suggest that she can try 40% urea cream as alternative or try foot miracle cream.  Return in about 3 months (around 03/22/2025) for Diabetic Foot Care.         Ethan Saddler, DPM [facility] / [facility]                   "

## 2025-01-04 ENCOUNTER — Ambulatory Visit: Admitting: Neurology

## 2025-03-24 ENCOUNTER — Ambulatory Visit: Admitting: Podiatry
